# Patient Record
Sex: Male | Born: 1960
Health system: Southern US, Community
[De-identification: ages and names within clinical notes are randomized; demographics above are authoritative.]

## PROBLEM LIST (undated history)

## (undated) DIAGNOSIS — M25562 Pain in left knee: Secondary | ICD-10-CM

## (undated) DIAGNOSIS — M5136 Other intervertebral disc degeneration, lumbar region: Secondary | ICD-10-CM

## (undated) DIAGNOSIS — G4730 Sleep apnea, unspecified: Secondary | ICD-10-CM

## (undated) DIAGNOSIS — M25559 Pain in unspecified hip: Secondary | ICD-10-CM

## (undated) DIAGNOSIS — M51369 Other intervertebral disc degeneration, lumbar region without mention of lumbar back pain or lower extremity pain: Secondary | ICD-10-CM

## (undated) DIAGNOSIS — M5431 Sciatica, right side: Secondary | ICD-10-CM

## (undated) DIAGNOSIS — Z9884 Bariatric surgery status: Secondary | ICD-10-CM

## (undated) DIAGNOSIS — G8929 Other chronic pain: Secondary | ICD-10-CM

## (undated) DIAGNOSIS — G4700 Insomnia, unspecified: Secondary | ICD-10-CM

## (undated) DIAGNOSIS — E785 Hyperlipidemia, unspecified: Secondary | ICD-10-CM

## (undated) DIAGNOSIS — I10 Essential (primary) hypertension: Secondary | ICD-10-CM

## (undated) DIAGNOSIS — E119 Type 2 diabetes mellitus without complications: Secondary | ICD-10-CM

## (undated) DIAGNOSIS — M549 Dorsalgia, unspecified: Secondary | ICD-10-CM

## (undated) DIAGNOSIS — E559 Vitamin D deficiency, unspecified: Secondary | ICD-10-CM

## (undated) HISTORY — DX: Type 2 diabetes mellitus without complications: E11.9

## (undated) HISTORY — DX: Dorsalgia, unspecified: M54.9

## (undated) HISTORY — DX: Hyperlipidemia, unspecified: E78.5

## (undated) HISTORY — DX: Vitamin D deficiency, unspecified: E55.9

## (undated) HISTORY — DX: Insomnia, unspecified: G47.00

## (undated) HISTORY — DX: Other intervertebral disc degeneration, lumbar region: M51.36

## (undated) HISTORY — DX: Essential (primary) hypertension: I10

## (undated) HISTORY — PX: OTHER SURGICAL HISTORY: SHX169

## (undated) HISTORY — DX: Other intervertebral disc degeneration, lumbar region without mention of lumbar back pain or lower extremity pain: M51.369

## (undated) HISTORY — DX: Pain in left knee: M25.562

## (undated) HISTORY — DX: Other chronic pain: G89.29

## (undated) HISTORY — DX: Pain in unspecified hip: M25.559

---

## 2012-02-05 DIAGNOSIS — K861 Other chronic pancreatitis: Secondary | ICD-10-CM | POA: Insufficient documentation

## 2012-02-05 DIAGNOSIS — E119 Type 2 diabetes mellitus without complications: Secondary | ICD-10-CM | POA: Insufficient documentation

## 2012-02-12 DIAGNOSIS — I509 Heart failure, unspecified: Secondary | ICD-10-CM | POA: Insufficient documentation

## 2012-06-04 DIAGNOSIS — M48061 Spinal stenosis, lumbar region without neurogenic claudication: Secondary | ICD-10-CM | POA: Insufficient documentation

## 2013-10-06 DIAGNOSIS — Z9889 Other specified postprocedural states: Secondary | ICD-10-CM | POA: Insufficient documentation

## 2013-12-16 DIAGNOSIS — N529 Male erectile dysfunction, unspecified: Secondary | ICD-10-CM | POA: Insufficient documentation

## 2014-02-05 DIAGNOSIS — G4733 Obstructive sleep apnea (adult) (pediatric): Secondary | ICD-10-CM | POA: Insufficient documentation

## 2014-05-08 DIAGNOSIS — M5416 Radiculopathy, lumbar region: Secondary | ICD-10-CM | POA: Insufficient documentation

## 2014-05-25 DIAGNOSIS — I6381 Other cerebral infarction due to occlusion or stenosis of small artery: Secondary | ICD-10-CM | POA: Insufficient documentation

## 2014-06-16 DIAGNOSIS — M2010 Hallux valgus (acquired), unspecified foot: Secondary | ICD-10-CM | POA: Insufficient documentation

## 2014-06-16 DIAGNOSIS — B351 Tinea unguium: Secondary | ICD-10-CM | POA: Insufficient documentation

## 2014-06-16 DIAGNOSIS — L602 Onychogryphosis: Secondary | ICD-10-CM | POA: Insufficient documentation

## 2014-06-16 DIAGNOSIS — M21619 Bunion of unspecified foot: Secondary | ICD-10-CM

## 2014-06-16 DIAGNOSIS — M79673 Pain in unspecified foot: Secondary | ICD-10-CM | POA: Insufficient documentation

## 2014-06-19 HISTORY — PX: JOINT REPLACEMENT: SHX530

## 2015-02-01 DIAGNOSIS — M169 Osteoarthritis of hip, unspecified: Secondary | ICD-10-CM | POA: Insufficient documentation

## 2015-02-26 DIAGNOSIS — M2340 Loose body in knee, unspecified knee: Secondary | ICD-10-CM | POA: Insufficient documentation

## 2015-04-27 ENCOUNTER — Ambulatory Visit (INDEPENDENT_AMBULATORY_CARE_PROVIDER_SITE_OTHER): Payer: Medicare Other | Admitting: Family Medicine

## 2015-04-27 ENCOUNTER — Encounter: Payer: Self-pay | Admitting: Family Medicine

## 2015-04-27 VITALS — BP 116/74 | HR 65 | Temp 97.9°F | Ht 72.3 in | Wt 279.0 lb

## 2015-04-27 DIAGNOSIS — M5136 Other intervertebral disc degeneration, lumbar region: Secondary | ICD-10-CM

## 2015-04-27 DIAGNOSIS — E1165 Type 2 diabetes mellitus with hyperglycemia: Secondary | ICD-10-CM | POA: Diagnosis not present

## 2015-04-27 DIAGNOSIS — R202 Paresthesia of skin: Secondary | ICD-10-CM | POA: Diagnosis not present

## 2015-04-27 DIAGNOSIS — E669 Obesity, unspecified: Secondary | ICD-10-CM | POA: Insufficient documentation

## 2015-04-27 DIAGNOSIS — M25561 Pain in right knee: Secondary | ICD-10-CM | POA: Insufficient documentation

## 2015-04-27 DIAGNOSIS — E785 Hyperlipidemia, unspecified: Secondary | ICD-10-CM | POA: Insufficient documentation

## 2015-04-27 DIAGNOSIS — M25562 Pain in left knee: Secondary | ICD-10-CM

## 2015-04-27 DIAGNOSIS — D72829 Elevated white blood cell count, unspecified: Secondary | ICD-10-CM

## 2015-04-27 DIAGNOSIS — I10 Essential (primary) hypertension: Secondary | ICD-10-CM | POA: Diagnosis not present

## 2015-04-27 LAB — BAYER DCA HB A1C WAIVED: HB A1C: 7 % — AB (ref <7.0)

## 2015-04-27 LAB — MICROALBUMIN, URINE WAIVED
Creatinine, Urine Waived: 200 mg/dL (ref 10–300)
Microalb, Ur Waived: 10 mg/L (ref 0–19)
Microalb/Creat Ratio: <30 mg/g (ref <30)

## 2015-04-27 NOTE — Assessment & Plan Note (Signed)
Checking levels. Await results. Continue current regimen.

## 2015-04-27 NOTE — Assessment & Plan Note (Signed)
Continue diet and exercise. Continue to monitor.  

## 2015-04-27 NOTE — Assessment & Plan Note (Signed)
A1c came back today at 7.0! Continue current regimen. Keep up the good work. Recheck in 3 months.

## 2015-04-27 NOTE — Progress Notes (Signed)
BP 116/74 mmHg  Pulse 65  Temp(Src) 97.9 F (36.6 C)  Ht 6' 0.3" (1.836 m)  Wt 279 lb (126.554 kg)  BMI 37.54 kg/m2  SpO2 98%   Subjective:    Patient ID: Rickey Glover, male    DOB: 1960/10/21, 54 y.o.   MRN: [id number]  HPI: Rickey Glover is a 54 y.o. male  Chief Complaint  Patient presents with  . Back Pain    Patient would like to be sent to a specialist, he had all of the surgeries scheduled but since he moved he has to start over  . Diabetes   HYPERTENSION / HYPERLIPIDEMIA- has had both for a long time Satisfied with current treatment? 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 Had been seeing spine specialist, orthopedist- had been getting set up with them, but hadn't seen them yet Duration: chronic, fell down some steps many years ago Mechanism of injury: Fall many years ago Location: R>L, bilateral and low back Onset: gradual Severity: severe Quality: dull and aching Frequency: constant Radiation: R leg below the knee and L leg below the knee Aggravating factors: lifting, movement, walking and prolonged  sitting Alleviating factors: rest, ice, heat and narcotics Status: stable Treatments attempted: rest, ice, heat, APAP, ibuprofen, aleve and physical therapy  Relief with NSAIDs?: no Nighttime pain:  yes Paresthesias / decreased sensation:  yes Bowel / bladder incontinence:  no Fevers:  no Dysuria / urinary frequency:  no  Relevant past medical, surgical, family and social history reviewed and updated as indicated. Interim medical history since our last visit reviewed. Allergies and medications reviewed and updated.  Review of Systems  Respiratory: Negative.   Cardiovascular: Negative.   Gastrointestinal: Negative.   Musculoskeletal: Positive for myalgias, back pain and gait problem. Negative for joint swelling, arthralgias, neck pain and neck stiffness.  Neurological: Positive for weakness and numbness. Negative for dizziness, tremors, seizures, syncope, facial asymmetry, speech difficulty, light-headedness and headaches.  Psychiatric/Behavioral: Negative.     Per HPI unless specifically indicated above     Objective:    BP 116/74 mmHg  Pulse 65  Temp(Src) 97.9 F (36.6 C)  Ht 6' 0.3" (1.836 m)  Wt 279 lb (126.554 kg)  BMI 37.54 kg/m2  SpO2 98%  Wt Readings from Last 3 Encounters:  04/27/15 279 lb (126.554 kg)    Physical Exam  Constitutional: He is oriented to person, place, and time. He appears well-developed and well-nourished. No distress.  HENT:  Head: Normocephalic and atraumatic.  Right Ear: Hearing normal.  Left Ear: Hearing normal.  Nose: Nose normal.  Eyes: Conjunctivae, EOM and lids are normal. Pupils are equal, round, and reactive to light. Right eye exhibits no discharge. Left eye exhibits no discharge. No scleral icterus.  Cardiovascular: Normal rate, regular rhythm, normal heart sounds  and intact distal pulses.  Exam reveals no gallop and no friction rub.   No murmur heard. Pulmonary/Chest: Effort normal and breath sounds normal. No respiratory  distress. He has no wheezes. He has no rales. He exhibits no tenderness.  Musculoskeletal: Normal range of motion.  Neurological: He is alert and oriented to person, place, and time. He has normal reflexes. He displays normal reflexes. No cranial nerve deficit. He exhibits normal muscle tone. Coordination normal.  Skin: Skin is warm, dry and intact. No rash noted. No erythema. No pallor.  Psychiatric: He has a normal mood and affect. His speech is normal and behavior is normal. Judgment and thought content normal. Cognition and memory are normal.  Nursing note and vitals reviewed. Back Exam:    Inspection:  Normal spinal curvature.  No deformity, ecchymosis, erythema, or lesions     Palpation:     Midline spinal tenderness: no      Paralumbar tenderness: yes R>L     Parathoracic tenderness: no      Buttocks tenderness: yesR>L     Range of Motion:      Flexion: Fingers to Knees     Extension:Decreased     Lateral bending:Decreased    Rotation:Decreased    Neuro Exam:Lower extremity DTRs normal & symmetric.  Strength and sensation intact.    Special Tests:      Straight leg raise:positive on the R   No results found for this or any previous visit.    Assessment & Plan:   Problem List Items Addressed This Visit      Cardiovascular and Mediastinum   Essential hypertension    Well controlled today. Continue current regimen. Continue to monitor.       Relevant Medications   spironolactone (ALDACTONE) 50 MG tablet   quinapril (ACCUPRIL) 40 MG tablet   furosemide (LASIX) 40 MG tablet   carvedilol (COREG) 12.5 MG tablet   amLODipine (NORVASC) 10 MG tablet   atorvastatin (LIPITOR) 80 MG tablet   Other Relevant Orders   Comprehensive metabolic panel   Microalbumin, Urine Waived     Endocrine   Type 2 diabetes mellitus with hyperglycemia, without long-term current use of insulin (HCC) - Primary    A1c came back today at 7.0! Continue current regimen. Keep up the good work. Recheck  in 3 months.       Relevant Medications   quinapril (ACCUPRIL) 40 MG tablet   metFORMIN (GLUCOPHAGE-XR) 500 MG 24 hr tablet   atorvastatin (LIPITOR) 80 MG tablet   Other Relevant Orders   Ambulatory referral to Ophthalmology   Ambulatory referral to Podiatry   CBC with Differential/Platelet   Comprehensive metabolic panel   Bayer DCA Hb A1c Waived     Musculoskeletal and Integument   DDD (degenerative disc disease), lumbar    Referral to spine doctor made today. Will obtain records from his previous doctor. Discussed that I cannot prescribe controlled substances on the first visit.       Relevant Medications   HYDROcodone-acetaminophen (NORCO) 10-325 MG tablet   naproxen (NAPROSYN) 500 MG tablet   Other Relevant Orders   Ambulatory referral to Orthopedic Surgery   Ambulatory referral to Spine Surgery     Other   Left knee pain    Has arthritis. Was to have knee replacement before he moved. Referral to orthopedics made. Obtain records from previous provider      Relevant Orders   Ambulatory referral to Orthopedic Surgery   HLD (hyperlipidemia)  Checking levels. Await results. Continue current regimen.       Relevant Medications   spironolactone (ALDACTONE) 50 MG tablet   quinapril (ACCUPRIL) 40 MG tablet   furosemide (LASIX) 40 MG tablet   carvedilol (COREG) 12.5 MG tablet   amLODipine (NORVASC) 10 MG tablet   atorvastatin (LIPITOR) 80 MG tablet   Other Relevant Orders   Comprehensive metabolic panel   Lipid Panel w/o Chol/HDL Ratio   Obesity    Continue diet and exercise. Continue to monitor.       Relevant Medications   metFORMIN (GLUCOPHAGE-XR) 500 MG 24 hr tablet   Other Relevant Orders   TSH    Other Visit Diagnoses    Paresthesias        Will check lab work and see what previous records show.     Relevant Orders    Ambulatory referral to Spine Surgery    TSH        Follow up plan: Return for Records Release, 1 month follow up.

## 2015-04-27 NOTE — Assessment & Plan Note (Signed)
Referral to spine doctor made today. Will obtain records from his previous doctor. Discussed that I cannot prescribe controlled substances on the first visit.

## 2015-04-27 NOTE — Assessment & Plan Note (Signed)
Has arthritis. Was to have knee replacement before he moved. Referral to orthopedics made. Obtain records from previous provider

## 2015-04-27 NOTE — Assessment & Plan Note (Signed)
Well controlled today. Continue current regimen. Continue to monitor.

## 2015-04-28 LAB — CBC WITH DIFFERENTIAL/PLATELET
BASOS ABS: 0 10*3/uL (ref 0.0–0.2)
Basos: 0 %
EOS (ABSOLUTE): 0.3 10*3/uL (ref 0.0–0.4)
Eos: 2 %
HEMATOCRIT: 40.9 % (ref 37.5–51.0)
Hemoglobin: 14.1 g/dL (ref 12.6–17.7)
Immature Grans (Abs): 0 10*3/uL (ref 0.0–0.1)
Immature Granulocytes: 0 %
LYMPHS: 30 %
Lymphocytes Absolute: 3.6 10*3/uL — ABNORMAL HIGH (ref 0.7–3.1)
MCH: 29.9 pg (ref 26.6–33.0)
MCHC: 34.5 g/dL (ref 31.5–35.7)
MCV: 87 fL (ref 79–97)
MONOS ABS: 0.6 10*3/uL (ref 0.1–0.9)
Monocytes: 5 %
NEUTROS ABS: 7.4 10*3/uL — AB (ref 1.4–7.0)
NEUTROS PCT: 63 %
Platelets: 280 10*3/uL (ref 150–379)
RBC: 4.72 x10E6/uL (ref 4.14–5.80)
RDW: 13.9 % (ref 12.3–15.4)
WBC: 12 10*3/uL — ABNORMAL HIGH (ref 3.4–10.8)

## 2015-04-28 LAB — COMPREHENSIVE METABOLIC PANEL
A/G RATIO: 2.1 (ref 1.1–2.5)
ALK PHOS: 64 IU/L (ref 39–117)
ALT: 10 IU/L (ref 0–44)
AST: 11 IU/L (ref 0–40)
Albumin: 4.5 g/dL (ref 3.5–5.5)
BILIRUBIN TOTAL: 0.5 mg/dL (ref 0.0–1.2)
BUN/Creatinine Ratio: 15 (ref 9–20)
BUN: 15 mg/dL (ref 6–24)
CALCIUM: 9.5 mg/dL (ref 8.7–10.2)
CHLORIDE: 103 mmol/L (ref 97–106)
CO2: 23 mmol/L (ref 18–29)
Creatinine, Ser: 1.03 mg/dL (ref 0.76–1.27)
GFR calc Af Amer: 96 mL/min/{1.73_m2} (ref >59)
GFR, EST NON AFRICAN AMERICAN: 83 mL/min/{1.73_m2} (ref >59)
GLOBULIN, TOTAL: 2.1 g/dL (ref 1.5–4.5)
Glucose: 160 mg/dL — ABNORMAL HIGH (ref 65–99)
POTASSIUM: 4.4 mmol/L (ref 3.5–5.2)
SODIUM: 143 mmol/L (ref 136–144)
Total Protein: 6.6 g/dL (ref 6.0–8.5)

## 2015-04-28 LAB — LIPID PANEL W/O CHOL/HDL RATIO
Cholesterol, Total: 172 mg/dL (ref 100–199)
HDL: 43 mg/dL (ref >39)
LDL Calculated: 98 mg/dL (ref 0–99)
TRIGLYCERIDES: 154 mg/dL — AB (ref 0–149)
VLDL Cholesterol Cal: 31 mg/dL (ref 5–40)

## 2015-04-28 LAB — TSH: TSH: 0.932 u[IU]/mL (ref 0.450–4.500)

## 2015-04-29 ENCOUNTER — Encounter: Payer: Self-pay | Admitting: Family Medicine

## 2015-04-29 DIAGNOSIS — D72829 Elevated white blood cell count, unspecified: Secondary | ICD-10-CM | POA: Insufficient documentation

## 2015-04-29 NOTE — Addendum Note (Signed)
Addended by: Valerie Roys on: 04/29/2015 12:10 PM   Modules accepted: Orders

## 2015-04-30 ENCOUNTER — Encounter: Payer: Self-pay | Admitting: Family Medicine

## 2015-04-30 ENCOUNTER — Ambulatory Visit (INDEPENDENT_AMBULATORY_CARE_PROVIDER_SITE_OTHER): Payer: Medicare Other | Admitting: Family Medicine

## 2015-04-30 ENCOUNTER — Encounter (INDEPENDENT_AMBULATORY_CARE_PROVIDER_SITE_OTHER): Payer: Self-pay

## 2015-04-30 VITALS — BP 115/72 | HR 73 | Temp 97.6°F | Ht 73.0 in | Wt 282.4 lb

## 2015-04-30 DIAGNOSIS — M5136 Other intervertebral disc degeneration, lumbar region: Secondary | ICD-10-CM

## 2015-04-30 DIAGNOSIS — G4700 Insomnia, unspecified: Secondary | ICD-10-CM

## 2015-04-30 MED ORDER — HYDROCODONE-ACETAMINOPHEN 10-325 MG PO TABS: 1.0000 | ORAL_TABLET | Freq: Four times a day (QID) | ORAL | Status: DC | PRN

## 2015-04-30 MED ORDER — CLONAZEPAM 0.5 MG PO TABS: 0.5000 mg | ORAL_TABLET | Freq: Every day | ORAL | Status: DC

## 2015-04-30 NOTE — Assessment & Plan Note (Signed)
Records from [place] reviewed in care everywhere. Records in PMP reviewed and appropriate. Chronic lumbar radiculopathy. Has been referred to spine surgery already. We will take over his norco for now. Will not be increased. Controlled substance agreement signed today. Plan on continuing medication until plan with orthopedics established. He is aware. Rx written today.

## 2015-04-30 NOTE — Progress Notes (Signed)
BP 115/72 mmHg  Pulse 73  Temp(Src) 97.6 F (36.4 C)  Ht 6\' 1"  (1.854 m)  Wt 282 lb 6.4 oz (128.096 kg)  BMI 37.27 kg/m2  SpO2 97%   Subjective:    Patient ID: Rickey Glover, male    DOB: March 18, 1961, 54 y.o.   MRN: [id number]  HPI: Eriberto Glover is a 54 y.o. male  Chief Complaint  Patient presents with  . Follow-up    Medication   INSOMNIA- didn't like how Lorrin Mais made him feel. Started on xanax, but warned that it is very addictive. Not doing good sleep hygiene. Taking the xanax, but needed to start taking 2 of them a night Duration: chronic- about 10 years or so Satisfied with sleep quality: no Difficulty falling asleep: no Difficulty staying asleep: yes Waking a few hours after sleep onset: yes Early morning awakenings: yes Daytime hypersomnolence: yes Wakes feeling refreshed: no Good sleep hygiene: yes Apnea: yes Snoring: yes Depressed/anxious mood: no Recent stress: yes Restless legs/nocturnal leg cramps: no Chronic pain/arthritis: yes History of sleep study: no Treatments attempted: melatonin, uinsom, benadryl and ambien, trazodone   BACK PAIN- has known DDD in his back and into his hip and needs surgery on the R knee. Had been seeing spine specialist, orthopedist- had been getting set up with them, but hadn't seen them yet Duration: chronic, fell down some steps many years ago Mechanism of injury: Fall many years ago Location: R>L, bilateral and low back Onset: gradual Severity: severe Quality: dull and aching Frequency: constant Radiation: R leg below the knee and L leg below the knee Aggravating factors: lifting, movement, walking and prolonged sitting Alleviating factors: rest, ice, heat and narcotics Status: stable Treatments attempted: rest, ice, heat, APAP, ibuprofen, aleve and physical therapy  Relief with NSAIDs?: no Nighttime pain: yes Paresthesias / decreased sensation: yes Bowel / bladder incontinence: no Fevers: no Dysuria /  urinary frequency: no  Relevant past medical, surgical, family and social history reviewed and updated as indicated. Interim medical history since our last visit reviewed. Allergies and medications reviewed and updated.  Review of Systems  Constitutional: Negative.   Respiratory: Negative.   Cardiovascular: Negative.   Gastrointestinal: Negative.   Musculoskeletal: Positive for myalgias, back pain, joint swelling, arthralgias, gait problem, neck pain and neck stiffness.  Psychiatric/Behavioral: Positive for sleep disturbance and decreased concentration. Negative for suicidal ideas, hallucinations, behavioral problems, confusion, self-injury, dysphoric mood and agitation. The patient is not nervous/anxious and is not hyperactive.     Per HPI unless specifically indicated above     Objective:    BP 115/72 mmHg  Pulse 73  Temp(Src) 97.6 F (36.4 C)  Ht 6\' 1"  (1.854 m)  Wt 282 lb 6.4 oz (128.096 kg)  BMI 37.27 kg/m2  SpO2 97%  Wt Readings from Last 3 Encounters:  04/30/15 282 lb 6.4 oz (128.096 kg)  04/27/15 279 lb (126.554 kg)    Physical Exam  Constitutional: He is oriented to person, place, and time. He appears well-developed and well-nourished. No distress.  HENT:  Head: Normocephalic and atraumatic.  Right Ear: Hearing normal.  Left Ear: Hearing normal.  Nose: Nose normal.  Eyes: Conjunctivae and lids are normal. Right eye exhibits no discharge. Left eye exhibits no discharge. No scleral icterus.  Cardiovascular: Normal rate, regular rhythm, normal heart sounds and intact distal pulses.  Exam reveals no gallop and no friction rub.   No murmur heard. Pulmonary/Chest: Effort normal and breath sounds normal. No respiratory distress. He has no wheezes. He  has no rales. He exhibits no tenderness.  Musculoskeletal: Normal range of motion.  Neurological: He is alert and oriented to person, place, and time. He displays normal reflexes. No cranial nerve deficit. He exhibits normal  muscle tone. Coordination normal.  Skin: Skin is warm, dry and intact. No rash noted. No erythema. No pallor.  Psychiatric: He has a normal mood and affect. His speech is normal and behavior is normal. Judgment and thought content normal. Cognition and memory are normal.  Nursing note and vitals reviewed.   Results for orders placed or performed in visit on 04/27/15  CBC with Differential/Platelet  Result Value Ref Range   WBC 12.0 (H) 3.4 - 10.8 x10E3/uL   RBC 4.72 4.14 - 5.80 x10E6/uL   Hemoglobin 14.1 12.6 - 17.7 g/dL   Hematocrit 40.9 37.5 - 51.0 %   MCV 87 79 - 97 fL   MCH 29.9 26.6 - 33.0 pg   MCHC 34.5 31.5 - 35.7 g/dL   RDW 13.9 12.3 - 15.4 %   Platelets 280 150 - 379 x10E3/uL   Neutrophils 63 %   Lymphs 30 %   Monocytes 5 %   Eos 2 %   Basos 0 %   Neutrophils Absolute 7.4 (H) 1.4 - 7.0 x10E3/uL   Lymphocytes Absolute 3.6 (H) 0.7 - 3.1 x10E3/uL   Monocytes Absolute 0.6 0.1 - 0.9 x10E3/uL   EOS (ABSOLUTE) 0.3 0.0 - 0.4 x10E3/uL   Basophils Absolute 0.0 0.0 - 0.2 x10E3/uL   Immature Granulocytes 0 %   Immature Grans (Abs) 0.0 0.0 - 0.1 x10E3/uL  Comprehensive metabolic panel  Result Value Ref Range   Glucose 160 (H) 65 - 99 mg/dL   BUN 15 6 - 24 mg/dL   Creatinine, Ser 1.03 0.76 - 1.27 mg/dL   GFR calc non Af Amer 83 >59 mL/min/1.73   GFR calc Af Amer 96 >59 mL/min/1.73   BUN/Creatinine Ratio 15 9 - 20   Sodium 143 136 - 144 mmol/L   Potassium 4.4 3.5 - 5.2 mmol/L   Chloride 103 97 - 106 mmol/L   CO2 23 18 - 29 mmol/L   Calcium 9.5 8.7 - 10.2 mg/dL   Total Protein 6.6 6.0 - 8.5 g/dL   Albumin 4.5 3.5 - 5.5 g/dL   Globulin, Total 2.1 1.5 - 4.5 g/dL   Albumin/Globulin Ratio 2.1 1.1 - 2.5   Bilirubin Total 0.5 0.0 - 1.2 mg/dL   Alkaline Phosphatase 64 39 - 117 IU/L   AST 11 0 - 40 IU/L   ALT 10 0 - 44 IU/L  Bayer DCA Hb A1c Waived  Result Value Ref Range   Bayer DCA Hb A1c Waived 7.0 (H) <7.0 %  Lipid Panel w/o Chol/HDL Ratio  Result Value Ref Range    Cholesterol, Total 172 100 - 199 mg/dL   Triglycerides 154 (H) 0 - 149 mg/dL   HDL 43 >39 mg/dL   VLDL Cholesterol Cal 31 5 - 40 mg/dL   LDL Calculated 98 0 - 99 mg/dL  Microalbumin, Urine Waived  Result Value Ref Range   Microalb, Ur Waived 10 0 - 19 mg/L   Creatinine, Urine Waived 200 10 - 300 mg/dL   Microalb/Creat Ratio <30 <30 mg/g  TSH  Result Value Ref Range   TSH 0.932 0.450 - 4.500 uIU/mL      Prescription monitoring program reviewed and appropriate. Reference   Assessment & Plan:   Problem List Items Addressed This Visit      Musculoskeletal  and Integument   DDD (degenerative disc disease), lumbar    Records from [place] reviewed in care everywhere. Records in PMP reviewed and appropriate. Chronic lumbar radiculopathy. Has been referred to spine surgery already. We will take over his norco for now. Will not be increased. Controlled substance agreement signed today. Plan on continuing medication until plan with orthopedics established. He is aware. Rx written today.        Other   Insomnia - Primary    Has tried just about everything per records. Records at [facility] reviewed through care everywhere. Has been increasing his xanax at night to help sleep. Concern about tolerance. Will change to klonapin for slower release and hopefully less concern about addiction. Discussed that he should try to take it only when he really needs it as it can still be very addictive. Work on Publishing rights manager. Controlled substance agreement signed today. Rx given. Follow up in 1 month.           Follow up plan: Return in about 4 weeks (around 05/28/2015) for follow up insomnia and back/knee pain.

## 2015-04-30 NOTE — Assessment & Plan Note (Signed)
Has tried just about everything per records. Records at [facility] reviewed through care everywhere. Has been increasing his xanax at night to help sleep. Concern about tolerance. Will change to klonapin for slower release and hopefully less concern about addiction. Discussed that he should try to take it only when he really needs it as it can still be very addictive. Work on Publishing rights manager. Controlled substance agreement signed today. Rx given. Follow up in 1 month.

## 2015-05-04 DIAGNOSIS — M1712 Unilateral primary osteoarthritis, left knee: Secondary | ICD-10-CM | POA: Diagnosis not present

## 2015-05-04 DIAGNOSIS — M1611 Unilateral primary osteoarthritis, right hip: Secondary | ICD-10-CM | POA: Diagnosis not present

## 2015-05-04 DIAGNOSIS — M1711 Unilateral primary osteoarthritis, right knee: Secondary | ICD-10-CM | POA: Diagnosis not present

## 2015-05-25 ENCOUNTER — Ambulatory Visit: Payer: Self-pay | Admitting: Sports Medicine

## 2015-05-26 ENCOUNTER — Ambulatory Visit: Payer: Medicare Other | Admitting: Family Medicine

## 2015-05-27 ENCOUNTER — Encounter: Payer: Self-pay | Admitting: Family Medicine

## 2015-05-27 ENCOUNTER — Ambulatory Visit (INDEPENDENT_AMBULATORY_CARE_PROVIDER_SITE_OTHER): Payer: Medicare Other | Admitting: Family Medicine

## 2015-05-27 VITALS — BP 116/78 | HR 69 | Temp 98.6°F | Wt 284.0 lb

## 2015-05-27 DIAGNOSIS — G4700 Insomnia, unspecified: Secondary | ICD-10-CM | POA: Diagnosis not present

## 2015-05-27 DIAGNOSIS — M1711 Unilateral primary osteoarthritis, right knee: Secondary | ICD-10-CM | POA: Diagnosis not present

## 2015-05-27 DIAGNOSIS — M5136 Other intervertebral disc degeneration, lumbar region: Secondary | ICD-10-CM

## 2015-05-27 DIAGNOSIS — M1611 Unilateral primary osteoarthritis, right hip: Secondary | ICD-10-CM | POA: Diagnosis not present

## 2015-05-27 DIAGNOSIS — G453 Amaurosis fugax: Secondary | ICD-10-CM | POA: Insufficient documentation

## 2015-05-27 DIAGNOSIS — M1712 Unilateral primary osteoarthritis, left knee: Secondary | ICD-10-CM | POA: Diagnosis not present

## 2015-05-27 MED ORDER — ASPIRIN-DIPYRIDAMOLE ER 25-200 MG PO CP12: ORAL_CAPSULE | ORAL | Status: DC

## 2015-05-27 MED ORDER — HYDROCODONE-ACETAMINOPHEN 10-325 MG PO TABS: 1.0000 | ORAL_TABLET | Freq: Four times a day (QID) | ORAL | Status: DC | PRN

## 2015-05-27 NOTE — Progress Notes (Signed)
BP 116/78 mmHg  Pulse 69  Temp(Src) 98.6 F (37 C)  Wt 284 lb (128.822 kg)  SpO2 98%   Subjective:    Patient ID: Rickey Glover, male    DOB: April 26, 1961, 54 y.o.   MRN: [id number]  HPI: Rickey Glover is a 54 y.o. male  Chief Complaint  Patient presents with  . Insomnia    Patient needs a refill on his klonopin  . Back Pain    Patient states that he is having to take 2 or 3 pain pills a day to be able to function  . Medication Refill    Aggrenox   Having problems with his feet and his hands going numb for se Hasn't seen the spine specialist yet- thinks that he is seeing him today  Saw the orthopedist and got a cortisone shot in his knee and that is helping a lot. Knee is not hurting any more  CHRONIC PAIN- ran out of his pain medicine yesterday. It helps, but doesn't make it go away 100%  Present dose: 40 Morphine equivalents Pain control status: stable Duration: chronic Location: low back Quality: sharp, dull, aching and burning Current Pain Level: moderate Previous Pain Level: severe Breakthrough pain: yes Benefit from narcotic medications: yes What Activities task can be accomplished with current medication?: can take care of his grand daughter Interested in weaning off narcotics:no   Stool softners/OTC fiber: no  Previous pain specialty evaluation: no Non-narcotic analgesic meds: yes Narcotic contract: yes  INSOMNIA- has been taking the sleep medicine with benadryl and that is working, 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 Interim medical history since our last visit reviewed. Allergies and medications reviewed and updated.  Review of Systems  Constitutional: Negative.   Respiratory: Negative.   Cardiovascular: Negative.   Musculoskeletal: Positive for myalgias, back pain and arthralgias. Negative for joint swelling, gait problem, neck pain and neck stiffness.  Neurological: Negative.   Psychiatric/Behavioral: Positive for sleep disturbance. Negative for suicidal ideas, hallucinations, behavioral problems, confusion, self-injury, dysphoric mood, decreased concentration and agitation. The patient is not nervous/anxious and is not hyperactive.     Per HPI unless specifically indicated above     Objective:    BP 116/78 mmHg  Pulse 69  Temp(Src) 98.6 F (37 C)  Wt 284 lb (128.822 kg)  SpO2 98%  Wt Readings from Last 3 Encounters:  05/27/15 284 lb (128.822 kg)  04/30/15 282 lb 6.4 oz (128.096 kg)  04/27/15 279 lb (126.554 kg)    Physical Exam  Constitutional: He is oriented to person, place, and time. He appears well-developed and well-nourished. No distress.  HENT:  Head: Normocephalic and atraumatic.  Right Ear: Hearing normal.  Left Ear: Hearing normal.  Nose: Nose normal.  Eyes: Conjunctivae and lids are normal. Right eye exhibits no discharge. Left eye exhibits no discharge. No scleral icterus.  Cardiovascular: Normal rate, regular rhythm, normal heart sounds and intact distal pulses.  Exam reveals no gallop and no friction rub.   No murmur heard. Pulmonary/Chest: Effort normal and  breath sounds normal. No respiratory distress. He has no wheezes. He has no rales. He exhibits no tenderness.  Musculoskeletal: Normal range of motion.  Neurological: He is alert and oriented to person, place, and time.  Skin: Skin is warm, dry and intact. No rash  noted. No erythema. No pallor.  Psychiatric: He has a normal mood and affect. His speech is normal and behavior is normal. Judgment and thought content normal. Cognition and memory are normal.  Nursing note and vitals reviewed.   Results for orders placed or performed in visit on 04/27/15  CBC with Differential/Platelet  Result Value Ref Range   WBC 12.0 (H) 3.4 - 10.8 x10E3/uL   RBC 4.72 4.14 - 5.80 x10E6/uL   Hemoglobin 14.1 12.6 - 17.7 g/dL   Hematocrit 40.9 37.5 - 51.0 %   MCV 87 79 - 97 fL   MCH 29.9 26.6 - 33.0 pg   MCHC 34.5 31.5 - 35.7 g/dL   RDW 13.9 12.3 - 15.4 %   Platelets 280 150 - 379 x10E3/uL   Neutrophils 63 %   Lymphs 30 %   Monocytes 5 %   Eos 2 %   Basos 0 %   Neutrophils Absolute 7.4 (H) 1.4 - 7.0 x10E3/uL   Lymphocytes Absolute 3.6 (H) 0.7 - 3.1 x10E3/uL   Monocytes Absolute 0.6 0.1 - 0.9 x10E3/uL   EOS (ABSOLUTE) 0.3 0.0 - 0.4 x10E3/uL   Basophils Absolute 0.0 0.0 - 0.2 x10E3/uL   Immature Granulocytes 0 %   Immature Grans (Abs) 0.0 0.0 - 0.1 x10E3/uL  Comprehensive metabolic panel  Result Value Ref Range   Glucose 160 (H) 65 - 99 mg/dL   BUN 15 6 - 24 mg/dL   Creatinine, Ser 1.03 0.76 - 1.27 mg/dL   GFR calc non Af Amer 83 >59 mL/min/1.73   GFR calc Af Amer 96 >59 mL/min/1.73   BUN/Creatinine Ratio 15 9 - 20   Sodium 143 136 - 144 mmol/L   Potassium 4.4 3.5 - 5.2 mmol/L   Chloride 103 97 - 106 mmol/L   CO2 23 18 - 29 mmol/L   Calcium 9.5 8.7 - 10.2 mg/dL   Total Protein 6.6 6.0 - 8.5 g/dL   Albumin 4.5 3.5 - 5.5 g/dL   Globulin, Total 2.1 1.5 - 4.5 g/dL   Albumin/Globulin Ratio 2.1 1.1 - 2.5   Bilirubin Total 0.5 0.0 - 1.2 mg/dL   Alkaline Phosphatase 64 39 - 117 IU/L   AST 11 0 - 40 IU/L   ALT 10 0 - 44 IU/L  Bayer DCA Hb A1c Waived  Result Value Ref Range   Bayer DCA Hb A1c Waived 7.0 (H) <7.0 %  Lipid Panel w/o Chol/HDL Ratio  Result Value Ref Range   Cholesterol, Total 172 100 - 199 mg/dL   Triglycerides 154 (H) 0 - 149 mg/dL   HDL  43 >39 mg/dL   VLDL Cholesterol Cal 31 5 - 40 mg/dL   LDL Calculated 98 0 - 99 mg/dL  Microalbumin, Urine Waived  Result Value Ref Range   Microalb, Ur Waived 10 0 - 19 mg/L   Creatinine, Urine Waived 200 10 - 300 mg/dL   Microalb/Creat Ratio <30 <30 mg/g  TSH  Result Value Ref Range   TSH 0.932 0.450 - 4.500 uIU/mL      Assessment & Plan:   Problem List Items Addressed This Visit      Cardiovascular and Mediastinum   Amaurosis fugax    To see neurology. Continue  on aggrenox. Still need records from previous providers. Continue to monitor.       Relevant Medications   aspirin 81 MG tablet   Other Relevant Orders   Ambulatory referral to Neurology     Musculoskeletal and Integument   DDD (degenerative disc disease), lumbar - Primary    Doing fair on his current regimen. Continue current regimen. Continue to monitor. Seeing spine surgeon today. To discuss further options. Continue to monitor.       Relevant Medications   aspirin 81 MG tablet   meloxicam (MOBIC) 15 MG tablet   HYDROcodone-acetaminophen (NORCO) 10-325 MG tablet     Other   Insomnia    Advised patient that he cannot take benadryl with klonapin. Will try just the benadryl. Will call if not working. Continue to monitor.           Follow up plan: Return in about 4 weeks (around 06/24/2015).

## 2015-05-28 NOTE — Assessment & Plan Note (Signed)
Doing fair on his current regimen. Continue current regimen. Continue to monitor. Seeing spine surgeon today. To discuss further options. Continue to monitor.

## 2015-05-28 NOTE — Assessment & Plan Note (Signed)
Advised patient that he cannot take benadryl with klonapin. Will try just the benadryl. Will call if not working. Continue to monitor.

## 2015-05-28 NOTE — Assessment & Plan Note (Signed)
To see neurology. Continue on aggrenox. Still need records from previous providers. Continue to monitor.

## 2015-06-02 ENCOUNTER — Telehealth: Payer: Self-pay | Admitting: Family Medicine

## 2015-06-02 MED ORDER — CLONAZEPAM 0.5 MG PO TABS: 0.5000 mg | ORAL_TABLET | Freq: Every day | ORAL | Status: DC

## 2015-06-02 NOTE — Telephone Encounter (Signed)
Pt needs a refill on sleeping meds(he didn't know the name of it) he would like it to go to [facility]. He also needs a letter to take to social security by Friday if possible to take to his appt with them on Monday stating that he does in fact need a hip and knee replacement.

## 2015-06-02 NOTE — Telephone Encounter (Signed)
Forward to provider

## 2015-06-02 NOTE — Telephone Encounter (Addendum)
Rx printed. He will need to come in to pick it up. Please let him know it's up front.

## 2015-06-02 NOTE — Telephone Encounter (Signed)
Patient states that the Benadryl is not working any longer, and that you told him to call if it stops working.

## 2015-06-02 NOTE — Addendum Note (Signed)
Addended by: Valerie Roys on: 06/02/2015 12:16 PM   Modules accepted: Orders

## 2015-06-02 NOTE — Telephone Encounter (Signed)
He told me at his appointment that he was going to take the benadryl because it worked better, which is why he didn't get a refill on his sleeping pill.   He should get that note from his orthopedist, as they are the ones who are going to be doing the surgery.

## 2015-06-06 ENCOUNTER — Ambulatory Visit: Payer: Medicare Other

## 2015-06-06 ENCOUNTER — Ambulatory Visit
Admission: EM | Admit: 2015-06-06 | Discharge: 2015-06-06 | Disposition: A | Discharge status: Home / Self Care | Payer: Medicare Other | Attending: Family Medicine | Admitting: Family Medicine

## 2015-06-06 ENCOUNTER — Encounter: Payer: Self-pay | Admitting: Emergency Medicine

## 2015-06-06 DIAGNOSIS — M129 Arthropathy, unspecified: Secondary | ICD-10-CM

## 2015-06-06 DIAGNOSIS — M25562 Pain in left knee: Secondary | ICD-10-CM | POA: Diagnosis not present

## 2015-06-06 DIAGNOSIS — M1712 Unilateral primary osteoarthritis, left knee: Secondary | ICD-10-CM

## 2015-06-06 HISTORY — DX: Bariatric surgery status: Z98.84

## 2015-06-06 MED ORDER — KETOROLAC TROMETHAMINE 60 MG/2ML IM SOLN
60.0000 mg | Freq: Once | INTRAMUSCULAR | Status: AC
  Administered 2015-06-06: 60 mg via INTRAMUSCULAR

## 2015-06-06 MED ORDER — NAPROXEN SODIUM 220 MG PO TABS: 220.0000 mg | ORAL_TABLET | Freq: Two times a day (BID) | ORAL | Status: DC

## 2015-06-06 NOTE — Discharge Instructions (Signed)
Cryotherapy  Cryotherapy is when you put ice on your injury. Ice helps lessen pain and puffiness (swelling) after an injury. Ice works the best when you start using it in the first 24 to 48 hours after an injury.  HOME CARE  · Put a dry or damp towel between the ice pack and your skin.  · You may press gently on the ice pack.  · Leave the ice on for no more than 10 to 20 minutes at a time.  · Check your skin after 5 minutes to make sure your skin is okay.  · Rest at least 20 minutes between ice pack uses.  · Stop using ice when your skin loses feeling (numbness).  · Do not use ice on someone who cannot tell you when it hurts. This includes small children and people with memory problems (dementia).  GET HELP RIGHT AWAY IF:  · You have white spots on your skin.  · Your skin turns blue or pale.  · Your skin feels waxy or hard.  · Your puffiness gets worse.  MAKE SURE YOU:   · Understand these instructions.  · Will watch your condition.  · Will get help right away if you are not doing well or get worse.     This information is not intended to replace advice given to you by your health care provider. Make sure you discuss any questions you have with your health care provider.     Document Released: 11/22/2007 Document Revised: 08/28/2011 Document Reviewed: 01/26/2011  Elsevier Interactive Patient Education ©2016 Elsevier Inc.

## 2015-06-06 NOTE — ED Notes (Signed)
Left knee pain started today. Knee has given out. Swollen and red.

## 2015-06-06 NOTE — ED Provider Notes (Signed)
CSN: [id number]     Arrival date & time 06/06/15  1127 History   First MD Initiated Contact with Patient 06/06/15 1256     Chief Complaint  Patient presents with  . Knee Pain   HPI  Rickey Glover is a pleasant 54 y.o. male with acute on chronic left knee pain.  He tells me he has chronic left knee pain and swelling. He went to [facility] [place] a month ago and had fluid drained off of his knee as well as a cortisone injection. He is planning on having upcoming right hip surgery. He gives history of arthritis. He states he was eating breakfast at [employer] this morning and he felt like his left knee gave out. He has had recent x-rays and MRIs done but I do not have access to them today. He is not taking any NSAIDs. He does take hydrocodone as needed for pain in his back and right hip. Pain is 9/10 on pain scale. Weightbearing makes pain worse. His range of motion is decreased as he does not have complete extension or flexion.     Past Medical History  Diagnosis Date  . Hypertension   . Diabetes mellitus without complication ([place])   . Hyperlipidemia   . LAP-BAND surgery status   . Knee pain, left    Past Surgical History  Procedure Laterality Date  . Lap band surgery     History reviewed. No pertinent family history. Social History  Substance Use Topics  . Smoking status: Current Every Day Smoker -- 1.00 packs/day  . Smokeless tobacco: Never Used  . Alcohol Use: No    Review of Systems  Constitutional: Positive for activity change.  HENT: Negative.   Eyes: Negative.   Respiratory: Negative.   Cardiovascular: Negative.   Gastrointestinal: Negative.   Musculoskeletal: Positive for back pain, joint swelling and arthralgias. Negative for myalgias.  Neurological: Negative.     Allergies  Percocet  Home Medications   Prior to Admission medications   Medication Sig Start Date End Date Taking? Authorizing Provider  amLODipine (NORVASC) 10 MG tablet Take 10 mg by  mouth daily.   Yes Historical Provider, MD  aspirin EC 81 MG tablet Take 81 mg by mouth daily.   Yes Historical Provider, MD  carvedilol (COREG) 12.5 MG tablet Take 12.5 mg by mouth 2 (two) times daily with a meal.   Yes Historical Provider, MD  clonazePAM (KLONOPIN) 0.5 MG tablet Take 0.5 mg by mouth 2 (two) times daily as needed for anxiety.   Yes Historical Provider, MD  dipyridamole-aspirin (AGGRENOX) 200-25 MG 12hr capsule Take 1 capsule by mouth 2 (two) times daily.   Yes Historical Provider, MD  furosemide (LASIX) 40 MG tablet Take 40 mg by mouth daily.   Yes Historical Provider, MD  gabapentin (NEURONTIN) 300 MG capsule Take 300 mg by mouth 3 (three) times daily.   Yes Historical Provider, MD  glimepiride (AMARYL) 4 MG tablet Take 4 mg by mouth 2 (two) times daily.   Yes Historical Provider, MD  HYDROcodone-acetaminophen (NORCO) 10-325 MG tablet Take 1 tablet by mouth every 6 (six) hours as needed.   Yes Historical Provider, MD  metFORMIN (GLUCOPHAGE) 500 MG tablet Take 500 mg by mouth.   Yes Historical Provider, MD  quinapril (ACCUPRIL) 40 MG tablet Take 40 mg by mouth at bedtime.   Yes Historical Provider, MD  spironolactone (ALDACTONE) 50 MG tablet Take 50 mg by mouth daily.   Yes Historical Provider, MD  naproxen sodium (  ALEVE) 220 MG tablet Take 1 tablet (220 mg total) by mouth 2 (two) times daily with a meal. 06/06/15   Andria Meuse, NP   Meds Ordered and Administered this Visit   Medications  ketorolac (TORADOL) injection 60 mg (60 mg Intramuscular Given 06/06/15 1313)    BP 98/66 mmHg  Pulse 69  Temp(Src) 97.2 F (36.2 C) (Tympanic)  Resp 18  Ht 6\' 1"  (1.854 m)  Wt 280 lb (127.007 kg)  BMI 36.95 kg/m2  SpO2 100% No data found.   Physical Exam  Constitutional: He is oriented to person, place, and time. He appears well-developed and well-nourished. No distress.  HENT:  Head: Normocephalic and atraumatic.  Eyes: Conjunctivae are normal. No scleral icterus.  Neck:  Normal range of motion.  Cardiovascular: Normal rate and regular rhythm.   Pulmonary/Chest: Effort normal and breath sounds normal. No respiratory distress.  Abdominal: Soft. Bowel sounds are normal. He exhibits no distension.  Musculoskeletal: He exhibits no edema or tenderness.       Left knee: He exhibits decreased range of motion and swelling. He exhibits no ecchymosis, no deformity, no erythema and no bony tenderness. No tenderness found. No medial joint line, no lateral joint line, no MCL, no LCL and no patellar tendon tenderness noted.  Unable to fully extend left knee.  Good equal strength & sensation.  Neurological: He is alert and oriented to person, place, and time. He has normal strength. No cranial nerve deficit or sensory deficit.  Skin: Skin is warm and dry. No rash noted. No erythema.  Psychiatric: His behavior is normal. Judgment normal.  Nursing note and vitals reviewed.   ED Course  Procedures na  MDM   1. Left knee pain   2. Arthritis of left knee    Patient has acute on chronic left knee pain and swelling. Recent cortisone injection at [facility].  He was given Toradol 60 mg IM injection which did seem to relieve his pain today. He is asked to follow-up with his orthopedist this week for further management. He has had both recent x-rays as well as MRI and I did not feel any further imaging would add to any management today. He is to remain nonweightbearing on that knee with crutches he has already at home.  Plan: Diagnosis reviewed with patient/parent/guardian/caregiver  Rx as per orders;  benefits, risks, potential side effects reviewed  Recommend supportive treatment with rest, ice, elevation, crutches Appt  Ortho early this week for re-evaluation   Andria Meuse, NP 06/06/15 1348

## 2015-06-07 ENCOUNTER — Encounter: Payer: Self-pay | Admitting: Diagnostic Neuroimaging

## 2015-06-07 ENCOUNTER — Ambulatory Visit (INDEPENDENT_AMBULATORY_CARE_PROVIDER_SITE_OTHER): Payer: Medicare Other | Admitting: Diagnostic Neuroimaging

## 2015-06-07 ENCOUNTER — Telehealth: Payer: Self-pay | Admitting: Diagnostic Neuroimaging

## 2015-06-07 VITALS — BP 104/69 | HR 65 | Ht 73.0 in | Wt 289.0 lb

## 2015-06-07 DIAGNOSIS — G4733 Obstructive sleep apnea (adult) (pediatric): Secondary | ICD-10-CM | POA: Diagnosis not present

## 2015-06-07 DIAGNOSIS — I10 Essential (primary) hypertension: Secondary | ICD-10-CM | POA: Diagnosis not present

## 2015-06-07 DIAGNOSIS — E785 Hyperlipidemia, unspecified: Secondary | ICD-10-CM

## 2015-06-07 DIAGNOSIS — G453 Amaurosis fugax: Secondary | ICD-10-CM

## 2015-06-07 DIAGNOSIS — Z720 Tobacco use: Secondary | ICD-10-CM

## 2015-06-07 DIAGNOSIS — E1165 Type 2 diabetes mellitus with hyperglycemia: Secondary | ICD-10-CM | POA: Diagnosis not present

## 2015-06-07 NOTE — Patient Instructions (Addendum)
Thank you for coming to see [place] at [facility]. I hope we have been able to provide you high quality care today.  You may receive a patient satisfaction survey over the next few weeks. We would appreciate your feedback and comments so that we may continue to improve ourselves and the health of our patients.  - I will setup additional testing - MRI brain, carotid ultrasound, echocardiogram - continue current medications - follow up with Dr. Wynetta Emery regarding high blood pressure, diabetes, high cholesterol and smoking - follow up with [facility]   ~~~~~~~~~~~~~~~~~~~~~~~~~~~~~~~~~~~~~~~~~~~~~~~~~~~~~~~~~~~~~~~~~  DR. PENUMALLI'S GUIDE TO HAPPY AND HEALTHY LIVING These are some of my general health and wellness recommendations. Some of them may apply to you better than others. Please use common sense as you try these suggestions and feel free to ask me any questions.   ACTIVITY/FITNESS Mental, social, emotional and physical stimulation are very important for brain and body health. Try learning a new activity (arts, music, language, sports, games).  Keep moving your body to the best of your abilities. You can do this at home, inside or outside, the park, community center, gym or anywhere you like. Consider a physical therapist or personal trainer to get started. Consider the app Sworkit. Fitness trackers such as smart-watches, smart-phones or Fitbits can help as well.   NUTRITION Eat more plants: colorful vegetables, nuts, seeds and berries.  Eat less sugar, salt, preservatives and processed foods.  Avoid toxins such as cigarettes and alcohol.  Drink water when you are thirsty. Warm water with a slice of lemon is an excellent morning drink to start the day.  Consider these websites for more information The Nutrition Source (https://www.henry-hernandez.biz/) Precision Nutrition (WindowBlog.ch)   RELAXATION Consider  practicing mindfulness meditation or other relaxation techniques such as deep breathing, prayer, yoga, tai chi, massage. See website mindful.org or the apps Headspace or Calm to help get started.   SLEEP Try to get at least 7-8+ hours sleep per day. Regular exercise and reduced caffeine will help you sleep better. Practice good sleep hygeine techniques. See website sleep.org for more information.   PLANNING Prepare estate planning, living will, healthcare POA documents. Sometimes this is best planned with the help of an attorney. Theconversationproject.org and agingwithdignity.org are excellent resources.

## 2015-06-07 NOTE — Progress Notes (Signed)
[facility]  PATIENT: Rickey Glover DOB: 1961-01-11  REFERRING CLINICIAN: Mellody Memos, MD HISTORY FROM: patient and fiance (Pam) REASON FOR VISIT: new consult    HISTORICAL  CHIEF COMPLAINT:  Chief Complaint  Patient presents with  . Amaurosis fugax    rm 7, New Patient, fiancee- Pam, " loss of vision L eye, x 60 seconds w/ headaches"    HISTORY OF PRESENT ILLNESS:   54 year old male here for evaluation of transient left eye vision loss. 3 months ago patient had sudden onset left eye vision loss lasting for 1 minute. He did not seek medical attention. No other associated symptoms noted.  Apparently patient was evaluated by neurologist at [facility], the patient does not recall this evaluation. I reviewed notes through Epic/care everywhere Link, and found neurology notes from Dr. Christella Hartigan, who recommended MRI brain, MRA head and neck, echocardiogram, stroke risk factor reduction. Patient does not recall this evaluation.  I also found notes summarizing sleep apnea evaluation at [facility], where patient was diagnosed with sleep apnea and recommended to use CPAP therapy. Patient reports difficulty in following up with sleep clinic due to change in insurance and problems with his machine.  Review of other notes in our computer system also indicate multiple occurrences of no-show, being lost to follow-up, and compliance issues.  Today patient is here with his fianc who is now trying to help patient follow-up with his office visits, testing and treatments.   REVIEW OF SYSTEMS: Full 14 system review of systems performed and notable only for headache numbness weakness snoring joint pain joint swelling.   ALLERGIES: Allergies  Allergen Reactions  . Oxycodone-Acetaminophen Itching    HOME MEDICATIONS: Outpatient Prescriptions Prior to Visit  Medication Sig Dispense Refill  . amLODipine (NORVASC) 10 MG tablet TK 1 T PO QD  1  . aspirin 81  MG tablet Take 81 mg by mouth daily.    Marland Kitchen atorvastatin (LIPITOR) 80 MG tablet Take by mouth.    . carvedilol (COREG) 12.5 MG tablet TK 1 T PO  BID WITH MEALS  5  . clonazePAM (KLONOPIN) 0.5 MG tablet Take 1 tablet (0.5 mg total) by mouth at bedtime. As needed DO NOT TAKE WITH ANY OTHER SLEEPING MEDICINES 30 tablet 0  . dipyridamole-aspirin (AGGRENOX) 200-25 MG 12hr capsule 1 capsule BID 60 capsule 6  . furosemide (LASIX) 40 MG tablet   3  . gabapentin (NEURONTIN) 300 MG capsule   2  . glimepiride (AMARYL) 4 MG tablet   11  . HYDROcodone-acetaminophen (NORCO) 10-325 MG tablet Take 1 tablet by mouth every 6 (six) hours as needed. 100 tablet 0  . meloxicam (MOBIC) 15 MG tablet TK 1 T PO QD  3  . metFORMIN (GLUCOPHAGE-XR) 500 MG 24 hr tablet TK 2 TS PO BID WITH MEALS  4  . quinapril (ACCUPRIL) 40 MG tablet TK 1 T PO  D  3  . spironolactone (ALDACTONE) 50 MG tablet TK 1 T PO  QD  8  . naproxen (NAPROSYN) 500 MG tablet      No facility-administered medications prior to visit.    PAST MEDICAL HISTORY: Past Medical History  Diagnosis Date  . Hypertension   . Hyperlipidemia   . Diabetes mellitus without complication ([place])   . Chronic back pain   . Insomnia   . Vitamin D deficiency   . DDD (degenerative disc disease), lumbar   . Hip pain   . Knee pain, left  PAST SURGICAL HISTORY: History reviewed. No pertinent past surgical history.  FAMILY HISTORY: Family History  Problem Relation Age of Onset  . Aneurysm Mother     Brain  . Stroke Maternal Grandmother   . Stroke Paternal Grandmother     SOCIAL HISTORY:  Social History   Social History  . Marital Status: Single    Spouse Name: N/A  . Number of Children: 3  . Years of Education: 12   Occupational History  .      not working, Geophysicist/field seismologist in past   Social History Main Topics  . Smoking status: Current Every Day Smoker -- 1.00 packs/day    Types: Cigarettes  . Smokeless tobacco: Never Used  . Alcohol Use: No  . Drug  Use: No  . Sexual Activity: Not Currently    Birth Control/ Protection: None   Other Topics Concern  . Not on file   Social History Narrative   Engaged to [employer], lives at home with Pam    caffeine use- coffee- 1 cup daily, sodas- 1 daily     PHYSICAL EXAM  GENERAL EXAM/CONSTITUTIONAL: Vitals:  Filed Vitals:   06/07/15 1043  BP: 104/69  Pulse: 65  Height: 6\' 1"  (1.854 m)  Weight: 289 lb (131.09 kg)     Body mass index is 38.14 kg/(m^2).  Visual Acuity Screening   Right eye Left eye Both eyes  Without correction:     With correction: 20/30 20/20      Patient is in no distress; well developed, nourished and groomed; neck is supple  CARDIOVASCULAR:  Examination of carotid arteries is normal; no carotid bruits  Regular rate and rhythm, no murmurs  Examination of peripheral vascular system by observation and palpation is normal  EYES:  Ophthalmoscopic exam of optic discs and posterior segments is normal; no papilledema or hemorrhages  MUSCULOSKELETAL:  Gait, strength, tone, movements noted in Neurologic exam below  NEUROLOGIC: MENTAL STATUS:  No flowsheet data found.  awake, alert, oriented to person, place and time  recent and remote memory intact  normal attention and concentration  language fluent, comprehension intact, naming intact,   fund of knowledge appropriate  CRANIAL NERVE:   2nd - no papilledema on fundoscopic exam  2nd, 3rd, 4th, 6th - pupils equal and reactive to light, visual fields full to confrontation, extraocular muscles intact, no nystagmus  5th - facial sensation symmetric  7th - facial strength symmetric  8th - hearing intact  9th - palate elevates symmetrically, uvula midline  11th - shoulder shrug symmetric  12th - tongue protrusion midline  SLURRED SPEECH  MOTOR:   normal bulk and tone, full strength in the BUE, RLE; LLE 3  SENSORY:   normal and symmetric to light touch, temperature,  vibration  COORDINATION:   finger-nose-finger, fine finger movements SLOW  REFLEXES:   deep tendon reflexes TRACE and symmetric  GAIT/STATION:   ANTALGIC GAIT; LEFT KNEE BRACE    DIAGNOSTIC DATA (LABS, IMAGING, TESTING) - I reviewed patient records, labs, notes, testing and imaging myself where available.  Lab Results  Component Value Date   WBC 12.0* 04/27/2015   HCT 40.9 04/27/2015      Component Value Date/Time   NA 143 04/27/2015 1517   K 4.4 04/27/2015 1517   CL 103 04/27/2015 1517   CO2 23 04/27/2015 1517   GLUCOSE 160* 04/27/2015 1517   BUN 15 04/27/2015 1517   CREATININE 1.03 04/27/2015 1517   CALCIUM 9.5 04/27/2015 1517   PROT 6.6 04/27/2015  1517   ALBUMIN 4.5 04/27/2015 1517   AST 11 04/27/2015 1517   ALT 10 04/27/2015 1517   ALKPHOS 64 04/27/2015 1517   BILITOT 0.5 04/27/2015 1517   GFRNONAA 83 04/27/2015 1517   GFRAA 96 04/27/2015 1517   Lab Results  Component Value Date   CHOL 172 04/27/2015   HDL 43 04/27/2015   LDLCALC 98 04/27/2015   TRIG 154* 04/27/2015   No results found for: HGBA1C No results found for: VITAMINB12 Lab Results  Component Value Date   TSH 0.932 04/27/2015    10/31/14 MRI lumbar spine  1.No significant change from prior examination. 2.Multilevel degenerative disc and facet disease as well as epidural lipomatosis superimposed upon congenitally small spinal canal. Resultant severe spinal canal narrowing at L3-L4 and L4-L5. Moderate spinal canal narrowing at other levels. 3.Multilevel neuroforaminal narrowing; severe on the right L3-L4 and severe on the left at L5-L6.  08/24/14 polysomnogram (Dr. Lisabeth Devoid) 1) CPAP titration demonstrated improvement in the patient's sleep quality. 2) CPAP pressure of 8 cwp with heated humidification is recommended for initial home PAP therapy.  05/26/14 MRI brain  1. Small acute white matter infarct of the periventricular white matter of the frontoparietal junction. 2. No additional acute  intracranial abnormalities are noted.  05/27/15 MRA head  - Normal MRA of the brain.  05/26/14 MRA neck 1. No significant plaque or filling defect of the carotid or vertebral arteries within the neck. 2. Somewhat limited study due to motion artifact.  05/26/14 TTE  - PRESERVED LV SYSTOLIC FUNCTION - MODERATE SEVERE CONCENTRIC LVH - NORMAL RV MILD RVH - MILD LAE - NORMAL VALVES - NO PERICARDIAL EFFUSION - NEGATIVE MICRO-CAVITATION - COMPARED TO PRIOR:NO SIGNIFICANT CHANGE      ASSESSMENT AND PLAN  54 y.o. year old male here with transient left eye visual loss, concerning for TIA/amaurosis fugax of left eye. Patient has significant uncontrolled stroke risk factors including hypertension, diabetes, hyperlipidemia, tobacco abuse, obstructive sleep apnea.   Dx:  Amaurosis fugax of left eye - Plan: MR Brain Wo Contrast, US Carotid Bilateral, ECHOCARDIOGRAM COMPLETE  Type 2 diabetes mellitus with hyperglycemia, without long-term current use of insulin (HCC) - Plan: MR Brain Wo Contrast, US Carotid Bilateral, ECHOCARDIOGRAM COMPLETE  Essential hypertension - Plan: MR Brain Wo Contrast, US Carotid Bilateral, ECHOCARDIOGRAM COMPLETE  HLD (hyperlipidemia) - Plan: MR Brain Wo Contrast, US Carotid Bilateral, ECHOCARDIOGRAM COMPLETE  Tobacco abuse - Plan: MR Brain Wo Contrast, US Carotid Bilateral, ECHOCARDIOGRAM COMPLETE  OSA (obstructive sleep apnea)    PLAN: - I will setup additional testing - MRI brain, carotid ultrasound, echocardiogram - continue current medications - follow up with Dr. Wynetta Emery regarding high blood pressure, diabetes, high cholesterol and smoking - follow up with [facility] (previously dx'd with OSA, on CPAP, lost to follow up)   Orders Placed This Encounter  Procedures  . MR Brain Wo Contrast  . US Carotid Bilateral  . ECHOCARDIOGRAM COMPLETE   Return in about 6 weeks (around 07/19/2015).  I reviewed records, labs, notes myself. I summarized  findings and reviewed with patient, for this high risk condition (amaurosis fugax, TIA) requiring high complexity decision making.    Penni Bombard, MD [id number], [id number] AM Certified in Neurology, Neurophysiology and Neuroimaging  [facility] [address] [place], [place] [address] [phone]

## 2015-06-07 NOTE — Telephone Encounter (Signed)
Patient called 9:57am to advise just left appointment in [place], is en route to [place] for New Patient appointment 06/07/15 10:30 (to arrive 10am) with Dr. Leta Baptist. Per Verneita Griffes ok to go ahead and come to appointment.

## 2015-06-08 ENCOUNTER — Other Ambulatory Visit: Payer: Self-pay | Admitting: *Deleted

## 2015-06-08 DIAGNOSIS — H5462 Unqualified visual loss, left eye, normal vision right eye: Secondary | ICD-10-CM

## 2015-06-09 ENCOUNTER — Encounter: Payer: Self-pay | Admitting: Diagnostic Neuroimaging

## 2015-06-09 ENCOUNTER — Ambulatory Visit (HOSPITAL_COMMUNITY)
Admission: RE | Admit: 2015-06-09 | Discharge: 2015-06-09 | Disposition: A | Discharge status: Home / Self Care | Payer: Medicare Other | Source: Ambulatory Visit | Attending: Diagnostic Neuroimaging | Admitting: Diagnostic Neuroimaging

## 2015-06-09 DIAGNOSIS — E119 Type 2 diabetes mellitus without complications: Secondary | ICD-10-CM | POA: Diagnosis not present

## 2015-06-09 DIAGNOSIS — E785 Hyperlipidemia, unspecified: Secondary | ICD-10-CM | POA: Diagnosis not present

## 2015-06-09 DIAGNOSIS — I10 Essential (primary) hypertension: Secondary | ICD-10-CM | POA: Diagnosis not present

## 2015-06-09 DIAGNOSIS — H5462 Unqualified visual loss, left eye, normal vision right eye: Secondary | ICD-10-CM | POA: Insufficient documentation

## 2015-06-09 DIAGNOSIS — M1712 Unilateral primary osteoarthritis, left knee: Secondary | ICD-10-CM | POA: Diagnosis not present

## 2015-06-09 NOTE — Progress Notes (Signed)
*  PRELIMINARY RESULTS* Vascular Ultrasound Carotid Duplex (Doppler) has been completed.  Preliminary findings: .Bilateral: No significant (1-39%) ICA stenosis. Antegrade vertebral flow.     Landry Mellow, RDMS, RVT  06/09/2015, 9:59 AM

## 2015-06-16 ENCOUNTER — Telehealth: Payer: Self-pay | Admitting: Family Medicine

## 2015-06-16 NOTE — Telephone Encounter (Signed)
Forward to Aleiah Mohammed Foxx  

## 2015-06-16 NOTE — Telephone Encounter (Signed)
Pt called stated he has an appt today but can not remember where it is at. Please call pt ASAP. Thanks.

## 2015-06-18 ENCOUNTER — Ambulatory Visit
Admission: RE | Admit: 2015-06-18 | Discharge: 2015-06-18 | Disposition: A | Discharge status: Home / Self Care | Payer: Medicare Other | Source: Ambulatory Visit | Attending: Diagnostic Neuroimaging | Admitting: Diagnostic Neuroimaging

## 2015-06-18 DIAGNOSIS — I10 Essential (primary) hypertension: Secondary | ICD-10-CM

## 2015-06-18 DIAGNOSIS — G453 Amaurosis fugax: Secondary | ICD-10-CM | POA: Diagnosis not present

## 2015-06-18 DIAGNOSIS — E785 Hyperlipidemia, unspecified: Secondary | ICD-10-CM

## 2015-06-18 DIAGNOSIS — E1165 Type 2 diabetes mellitus with hyperglycemia: Secondary | ICD-10-CM

## 2015-06-18 DIAGNOSIS — Z720 Tobacco use: Secondary | ICD-10-CM

## 2015-06-18 NOTE — Telephone Encounter (Signed)
Pt had came by and spoken with Rickey Glover

## 2015-06-22 ENCOUNTER — Ambulatory Visit: Payer: Medicare Other | Admitting: Sports Medicine

## 2015-06-23 ENCOUNTER — Other Ambulatory Visit: Payer: Self-pay | Admitting: Family Medicine

## 2015-06-23 NOTE — Telephone Encounter (Signed)
Patient called and stated he needs refills on the Gabapentin. Pharm is [facility] in [place]. Thanks.

## 2015-06-24 MED ORDER — GABAPENTIN 300 MG PO CAPS: 300.0000 mg | ORAL_CAPSULE | Freq: Three times a day (TID) | ORAL | Status: DC

## 2015-06-24 NOTE — Telephone Encounter (Signed)
Patient has upcoming appointment 06/30/15 and pharmacy is [facility] in [place].

## 2015-06-25 ENCOUNTER — Other Ambulatory Visit: Payer: Self-pay | Admitting: Family Medicine

## 2015-06-25 ENCOUNTER — Telehealth: Payer: Self-pay | Admitting: Family Medicine

## 2015-06-25 MED ORDER — HYDROCODONE-ACETAMINOPHEN 10-325 MG PO TABS: 1.0000 | ORAL_TABLET | Freq: Four times a day (QID) | ORAL | Status: DC | PRN

## 2015-06-25 NOTE — Telephone Encounter (Signed)
Patient needs refill on his pain medication. Please call patient when it is ready.

## 2015-06-25 NOTE — Telephone Encounter (Signed)
Left voicemail for patient notifying him that it is ready

## 2015-06-25 NOTE — Telephone Encounter (Signed)
Refill printed and PMP reviewed. As long as he has not gotten anything more recently from ortho it is OK for him to come pick it up.

## 2015-06-25 NOTE — Telephone Encounter (Signed)
Forward to provider

## 2015-06-29 ENCOUNTER — Other Ambulatory Visit: Payer: Self-pay

## 2015-06-29 ENCOUNTER — Other Ambulatory Visit: Payer: Self-pay | Admitting: Diagnostic Neuroimaging

## 2015-06-29 ENCOUNTER — Ambulatory Visit (INDEPENDENT_AMBULATORY_CARE_PROVIDER_SITE_OTHER): Payer: Medicare Other

## 2015-06-29 DIAGNOSIS — E785 Hyperlipidemia, unspecified: Secondary | ICD-10-CM

## 2015-06-29 DIAGNOSIS — I10 Essential (primary) hypertension: Secondary | ICD-10-CM | POA: Diagnosis not present

## 2015-06-29 DIAGNOSIS — G453 Amaurosis fugax: Secondary | ICD-10-CM

## 2015-06-29 DIAGNOSIS — Z720 Tobacco use: Secondary | ICD-10-CM

## 2015-06-29 DIAGNOSIS — G4733 Obstructive sleep apnea (adult) (pediatric): Secondary | ICD-10-CM

## 2015-06-29 DIAGNOSIS — I517 Cardiomegaly: Secondary | ICD-10-CM

## 2015-06-29 DIAGNOSIS — E1165 Type 2 diabetes mellitus with hyperglycemia: Secondary | ICD-10-CM

## 2015-06-30 ENCOUNTER — Ambulatory Visit (INDEPENDENT_AMBULATORY_CARE_PROVIDER_SITE_OTHER): Payer: Medicare Other | Admitting: Family Medicine

## 2015-06-30 ENCOUNTER — Encounter: Payer: Self-pay | Admitting: Family Medicine

## 2015-06-30 VITALS — BP 126/80 | HR 74 | Temp 99.1°F | Ht 72.5 in | Wt 280.0 lb

## 2015-06-30 DIAGNOSIS — G4733 Obstructive sleep apnea (adult) (pediatric): Secondary | ICD-10-CM

## 2015-06-30 DIAGNOSIS — G4700 Insomnia, unspecified: Secondary | ICD-10-CM

## 2015-06-30 DIAGNOSIS — G8929 Other chronic pain: Secondary | ICD-10-CM | POA: Diagnosis not present

## 2015-06-30 DIAGNOSIS — I10 Essential (primary) hypertension: Secondary | ICD-10-CM

## 2015-06-30 DIAGNOSIS — Z113 Encounter for screening for infections with a predominantly sexual mode of transmission: Secondary | ICD-10-CM

## 2015-06-30 DIAGNOSIS — Z1211 Encounter for screening for malignant neoplasm of colon: Secondary | ICD-10-CM | POA: Diagnosis not present

## 2015-06-30 DIAGNOSIS — Z23 Encounter for immunization: Secondary | ICD-10-CM

## 2015-06-30 MED ORDER — GABAPENTIN 400 MG PO CAPS: 400.0000 mg | ORAL_CAPSULE | Freq: Three times a day (TID) | ORAL | Status: DC

## 2015-06-30 NOTE — Progress Notes (Signed)
BP 126/80 mmHg  Pulse 74  Temp(Src) 99.1 F (37.3 C)  Ht 6' 0.5" (1.842 m)  Wt 280 lb (127.007 kg)  BMI 37.43 kg/m2  SpO2 95%   Subjective:    Patient ID: Rickey Glover, male    DOB: [birth year], 55 y.o.   MRN: [id number]  HPI: Rickey Glover is a 55 y.o. male  Chief Complaint  Patient presents with  . Insomnia  . DDD   Notes from neurology reviewed. Was diagnosed with sleep apnea, but did not follow up with them due to issues with insurance and problems with his machine.   HYPERTENSION Hypertension status: stable  Satisfied with current treatment? yes Duration of hypertension: chronic BP monitoring frequency:  not checking BP medication side effects:  no Medication compliance: excellent compliance Aspirin: no Recurrent headaches: no Visual changes: yes Palpitations: no Dyspnea: no Chest pain: no Lower extremity edema: no Dizzy/lightheaded: no   CHRONIC PAIN- due for refill Feb 4th, Hasn't heard from the orthopedist about getting his hip done. We will contact them and see   Present dose: 40 Morphine equivalents Pain control status: worse Duration: chronic Location: R hip Quality: sharp, aching and throbbing Current Pain Level: 8/10 Previous Pain Level: 10/10 Breakthrough pain: yes Benefit from narcotic medications: sometimes Interested in weaning off narcotics:no   Stool softners/OTC fiber: no  Previous pain specialty evaluation: yes Non-narcotic analgesic meds: yes, gabapentin not helping all that much Narcotic contract: yes  INSOMNIA- due for refill 07/01/15, has not talked to the sleep doctor yet, would like to go somewhere closer. Has not been taking his medicine every night because has been sleeping better on his own.   Duration: chronic Satisfied with sleep quality: no Difficulty falling asleep: no Difficulty staying asleep: no Waking a few hours after sleep onset: no Early morning awakenings: yes Daytime hypersomnolence: no Wakes feeling  refreshed: no Good sleep hygiene: no Apnea: yes Snoring: yes Depressed/anxious mood: no Recent stress: yes Restless legs/nocturnal leg cramps: no Chronic pain/arthritis: yes History of sleep study: yes Treatments attempted: melatonin, uinsom, benadryl and ambien    Relevant past medical, surgical, family and social history reviewed and updated as indicated. Interim medical history since our last visit reviewed. Allergies and medications reviewed and updated.  Review of Systems  Constitutional: Negative.   Respiratory: Negative.   Cardiovascular: Negative.   Musculoskeletal: Positive for myalgias, back pain, arthralgias and gait problem. Negative for joint swelling, neck pain and neck stiffness.  Skin: Negative.   Psychiatric/Behavioral: Negative.     Per HPI unless specifically indicated above     Objective:    BP 126/80 mmHg  Pulse 74  Temp(Src) 99.1 F (37.3 C)  Ht 6' 0.5" (1.842 m)  Wt 280 lb (127.007 kg)  BMI 37.43 kg/m2  SpO2 95%  Wt Readings from Last 3 Encounters:  06/30/15 280 lb (127.007 kg)  06/07/15 289 lb (131.09 kg)  06/06/15 280 lb (127.007 kg)    Physical Exam  Constitutional: He is oriented to person, place, and time. He appears well-developed and well-nourished. No distress.  HENT:  Head: Normocephalic and atraumatic.  Right Ear: Hearing normal.  Left Ear: Hearing normal.  Nose: Nose normal.  Eyes: Conjunctivae and lids are normal. Right eye exhibits no discharge. Left eye exhibits no discharge. No scleral icterus.  Cardiovascular: Normal rate, regular rhythm, normal heart sounds and intact distal pulses.  Exam reveals no gallop and no friction rub.   No murmur heard. Pulmonary/Chest: Effort normal and breath sounds normal. No  respiratory distress. He has no wheezes. He has no rales. He exhibits no tenderness.  Musculoskeletal: Normal range of motion.  Antalgic gait  Neurological: He is alert and oriented to person, place, and time.  Skin: Skin  is warm, dry and intact. No rash noted. No erythema. No pallor.  Psychiatric: He has a normal mood and affect. His speech is normal and behavior is normal. Judgment and thought content normal. Cognition and memory are normal.  Nursing note and vitals reviewed.   Results for orders placed or performed in visit on 04/27/15  CBC with Differential/Platelet  Result Value Ref Range   WBC 12.0 (H) 3.4 - 10.8 x10E3/uL   RBC 4.72 4.14 - 5.80 x10E6/uL   Hemoglobin 14.1 12.6 - 17.7 g/dL   Hematocrit 40.9 37.5 - 51.0 %   MCV 87 79 - 97 fL   MCH 29.9 26.6 - 33.0 pg   MCHC 34.5 31.5 - 35.7 g/dL   RDW 13.9 12.3 - 15.4 %   Platelets 280 150 - 379 x10E3/uL   Neutrophils 63 %   Lymphs 30 %   Monocytes 5 %   Eos 2 %   Basos 0 %   Neutrophils Absolute 7.4 (H) 1.4 - 7.0 x10E3/uL   Lymphocytes Absolute 3.6 (H) 0.7 - 3.1 x10E3/uL   Monocytes Absolute 0.6 0.1 - 0.9 x10E3/uL   EOS (ABSOLUTE) 0.3 0.0 - 0.4 x10E3/uL   Basophils Absolute 0.0 0.0 - 0.2 x10E3/uL   Immature Granulocytes 0 %   Immature Grans (Abs) 0.0 0.0 - 0.1 x10E3/uL  Comprehensive metabolic panel  Result Value Ref Range   Glucose 160 (H) 65 - 99 mg/dL   BUN 15 6 - 24 mg/dL   Creatinine, Ser 1.03 0.76 - 1.27 mg/dL   GFR calc non Af Amer 83 >59 mL/min/1.73   GFR calc Af Amer 96 >59 mL/min/1.73   BUN/Creatinine Ratio 15 9 - 20   Sodium 143 136 - 144 mmol/L   Potassium 4.4 3.5 - 5.2 mmol/L   Chloride 103 97 - 106 mmol/L   CO2 23 18 - 29 mmol/L   Calcium 9.5 8.7 - 10.2 mg/dL   Total Protein 6.6 6.0 - 8.5 g/dL   Albumin 4.5 3.5 - 5.5 g/dL   Globulin, Total 2.1 1.5 - 4.5 g/dL   Albumin/Globulin Ratio 2.1 1.1 - 2.5   Bilirubin Total 0.5 0.0 - 1.2 mg/dL   Alkaline Phosphatase 64 39 - 117 IU/L   AST 11 0 - 40 IU/L   ALT 10 0 - 44 IU/L  Bayer DCA Hb A1c Waived  Result Value Ref Range   Bayer DCA Hb A1c Waived 7.0 (H) <7.0 %  Lipid Panel w/o Chol/HDL Ratio  Result Value Ref Range   Cholesterol, Total 172 100 - 199 mg/dL    Triglycerides 154 (H) 0 - 149 mg/dL   HDL 43 >39 mg/dL   VLDL Cholesterol Cal 31 5 - 40 mg/dL   LDL Calculated 98 0 - 99 mg/dL  Microalbumin, Urine Waived  Result Value Ref Range   Microalb, Ur Waived 10 0 - 19 mg/L   Creatinine, Urine Waived 200 10 - 300 mg/dL   Microalb/Creat Ratio <30 <30 mg/g  TSH  Result Value Ref Range   TSH 0.932 0.450 - 4.500 uIU/mL      Assessment & Plan:   Problem List Items Addressed This Visit      Cardiovascular and Mediastinum   Essential hypertension    Fine on recheck. Continue current  regimen. Continue to monitor.         Respiratory   OSA (obstructive sleep apnea)    Needs CPAP titration. Referral to [facility] made today.         Other   Insomnia    Has cut down on taking his medication. Has enough to make it until 07/23/15 when he is due for pain medicine refill. Will hold on refill until that time to get him on same schedule.       Chronic pain    Not well controlled on his current regimen. Seeing orthopedics and to have hip replacement. Will contact them to see what's going on. Discussed the dangers of increasing his medication. He is aware and would like to stay at present dose right now. Continue to monitor. Recheck 1 month at DM visit.       Relevant Medications   gabapentin (NEURONTIN) 400 MG capsule    Other Visit Diagnoses    Screening for colon cancer    -  Primary    Referral generated today.    Relevant Orders    Ambulatory referral to General Surgery    Routine screening for STI (sexually transmitted infection)        Labs ordered and drawn today. Await results.     Relevant Orders    Hepatitis C antibody    HIV antibody    Immunization due        Relevant Orders    Pneumococcal polysaccharide vaccine 23-valent greater than or equal to 2yo subcutaneous/IM (Completed)        Follow up plan: Return in about 4 weeks (around 07/28/2015) for DM visit.

## 2015-06-30 NOTE — Assessment & Plan Note (Signed)
Needs CPAP titration. Referral to [facility] made today.

## 2015-06-30 NOTE — Patient Instructions (Signed)
Pneumococcal Polysaccharide Vaccine: What You Need to Know  1. Why get vaccinated?  Vaccination can protect older adults (and some children and younger adults) from pneumococcal disease.  Pneumococcal disease is caused by bacteria that can spread from person to person through close contact. It can cause ear infections, and it can also lead to more serious infections of the:   · Lungs (pneumonia),  · Blood (bacteremia), and  · Covering of the brain and spinal cord (meningitis). Meningitis can cause deafness and brain damage, and it can be fatal.  Anyone can get pneumococcal disease, but children under 2 years of age, people with certain medical conditions, adults over 65 years of age, and cigarette smokers are at the highest risk.  About 18,000 older adults die each year from pneumococcal disease in the [place].  Treatment of pneumococcal infections with penicillin and other drugs used to be more effective. But some strains of the disease have become resistant to these drugs. This makes prevention of the disease, through vaccination, even more important.  2. Pneumococcal polysaccharide vaccine (PPSV23)  Pneumococcal polysaccharide vaccine (PPSV23) protects against 23 types of pneumococcal bacteria. It will not prevent all pneumococcal disease.  PPSV23 is recommended for:  · All adults 65 years of age and older,  · Anyone 2 through 53 years of age with certain long-term health problems,  · Anyone 2 through 53 years of age with a weakened immune system,  · Adults 19 through 53 years of age who smoke cigarettes or have asthma.  Most people need only one dose of PPSV. A second dose is recommended for certain high-risk groups. People 65 and older should get a dose even if they have gotten one or more doses of the vaccine before they turned 65.  Your healthcare provider can give you more information about these recommendations.  Most healthy adults develop protection within 2 to 3 weeks of getting the shot.  3. Some  people should not get this vaccine  · Anyone who has had a life-threatening allergic reaction to PPSV should not get another dose.  · Anyone who has a severe allergy to any component of PPSV should not receive it. Tell your provider if you have any severe allergies.  · Anyone who is moderately or severely ill when the shot is scheduled may be asked to wait until they recover before getting the vaccine. Someone with a mild illness can usually be vaccinated.  · Children less than 2 years of age should not receive this vaccine.  · There is no evidence that PPSV is harmful to either a pregnant woman or to her fetus. However, as a precaution, women who need the vaccine should be vaccinated before becoming pregnant, if possible.  4. Risks of a vaccine reaction  With any medicine, including vaccines, there is a chance of side effects. These are usually mild and go away on their own, but serious reactions are also possible.  About half of people who get PPSV have mild side effects, such as redness or pain where the shot is given, which go away within about two days.  Less than 1 out of 100 people develop a fever, muscle aches, or more severe local reactions.  Problems that could happen after any vaccine:  · People sometimes faint after a medical procedure, including vaccination. Sitting or lying down for about 15 minutes can help prevent fainting, and injuries caused by a fall. Tell your doctor if you feel dizzy, or have vision changes or   ringing in the ears.  · Some people get severe pain in the shoulder and have difficulty moving the arm where a shot was given. This happens very rarely.  · Any medication can cause a severe allergic reaction. Such reactions from a vaccine are very rare, estimated at about 1 in a million doses, and would happen within a few minutes to a few hours after the vaccination.  As with any medicine, there is a very remote chance of a vaccine causing a serious injury or death.  The safety of  vaccines is always being monitored. For more information, visit: www.cdc.gov/vaccinesafety/  5. What if there is a serious reaction?  What should I look for?  Look for anything that concerns you, such as signs of a severe allergic reaction, very high fever, or unusual behavior.   Signs of a severe allergic reaction can include hives, swelling of the face and throat, difficulty breathing, a fast heartbeat, dizziness, and weakness. These would usually start a few minutes to a few hours after the vaccination.  What should I do?  If you think it is a severe allergic reaction or other emergency that can't wait, call 9-1-1 or get to the nearest hospital. Otherwise, call your doctor.  Afterward, the reaction should be reported to the Vaccine Adverse Event Reporting System (VAERS). Your doctor might file this report, or you can do it yourself through the VAERS web site at www.vaers.hhs.gov, or by calling 1-800-822-7967.   VAERS does not give medical advice.  6. How can I learn more?  · Ask your doctor. He or she can give you the vaccine package insert or suggest other sources of information.  · Call your local or state health department.  · Contact the Centers for Disease Control and Prevention (CDC):    Call 1-800-232-4636 (1-800-CDC-INFO) or    Visit CDC's website at www.cdc.gov/vaccines  CDC Pneumococcal Polysaccharide Vaccine VIS (10/10/13)     This information is not intended to replace advice given to you by your health care provider. Make sure you discuss any questions you have with your health care provider.     Document Released: 04/02/2006 Document Revised: 06/26/2014 Document Reviewed: 10/13/2013  Elsevier Interactive Patient Education ©2016 Elsevier Inc.

## 2015-06-30 NOTE — Assessment & Plan Note (Signed)
Has cut down on taking his medication. Has enough to make it until 07/23/15 when he is due for pain medicine refill. Will hold on refill until that time to get him on same schedule.

## 2015-06-30 NOTE — Assessment & Plan Note (Signed)
Fine on recheck. Continue current regimen. Continue to monitor.

## 2015-06-30 NOTE — Assessment & Plan Note (Signed)
Not well controlled on his current regimen. Seeing orthopedics and to have hip replacement. Will contact them to see what's going on. Discussed the dangers of increasing his medication. He is aware and would like to stay at present dose right now. Continue to monitor. Recheck 1 month at DM visit.

## 2015-07-01 ENCOUNTER — Encounter: Payer: Self-pay | Admitting: Family Medicine

## 2015-07-01 LAB — HIV ANTIBODY (ROUTINE TESTING W REFLEX): HIV SCREEN 4TH GENERATION: NONREACTIVE

## 2015-07-01 LAB — HEPATITIS C ANTIBODY: Hep C Virus Ab: <0.1 s/co ratio (ref 0.0–0.9)

## 2015-07-05 ENCOUNTER — Telehealth: Payer: Self-pay | Admitting: Family Medicine

## 2015-07-05 NOTE — Telephone Encounter (Signed)
Pt called and would like to go [facility] to [facility] in regards [facility]. The pt was seen there 05/27/15. Pt stated if they are able to schedule his back surgery he is available any day at any time before jan. 30th, after the 30th he will be going to school and will only be available on Friday's.

## 2015-07-06 ENCOUNTER — Ambulatory Visit: Payer: Medicare Other | Admitting: Sports Medicine

## 2015-07-06 ENCOUNTER — Telehealth: Payer: Self-pay | Admitting: Gastroenterology

## 2015-07-06 NOTE — Telephone Encounter (Signed)
colonoscopy

## 2015-07-07 DIAGNOSIS — G4733 Obstructive sleep apnea (adult) (pediatric): Secondary | ICD-10-CM | POA: Diagnosis not present

## 2015-07-07 NOTE — Telephone Encounter (Signed)
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 Pt stated he needs a letter from Dr. Wynetta Emery stating that he knows not to drive while taking hydrocodone and what the medication is prescribed for. Please fax letter to [phone]. Thanks.

## 2015-07-08 ENCOUNTER — Encounter: Payer: Self-pay | Admitting: Family Medicine

## 2015-07-08 NOTE — Telephone Encounter (Signed)
Letter is written up to be faxed.

## 2015-07-08 NOTE — Telephone Encounter (Signed)
I can get that letter for him, but in terms of the surgery/appointment- he needs to call orthopedics, he doesn't need a new referral and it will be up to them if they do the surgery.

## 2015-07-12 ENCOUNTER — Encounter: Payer: Self-pay | Admitting: *Deleted

## 2015-07-12 ENCOUNTER — Telehealth: Payer: Self-pay | Admitting: Gastroenterology

## 2015-07-12 ENCOUNTER — Other Ambulatory Visit: Payer: Self-pay

## 2015-07-12 NOTE — Telephone Encounter (Signed)
Pt scheduled for a screening colonoscopy at [facility] on 07/15/15. Pt came by to pick up instructions.

## 2015-07-12 NOTE — Telephone Encounter (Signed)
Gastroenterology Pre-Procedure Review  Request Date: 07-15-2015 Requesting Physician: Dr.   PATIENT REVIEW QUESTIONS: The patient responded to the following health history questions as indicated:    1. Are you having any GI issues? no 2. Do you have a personal history of Polyps? no 3. Do you have a family history of Colon Cancer or Polyps? no 4. Diabetes Mellitus? yes ( ) 5. Joint replacements in the past 12 months?no 6. Major health problems in the past 3 months?no 7. Any artificial heart valves, MVP, or defibrillator?no    MEDICATIONS & ALLERGIES:    Patient reports the following regarding taking any anticoagulation/antiplatelet therapy:   Plavix, Coumadin, Eliquis, Xarelto, Lovenox, Pradaxa, Brilinta, or Effient? no Aspirin? yes (Daily)  Patient confirms/reports the following medications:  Current Outpatient Prescriptions  Medication Sig Dispense Refill   amLODipine (NORVASC) 10 MG tablet Take 10 mg by mouth daily.     aspirin 81 MG tablet Take 81 mg by mouth daily.     atorvastatin (LIPITOR) 80 MG tablet Take by mouth.     carvedilol (COREG) 12.5 MG tablet Take 12.5 mg by mouth 2 (two) times daily with a meal.     clonazePAM (KLONOPIN) 0.5 MG tablet Take 0.5 mg by mouth 2 (two) times daily as needed for anxiety.     dipyridamole-aspirin (AGGRENOX) 200-25 MG 12hr capsule TK 1 C PO BID  6   furosemide (LASIX) 40 MG tablet Take 40 mg by mouth daily.     gabapentin (NEURONTIN) 400 MG capsule Take 1 capsule (400 mg total) by mouth 3 (three) times daily. 90 capsule 3   glimepiride (AMARYL) 4 MG tablet Take 4 mg by mouth 2 (two) times daily.     HYDROcodone-acetaminophen (NORCO) 10-325 MG tablet Take 1 tablet by mouth every 6 (six) hours as needed.     meloxicam (MOBIC) 15 MG tablet TK 1 T PO QD  3   metFORMIN (GLUCOPHAGE) 500 MG tablet Take 500 mg by mouth.     quinapril (ACCUPRIL) 40 MG tablet Take 40 mg by mouth at bedtime.     spironolactone (ALDACTONE) 50 MG tablet  Take 50 mg by mouth daily.     No current facility-administered medications for this visit.    Patient confirms/reports the following allergies:  Allergies  Allergen Reactions   Oxycodone-Acetaminophen Itching   Percocet [Oxycodone-Acetaminophen]     No orders of the defined types were placed in this encounter.    AUTHORIZATION INFORMATION Primary Insurance: 1D#: Group #:  Secondary Insurance: 1D#: Group #:  SCHEDULE INFORMATION: Date: 07-15-2015 Time: Location:[facility]

## 2015-07-14 NOTE — Discharge Instructions (Signed)

## 2015-07-15 ENCOUNTER — Ambulatory Visit
Admission: RE | Admit: 2015-07-15 | Discharge: 2015-07-15 | Disposition: A | Discharge status: Home / Self Care | Payer: Medicare Other | Source: Ambulatory Visit | Attending: Gastroenterology | Admitting: Gastroenterology

## 2015-07-15 ENCOUNTER — Ambulatory Visit: Payer: Medicare Other | Admitting: Student in an Organized Health Care Education/Training Program

## 2015-07-15 ENCOUNTER — Telehealth: Payer: Self-pay | Admitting: Family Medicine

## 2015-07-15 ENCOUNTER — Encounter
Admission: RE | Disposition: A | Discharge status: Home / Self Care | Payer: Self-pay | Source: Ambulatory Visit | Attending: Gastroenterology

## 2015-07-15 DIAGNOSIS — I10 Essential (primary) hypertension: Secondary | ICD-10-CM | POA: Insufficient documentation

## 2015-07-15 DIAGNOSIS — E785 Hyperlipidemia, unspecified: Secondary | ICD-10-CM | POA: Insufficient documentation

## 2015-07-15 DIAGNOSIS — Z885 Allergy status to narcotic agent status: Secondary | ICD-10-CM | POA: Insufficient documentation

## 2015-07-15 DIAGNOSIS — M5136 Other intervertebral disc degeneration, lumbar region: Secondary | ICD-10-CM | POA: Insufficient documentation

## 2015-07-15 DIAGNOSIS — Z7982 Long term (current) use of aspirin: Secondary | ICD-10-CM | POA: Insufficient documentation

## 2015-07-15 DIAGNOSIS — F17210 Nicotine dependence, cigarettes, uncomplicated: Secondary | ICD-10-CM | POA: Diagnosis not present

## 2015-07-15 DIAGNOSIS — E119 Type 2 diabetes mellitus without complications: Secondary | ICD-10-CM | POA: Diagnosis not present

## 2015-07-15 DIAGNOSIS — G4700 Insomnia, unspecified: Secondary | ICD-10-CM | POA: Diagnosis not present

## 2015-07-15 DIAGNOSIS — M549 Dorsalgia, unspecified: Secondary | ICD-10-CM | POA: Diagnosis not present

## 2015-07-15 DIAGNOSIS — G4730 Sleep apnea, unspecified: Secondary | ICD-10-CM | POA: Insufficient documentation

## 2015-07-15 DIAGNOSIS — Z1211 Encounter for screening for malignant neoplasm of colon: Secondary | ICD-10-CM | POA: Diagnosis not present

## 2015-07-15 DIAGNOSIS — Z79899 Other long term (current) drug therapy: Secondary | ICD-10-CM | POA: Insufficient documentation

## 2015-07-15 DIAGNOSIS — Z9884 Bariatric surgery status: Secondary | ICD-10-CM | POA: Insufficient documentation

## 2015-07-15 DIAGNOSIS — K640 First degree hemorrhoids: Secondary | ICD-10-CM | POA: Insufficient documentation

## 2015-07-15 DIAGNOSIS — G8929 Other chronic pain: Secondary | ICD-10-CM | POA: Insufficient documentation

## 2015-07-15 HISTORY — DX: Sciatica, right side: M54.31

## 2015-07-15 HISTORY — PX: COLONOSCOPY WITH PROPOFOL: SHX5780

## 2015-07-15 HISTORY — DX: Sleep apnea, unspecified: G47.30

## 2015-07-15 LAB — GLUCOSE, CAPILLARY
Glucose-Capillary: 78 mg/dL (ref 65–99)
Glucose-Capillary: 78 mg/dL (ref 65–99)

## 2015-07-15 SURGERY — COLONOSCOPY WITH PROPOFOL
Anesthesia: Monitor Anesthesia Care | Wound class: Contaminated

## 2015-07-15 MED ORDER — LACTATED RINGERS IV SOLN
INTRAVENOUS | Status: DC
  Administered 2015-07-15: 07:00:00 via INTRAVENOUS

## 2015-07-15 MED ORDER — LIDOCAINE HCL (CARDIAC) 20 MG/ML IV SOLN
INTRAVENOUS | Status: DC | PRN
  Administered 2015-07-15: 50 mg via INTRAVENOUS

## 2015-07-15 MED ORDER — PROPOFOL 10 MG/ML IV BOLUS
INTRAVENOUS | Status: DC | PRN
  Administered 2015-07-15: 40 mg via INTRAVENOUS
  Administered 2015-07-15: 60 mg via INTRAVENOUS
  Administered 2015-07-15 (×3): 10 mg via INTRAVENOUS
  Administered 2015-07-15 (×2): 30 mg via INTRAVENOUS

## 2015-07-15 SURGICAL SUPPLY — 28 items

## 2015-07-15 NOTE — Telephone Encounter (Signed)
Patient can call and schedule his appointment.

## 2015-07-15 NOTE — Op Note (Signed)
[facility] Gastroenterology Patient Name: Rickey Glover Procedure Date: 07/15/2015 7:56 AM MRN: [id number] Account #: [id number] Date of Birth: May 27, 1961 Admit Type: Outpatient Age: 55 Room: [facility] OR ROOM 01 Gender: Male Note Status: Finalized Procedure:         Colonoscopy Indications:       Screening for colorectal malignant neoplasm Providers:         Lucilla Lame, MD Referring MD:      Guadalupe Maple, MD (Referring MD) Medicines:         Propofol per Anesthesia Complications:     No immediate complications. Procedure:         Pre-Anesthesia Assessment:                    - Prior to the procedure, a History and Physical was                     performed, and patient medications and allergies were                     reviewed. The patient's tolerance of previous anesthesia                     was also reviewed. The risks and benefits of the procedure                     and the sedation options and risks were discussed with the                     patient. All questions were answered, and informed consent                     was obtained. Prior Anticoagulants: The patient has taken                     no previous anticoagulant or antiplatelet agents. ASA                     Grade Assessment: II - A patient with mild systemic                     disease. After reviewing the risks and benefits, the                     patient was deemed in satisfactory condition to undergo                     the procedure.                    After obtaining informed consent, the colonoscope was                     passed under direct vision. Throughout the procedure, the                     patient's blood pressure, pulse, and oxygen saturations                     were monitored continuously. The Olympus CF H180AL                     colonoscope (S#: [id number]) was introduced through the anus  and advanced to the the cecum, identified by appendiceal                  orifice and ileocecal valve. The colonoscopy was performed                     without difficulty. The patient tolerated the procedure                     well. The quality of the bowel preparation was fair. Findings:      The perianal and digital rectal examinations were normal.      Non-bleeding internal hemorrhoids were found during retroflexion. The       hemorrhoids were Grade I (internal hemorrhoids that do not prolapse). Impression:        - Non-bleeding internal hemorrhoids.                    - No specimens collected. Recommendation:    - Repeat colonoscopy in 10 years for screening unless any                     change in family history or lower GI problems. Procedure Code(s): --- Professional ---                    [phone], Colonoscopy, flexible; diagnostic, including                     collection of specimen(s) by brushing or washing, when                     performed (separate procedure) Diagnosis Code(s): --- Professional ---                    Z12.11, Encounter for screening for malignant neoplasm of                     colon CPT copyright 2014 American Medical Association. All rights reserved. The codes documented in this report are preliminary and upon coder review may  be revised to meet current compliance requirements. Lucilla Lame, MD 07/15/2015 8:16:22 AM This report has been signed electronically. Number of Addenda: 0 Note Initiated On: 07/15/2015 7:56 AM Scope Withdrawal Time: 0 hours 7 minutes 40 seconds  Total Procedure Duration: 0 hours 13 minutes 8 seconds       [facility]

## 2015-07-15 NOTE — Telephone Encounter (Signed)
Pt would like to get a referral to go to [facility]

## 2015-07-15 NOTE — Anesthesia Postprocedure Evaluation (Signed)
Anesthesia Post Note  Patient: Rickey Glover  Procedure(s) Performed: Procedure(s) (LRB): COLONOSCOPY WITH PROPOFOL (N/A)  Patient location during evaluation: PACU Anesthesia Type: MAC Level of consciousness: awake and alert and oriented Pain management: satisfactory to patient Vital Signs Assessment: post-procedure vital signs reviewed and stable Respiratory status: spontaneous breathing, nonlabored ventilation and respiratory function stable Cardiovascular status: blood pressure returned to baseline and stable Postop Assessment: Adequate PO intake and No signs of nausea or vomiting Anesthetic complications: no    Raliegh Ip

## 2015-07-15 NOTE — Telephone Encounter (Signed)
Patient notified

## 2015-07-15 NOTE — Anesthesia Procedure Notes (Signed)
Procedure Name: MAC Performed by: Deauna Yaw Pre-anesthesia Checklist: Patient identified, Emergency Drugs available, Suction available, Timeout performed and Patient being monitored Patient Re-evaluated:Patient Re-evaluated prior to inductionOxygen Delivery Method: Nasal cannula Placement Confirmation: positive ETCO2       

## 2015-07-15 NOTE — H&P (Signed)
[facility]  [address], [name], [place] [address] Phone: [phone] Fax : [phone]  Primary Care Physician:  Golden Pop, MD Primary Gastroenterologist:  Dr. Allen Norris  Pre-Procedure History & Physical: HPI:  Rickey Glover is a 55 y.o. male is here for a screening colonoscopy.   Past Medical History  Diagnosis Date  . LAP-BAND surgery status   . Hypertension   . Hyperlipidemia   . Diabetes mellitus without complication ([place])   . Chronic back pain   . Insomnia   . Vitamin D deficiency   . Hip pain   . Knee pain, left   . Sleep apnea     new DX - has not been for CPAP titration yet  . Sciatica of right side   . DDD (degenerative disc disease), lumbar     Past Surgical History  Procedure Laterality Date  . Lap band surgery      Prior to Admission medications   Medication Sig Start Date End Date Taking? Authorizing Provider  amLODipine (NORVASC) 10 MG tablet Take 10 mg by mouth daily.   Yes Historical Provider, MD  aspirin 81 MG tablet Take 81 mg by mouth daily.   Yes Historical Provider, MD  carvedilol (COREG) 12.5 MG tablet Take 12.5 mg by mouth 2 (two) times daily with a meal.   Yes Historical Provider, MD  clonazePAM (KLONOPIN) 0.5 MG tablet Take 0.5 mg by mouth 2 (two) times daily as needed for anxiety.   Yes Historical Provider, MD  dipyridamole-aspirin (AGGRENOX) 200-25 MG 12hr capsule TK 1 C PO BID 05/27/15  Yes Historical Provider, MD  furosemide (LASIX) 40 MG tablet Take 40 mg by mouth daily.   Yes Historical Provider, MD  gabapentin (NEURONTIN) 400 MG capsule Take 1 capsule (400 mg total) by mouth 3 (three) times daily. 06/30/15  Yes Megan P Johnson, DO  glimepiride (AMARYL) 4 MG tablet Take 4 mg by mouth 2 (two) times daily.   Yes Historical Provider, MD  HYDROcodone-acetaminophen (NORCO) 10-325 MG tablet Take 1 tablet by mouth every 6 (six) hours as needed.   Yes Historical Provider, MD  meloxicam (MOBIC) 15 MG tablet TK 1 T PO QD  05/04/15  Yes Historical Provider, MD  metFORMIN (GLUCOPHAGE) 500 MG tablet Take 500 mg by mouth.   Yes Historical Provider, MD  quinapril (ACCUPRIL) 40 MG tablet Take 40 mg by mouth at bedtime.   Yes Historical Provider, MD  spironolactone (ALDACTONE) 50 MG tablet Take 50 mg by mouth daily.   Yes Historical Provider, MD  atorvastatin (LIPITOR) 80 MG tablet Take by mouth. 11/05/14   Historical Provider, MD    Allergies as of 07/12/2015 - Review Complete 07/12/2015  Allergen Reaction Noted  . Oxycodone-acetaminophen Itching 04/27/2015  . Percocet [oxycodone-acetaminophen]  06/06/2015    Family History  Problem Relation Age of Onset  . Aneurysm Mother     Brain  . Stroke Maternal Grandmother   . Stroke Paternal Grandmother     Social History   Social History  . Marital Status: Single    Spouse Name: N/A  . Number of Children: 3  . Years of Education: 12   Occupational History  .      not working, Geophysicist/field seismologist in past   Social History Main Topics  . Smoking status: Current Every Day Smoker -- 1.00 packs/day for 20 years    Types: Cigarettes  . Smokeless tobacco: Never Used  . Alcohol Use: No  . Drug Use: No  .  Sexual Activity: Not Currently    Birth Control/ Protection: None   Other Topics Concern  . Not on file   Social History Narrative   ** Merged History Encounter **       Engaged to [employer], lives at home with Pam  caffeine use- coffee- 1 cup daily, sodas- 1 daily    Review of Systems: See HPI, otherwise negative ROS  Physical Exam: BP 112/85 mmHg  Pulse 78  Temp(Src) 97 F (36.1 C) (Temporal)  Resp 17  Ht 6\' 1"  (1.854 m)  Wt 284 lb (128.822 kg)  BMI 37.48 kg/m2  SpO2 99% General:   Alert,  pleasant and cooperative in NAD Head:  Normocephalic and atraumatic. Neck:  Supple; no masses or thyromegaly. Lungs:  Clear throughout to auscultation.    Heart:  Regular rate and rhythm. Abdomen:  Soft, nontender and nondistended. Normal bowel sounds, without guarding,  and without rebound.   Neurologic:  Alert and  oriented x4;  grossly normal neurologically.  Impression/Plan: [name] is now here to undergo a screening colonoscopy.  Risks, benefits, and alternatives regarding colonoscopy have been reviewed with the patient.  Questions have been answered.  All parties agreeable.

## 2015-07-15 NOTE — Anesthesia Preprocedure Evaluation (Signed)
Anesthesia Evaluation  Patient identified by MRN, date of birth, ID band  Reviewed: Allergy & Precautions, H&P , NPO status , Patient's Chart, lab work & pertinent test results  Airway Mallampati: II  TM Distance: >3 FB Neck ROM: full    Dental  (+) Dental Advidsory Given, Poor Dentition   Pulmonary sleep apnea , Current Smoker,    Pulmonary exam normal        Cardiovascular hypertension,  Rhythm:regular Rate:Normal     Neuro/Psych    GI/Hepatic   Endo/Other  diabetes, Type 2, Oral Hypoglycemic Agents  Renal/GU      Musculoskeletal   Abdominal   Peds  Hematology   Anesthesia Other Findings   Reproductive/Obstetrics                             Anesthesia Physical Anesthesia Plan  ASA: II  Anesthesia Plan: MAC   Post-op Pain Management:    Induction:   Airway Management Planned:   Additional Equipment:   Intra-op Plan:   Post-operative Plan:   Informed Consent: I have reviewed the patients History and Physical, chart, labs and discussed the procedure including the risks, benefits and alternatives for the proposed anesthesia with the patient or authorized representative who has indicated his/her understanding and acceptance.     Plan Discussed with: CRNA  Anesthesia Plan Comments:         Anesthesia Quick Evaluation

## 2015-07-15 NOTE — Telephone Encounter (Signed)
Pt called and stated that he was told that the initial phone call would have to be made from the office.

## 2015-07-15 NOTE — Telephone Encounter (Signed)
He has a referral. He just needs to call

## 2015-07-15 NOTE — Telephone Encounter (Signed)
Forward to provider

## 2015-07-15 NOTE — Transfer of Care (Signed)
Immediate Anesthesia Transfer of Care Note  Patient: Rickey Glover  Procedure(s) Performed: Procedure(s) with comments: COLONOSCOPY WITH PROPOFOL (N/A) - Diabetic - oral meds Sleep apnea  Patient Location: PACU  Anesthesia Type: MAC  Level of Consciousness: awake, alert  and patient cooperative  Airway and Oxygen Therapy: Patient Spontanous Breathing and Patient connected to supplemental oxygen  Post-op Assessment: Post-op Vital signs reviewed, Patient's Cardiovascular Status Stable, Respiratory Function Stable, Patent Airway and No signs of Nausea or vomiting  Post-op Vital Signs: Reviewed and stable  Complications: No apparent anesthesia complications

## 2015-07-16 ENCOUNTER — Encounter: Payer: Self-pay | Admitting: Gastroenterology

## 2015-07-21 ENCOUNTER — Ambulatory Visit: Payer: Medicare Other | Admitting: Diagnostic Neuroimaging

## 2015-07-22 ENCOUNTER — Other Ambulatory Visit: Payer: Self-pay | Admitting: Family Medicine

## 2015-07-22 ENCOUNTER — Telehealth: Payer: Self-pay | Admitting: Family Medicine

## 2015-07-22 ENCOUNTER — Ambulatory Visit: Payer: Medicare Other | Admitting: Diagnostic Neuroimaging

## 2015-07-22 MED ORDER — HYDROCODONE-ACETAMINOPHEN 10-325 MG PO TABS: 1.0000 | ORAL_TABLET | Freq: Four times a day (QID) | ORAL | Status: DC | PRN

## 2015-07-22 MED ORDER — CLONAZEPAM 0.5 MG PO TABS: 0.5000 mg | ORAL_TABLET | Freq: Every day | ORAL | Status: DC

## 2015-07-22 NOTE — Progress Notes (Signed)
To come in tomorrow to pick up Rxs. To be put on 28 day program and discuss with Barnett Applebaum tomorrow.

## 2015-07-22 NOTE — Telephone Encounter (Signed)
Pt would like to get a refill on hydrocodone.

## 2015-07-22 NOTE — Telephone Encounter (Signed)
Forward to provider

## 2015-07-22 NOTE — Telephone Encounter (Signed)
He is due tomorrow and will be able to pick it up tomorrow.

## 2015-07-26 ENCOUNTER — Encounter: Payer: Self-pay | Admitting: Diagnostic Neuroimaging

## 2015-07-29 ENCOUNTER — Ambulatory Visit: Payer: Medicare Other | Admitting: Family Medicine

## 2015-07-30 ENCOUNTER — Ambulatory Visit (INDEPENDENT_AMBULATORY_CARE_PROVIDER_SITE_OTHER): Payer: Commercial Managed Care - HMO | Admitting: Family Medicine

## 2015-07-30 ENCOUNTER — Encounter: Payer: Self-pay | Admitting: Family Medicine

## 2015-07-30 VITALS — BP 107/67 | HR 64 | Temp 98.1°F | Ht 72.7 in | Wt 291.0 lb

## 2015-07-30 DIAGNOSIS — M79675 Pain in left toe(s): Secondary | ICD-10-CM

## 2015-07-30 DIAGNOSIS — I10 Essential (primary) hypertension: Secondary | ICD-10-CM | POA: Diagnosis not present

## 2015-07-30 DIAGNOSIS — G4733 Obstructive sleep apnea (adult) (pediatric): Secondary | ICD-10-CM

## 2015-07-30 DIAGNOSIS — E1165 Type 2 diabetes mellitus with hyperglycemia: Secondary | ICD-10-CM | POA: Diagnosis not present

## 2015-07-30 DIAGNOSIS — G8929 Other chronic pain: Secondary | ICD-10-CM | POA: Diagnosis not present

## 2015-07-30 LAB — BAYER DCA HB A1C WAIVED: HB A1C (BAYER DCA - WAIVED): 7.3 % — ABNORMAL HIGH (ref <7.0)

## 2015-07-30 NOTE — Progress Notes (Signed)
BP 107/67 mmHg  Pulse 64  Temp(Src) 98.1 F (36.7 C)  Ht 6' 0.7" (1.847 m)  Wt 291 lb (131.997 kg)  BMI 38.69 kg/m2  SpO2 97%   Subjective:    Patient ID: Rickey Glover, male    DOB: 10-27-1960, 55 y.o.   MRN: [id number]  HPI: Rickey Glover is a 55 y.o. male  Chief Complaint  Patient presents with  . Diabetes  . pain in left big toe   SLEEP APNEA- got titrated on on his CPAP recently- hasn't got the CPAP because [facility] doesn't take his insurance after he switched.  TOE PAIN Duration: couple of weeks Involved toe: leftbig toe  Mechanism of injury: unknown Onset: sudden Severity: mild  Quality: sharp and throbbing Frequency: intermittent Radiation: no Aggravating factors:nothing   Alleviating factors: nothing   Status: better Treatments attempted: nothing  Relief with NSAIDs?: No NSAIDs Taken Morning stiffness: no Redness: no  Bruising: no Swelling: no Paresthesias / decreased sensation: no Fevers: no  CHRONIC PAIN- Went to see the hip doctor yesterday, but couldn't see him. Going to see him on 2/22  Present dose:  40 Morphine equivalents Pain control status: stable Duration: chronic Location: R hip Quality: sharp, aching and throbbing Current Pain Level: 8/10 Previous Pain Level: 10/10 Breakthrough pain: yes Benefit from narcotic medications: sometimes Interested in weaning off narcotics:no  Stool softners/OTC fiber: no  Previous pain specialty evaluation: yes Non-narcotic analgesic meds: yes, gabapentin not helping all that much Narcotic contract: yes  DIABETES Hypoglycemic episodes:no Polydipsia/polyuria: no Visual disturbance: no Chest pain: no Paresthesias: no Glucose Monitoring: no Taking Insulin?: no Blood Pressure Monitoring: not checking Retinal Examination: Not up to date Foot Exam: Up to Date Diabetic Education: Not Completed Pneumovax: Up to Date Influenza: Up to Date Aspirin: no  Relevant past medical, surgical,  family and social history reviewed and updated as indicated. Interim medical history since our last visit reviewed. Allergies and medications reviewed and updated.  Review of Systems  Constitutional: Negative.   Respiratory: Negative.   Cardiovascular: Negative.   Gastrointestinal: Negative.   Musculoskeletal: Positive for myalgias, back pain, arthralgias and gait problem. Negative for joint swelling, neck pain and neck stiffness.  Psychiatric/Behavioral: Negative.    Per HPI unless specifically indicated above     Objective:    BP 107/67 mmHg  Pulse 64  Temp(Src) 98.1 F (36.7 C)  Ht 6' 0.7" (1.847 m)  Wt 291 lb (131.997 kg)  BMI 38.69 kg/m2  SpO2 97%  Wt Readings from Last 3 Encounters:  07/30/15 291 lb (131.997 kg)  07/15/15 284 lb (128.822 kg)  06/30/15 280 lb (127.007 kg)    Physical Exam  Constitutional: He is oriented to person, place, and time. He appears well-developed and well-nourished. No distress.  HENT:  Head: Normocephalic and atraumatic.  Right Ear: Hearing normal.  Left Ear: Hearing normal.  Nose: Nose normal.  Eyes: Conjunctivae and lids are normal. Right eye exhibits no discharge. Left eye exhibits no discharge. No scleral icterus.  Cardiovascular: Normal rate, regular rhythm, normal heart sounds and intact distal pulses.  Exam reveals no gallop and no friction rub.   No murmur heard. Pulmonary/Chest: Effort normal and breath sounds normal. No respiratory distress. He has no wheezes. He has no rales. He exhibits no tenderness.  Musculoskeletal: He exhibits tenderness.  Antalgic gait. No redness or swelling of L great toe  Neurological: He is alert and oriented to person, place, and time.  Skin: Skin is warm, dry and intact.  No rash noted. No erythema. No pallor.  Psychiatric: He has a normal mood and affect. His speech is normal and behavior is normal. Judgment and thought content normal. Cognition and memory are normal.  Nursing note and vitals  reviewed.   Results for orders placed or performed in visit on 07/30/15  Bayer DCA Hb A1c Waived  Result Value Ref Range   Bayer DCA Hb A1c Waived 7.3 (H) <7.0 %      Assessment & Plan:   Problem List Items Addressed This Visit      Cardiovascular and Mediastinum   Essential hypertension    Under good control. Continue current regimen. Continue to monitor.         Respiratory   OSA (obstructive sleep apnea)    Will call his insurance to see where he can get a CPAP machine. Has already been titrated so only needs machine for now. Keep in contact with [place].        Endocrine   Type 2 diabetes mellitus with hyperglycemia, without long-term current use of insulin (H[facility]) - Primary    A1c going wrong direction up to 7.3- work on diet and exercise. Recheck 6 months. Continue to monitor.       Relevant Orders   Bayer DCA Hb A1c Waived (Completed)     Other   Chronic pain    Stable on his current regimen. Seeing orthopedics and to have hip replacement. He hasn't seen them yet to discuss this- going next week. Discussed the dangers of increasing his medication. He is aware and would like to stay at present dose right now. Continue to monitor. Recheck 3 months at DM visit.        Other Visit Diagnoses    Pain of toe of left foot        No sign of gout. Will check uric acid. Likely arthritis. Continue to monitor. Call with any concerns.     Relevant Orders    Uric acid        Follow up plan: Return in about 3 months (around 10/27/2015) for DM and chronic pain.

## 2015-07-30 NOTE — Assessment & Plan Note (Signed)
Will call his insurance to see where he can get a CPAP machine. Has already been titrated so only needs machine for now. Keep in contact with [place].

## 2015-07-30 NOTE — Assessment & Plan Note (Signed)
Under good control. Continue current regimen. Continue to monitor.  

## 2015-07-30 NOTE — Assessment & Plan Note (Signed)
A1c going wrong direction up to 7.3- work on diet and exercise. Recheck 6 months. Continue to monitor.

## 2015-07-30 NOTE — Assessment & Plan Note (Signed)
Stable on his current regimen. Seeing orthopedics and to have hip replacement. He hasn't seen them yet to discuss this- going next week. Discussed the dangers of increasing his medication. He is aware and would like to stay at present dose right now. Continue to monitor. Recheck 3 months at DM visit.

## 2015-07-31 LAB — URIC ACID: Uric Acid: 7.4 mg/dL (ref 3.7–8.6)

## 2015-08-06 DIAGNOSIS — M1712 Unilateral primary osteoarthritis, left knee: Secondary | ICD-10-CM | POA: Diagnosis not present

## 2015-08-09 ENCOUNTER — Other Ambulatory Visit: Payer: Self-pay | Admitting: Family Medicine

## 2015-08-09 MED ORDER — CLONAZEPAM 0.5 MG PO TABS: 0.5000 mg | ORAL_TABLET | Freq: Every day | ORAL | Status: DC

## 2015-08-09 MED ORDER — HYDROCODONE-ACETAMINOPHEN 10-325 MG PO TABS: 1.0000 | ORAL_TABLET | Freq: Four times a day (QID) | ORAL | Status: DC | PRN

## 2015-08-15 ENCOUNTER — Encounter: Payer: Self-pay | Admitting: Emergency Medicine

## 2015-08-15 ENCOUNTER — Emergency Department
Admission: EM | Admit: 2015-08-15 | Discharge: 2015-08-15 | Disposition: A | Discharge status: Home / Self Care | Payer: Medicare Other | Attending: Emergency Medicine | Admitting: Emergency Medicine

## 2015-08-15 ENCOUNTER — Emergency Department: Payer: Medicare Other

## 2015-08-15 DIAGNOSIS — I10 Essential (primary) hypertension: Secondary | ICD-10-CM | POA: Insufficient documentation

## 2015-08-15 DIAGNOSIS — Z7984 Long term (current) use of oral hypoglycemic drugs: Secondary | ICD-10-CM | POA: Insufficient documentation

## 2015-08-15 DIAGNOSIS — Y92410 Unspecified street and highway as the place of occurrence of the external cause: Secondary | ICD-10-CM | POA: Diagnosis not present

## 2015-08-15 DIAGNOSIS — Z7982 Long term (current) use of aspirin: Secondary | ICD-10-CM | POA: Diagnosis not present

## 2015-08-15 DIAGNOSIS — S161XXA Strain of muscle, fascia and tendon at neck level, initial encounter: Secondary | ICD-10-CM | POA: Diagnosis not present

## 2015-08-15 DIAGNOSIS — S199XXA Unspecified injury of neck, initial encounter: Secondary | ICD-10-CM | POA: Diagnosis present

## 2015-08-15 DIAGNOSIS — Y9389 Activity, other specified: Secondary | ICD-10-CM | POA: Insufficient documentation

## 2015-08-15 DIAGNOSIS — Y998 Other external cause status: Secondary | ICD-10-CM | POA: Diagnosis not present

## 2015-08-15 DIAGNOSIS — E785 Hyperlipidemia, unspecified: Secondary | ICD-10-CM | POA: Insufficient documentation

## 2015-08-15 DIAGNOSIS — M542 Cervicalgia: Secondary | ICD-10-CM | POA: Diagnosis not present

## 2015-08-15 DIAGNOSIS — F17210 Nicotine dependence, cigarettes, uncomplicated: Secondary | ICD-10-CM | POA: Insufficient documentation

## 2015-08-15 DIAGNOSIS — E1165 Type 2 diabetes mellitus with hyperglycemia: Secondary | ICD-10-CM | POA: Diagnosis not present

## 2015-08-15 DIAGNOSIS — S60812A Abrasion of left wrist, initial encounter: Secondary | ICD-10-CM | POA: Diagnosis not present

## 2015-08-15 DIAGNOSIS — S39012A Strain of muscle, fascia and tendon of lower back, initial encounter: Secondary | ICD-10-CM

## 2015-08-15 DIAGNOSIS — Z791 Long term (current) use of non-steroidal anti-inflammatories (NSAID): Secondary | ICD-10-CM | POA: Insufficient documentation

## 2015-08-15 DIAGNOSIS — Z79899 Other long term (current) drug therapy: Secondary | ICD-10-CM | POA: Diagnosis not present

## 2015-08-15 DIAGNOSIS — M545 Low back pain: Secondary | ICD-10-CM | POA: Diagnosis not present

## 2015-08-15 MED ORDER — KETOROLAC TROMETHAMINE 10 MG PO TABS: 10.0000 mg | ORAL_TABLET | Freq: Four times a day (QID) | ORAL | Status: DC | PRN

## 2015-08-15 MED ORDER — METHOCARBAMOL 750 MG PO TABS: 750.0000 mg | ORAL_TABLET | Freq: Four times a day (QID) | ORAL | Status: DC

## 2015-08-15 MED ORDER — KETOROLAC TROMETHAMINE 60 MG/2ML IM SOLN
60.0000 mg | Freq: Once | INTRAMUSCULAR | Status: AC
  Administered 2015-08-15: 60 mg via INTRAMUSCULAR
  Filled 2015-08-15: qty 2

## 2015-08-15 MED ORDER — METHOCARBAMOL 500 MG PO TABS
1000.0000 mg | ORAL_TABLET | Freq: Once | ORAL | Status: AC
  Administered 2015-08-15: 1000 mg via ORAL
  Filled 2015-08-15: qty 2

## 2015-08-15 MED ORDER — TRAMADOL HCL 50 MG PO TABS: 50.0000 mg | ORAL_TABLET | Freq: Four times a day (QID) | ORAL | Status: DC | PRN

## 2015-08-15 MED ORDER — TRAMADOL HCL 50 MG PO TABS
50.0000 mg | ORAL_TABLET | Freq: Once | ORAL | Status: AC
  Administered 2015-08-15: 50 mg via ORAL
  Filled 2015-08-15: qty 1
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
## 2015-08-15 NOTE — ED Notes (Signed)
AAOx3.  Skin warm and dry. NAD.  Ambulates with easy and steady gait.  MAE equally and strong. 
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
## 2015-08-15 NOTE — Discharge Instructions (Signed)

## 2015-08-17 ENCOUNTER — Ambulatory Visit (INDEPENDENT_AMBULATORY_CARE_PROVIDER_SITE_OTHER): Payer: Commercial Managed Care - HMO | Admitting: Family Medicine

## 2015-08-17 ENCOUNTER — Encounter: Payer: Self-pay | Admitting: Family Medicine

## 2015-08-17 ENCOUNTER — Telehealth: Payer: Self-pay | Admitting: Family Medicine

## 2015-08-17 VITALS — BP 124/79 | HR 66 | Temp 97.4°F | Ht 72.7 in | Wt 288.0 lb

## 2015-08-17 DIAGNOSIS — M5136 Other intervertebral disc degeneration, lumbar region: Secondary | ICD-10-CM

## 2015-08-17 DIAGNOSIS — S134XXA Sprain of ligaments of cervical spine, initial encounter: Secondary | ICD-10-CM | POA: Diagnosis not present

## 2015-08-17 DIAGNOSIS — G8929 Other chronic pain: Secondary | ICD-10-CM
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
## 2015-08-17 NOTE — Assessment & Plan Note (Signed)
To get disc with images for orthopedist. Continue to follow with them. Muscle relaxer and ketorlac as prescribed. Monitor closely call if not getting better. Follow up with ortho as recommended.

## 2015-08-17 NOTE — Telephone Encounter (Signed)
Patient needs a authorization through [facility]

## 2015-08-17 NOTE — Telephone Encounter (Signed)
Patient called and stated that he went to [facility] and told him he needed a referral from his PCP before he can be seen.

## 2015-08-17 NOTE — Telephone Encounter (Signed)
Authorization obtained and faxed to [facility].  Approval # [id number]

## 2015-08-17 NOTE — Assessment & Plan Note (Signed)
Advised him that we will not increase his pain medicine at this time. Take non-narcotic pain medicine and do PT. Continue to monitor.

## 2015-08-20 DIAGNOSIS — H2513 Age-related nuclear cataract, bilateral: Secondary | ICD-10-CM | POA: Diagnosis not present

## 2015-08-20 LAB — HM DIABETES EYE EXAM

## 2015-08-24 DIAGNOSIS — M542 Cervicalgia: Secondary | ICD-10-CM | POA: Diagnosis not present

## 2015-08-24 DIAGNOSIS — M545 Low back pain: Secondary | ICD-10-CM | POA: Diagnosis not present

## 2015-08-31 ENCOUNTER — Telehealth: Payer: Self-pay | Admitting: Family Medicine

## 2015-08-31 MED ORDER — SPIRONOLACTONE 50 MG PO TABS: 50.0000 mg | ORAL_TABLET | Freq: Every day | ORAL | Status: DC

## 2015-08-31 NOTE — Telephone Encounter (Signed)
Forward to provider

## 2015-08-31 NOTE — Telephone Encounter (Signed)
Pt would like refill on spironolactone (ALDACTONE) 50 MG tablet sent to [facility] [place]

## 2015-08-31 NOTE — Telephone Encounter (Signed)
Rx sent to his pharmacy

## 2015-09-03 DIAGNOSIS — M5136 Other intervertebral disc degeneration, lumbar region: Secondary | ICD-10-CM | POA: Diagnosis not present

## 2015-09-03 DIAGNOSIS — M4806 Spinal stenosis, lumbar region: Secondary | ICD-10-CM | POA: Diagnosis not present

## 2015-09-03 DIAGNOSIS — M1611 Unilateral primary osteoarthritis, right hip: Secondary | ICD-10-CM | POA: Diagnosis not present

## 2015-09-07 ENCOUNTER — Other Ambulatory Visit: Payer: Self-pay | Admitting: Family Medicine

## 2015-09-07 DIAGNOSIS — M542 Cervicalgia: Secondary | ICD-10-CM | POA: Diagnosis not present

## 2015-09-07 DIAGNOSIS — M545 Low back pain: Secondary | ICD-10-CM | POA: Diagnosis not present

## 2015-09-07 MED ORDER — CLONAZEPAM 0.5 MG PO TABS: 0.5000 mg | ORAL_TABLET | Freq: Every day | ORAL | Status: DC

## 2015-09-07 MED ORDER — HYDROCODONE-ACETAMINOPHEN 10-325 MG PO TABS: 1.0000 | ORAL_TABLET | Freq: Four times a day (QID) | ORAL | Status: DC | PRN

## 2015-09-09 DIAGNOSIS — M542 Cervicalgia: Secondary | ICD-10-CM | POA: Diagnosis not present

## 2015-09-09 DIAGNOSIS — M545 Low back pain: Secondary | ICD-10-CM | POA: Diagnosis not present

## 2015-09-10 DIAGNOSIS — G4730 Sleep apnea, unspecified: Secondary | ICD-10-CM | POA: Diagnosis not present

## 2015-09-10 DIAGNOSIS — I10 Essential (primary) hypertension: Secondary | ICD-10-CM | POA: Diagnosis not present

## 2015-09-10 DIAGNOSIS — F17200 Nicotine dependence, unspecified, uncomplicated: Secondary | ICD-10-CM | POA: Diagnosis not present

## 2015-09-13 DIAGNOSIS — M545 Low back pain: Secondary | ICD-10-CM | POA: Diagnosis not present

## 2015-09-13 DIAGNOSIS — M542 Cervicalgia: Secondary | ICD-10-CM | POA: Diagnosis not present

## 2015-09-14 ENCOUNTER — Encounter: Payer: Self-pay | Admitting: Family Medicine

## 2015-09-14 ENCOUNTER — Ambulatory Visit (INDEPENDENT_AMBULATORY_CARE_PROVIDER_SITE_OTHER): Payer: Commercial Managed Care - HMO | Admitting: Family Medicine

## 2015-09-14 VITALS — BP 110/77 | HR 81 | Temp 97.8°F | Ht 72.1 in | Wt 291.0 lb

## 2015-09-14 DIAGNOSIS — Z01818 Encounter for other preprocedural examination: Secondary | ICD-10-CM

## 2015-09-14 DIAGNOSIS — I10 Essential (primary) hypertension: Secondary | ICD-10-CM | POA: Diagnosis not present

## 2015-09-14 NOTE — Progress Notes (Signed)
BP 110/77 mmHg  Pulse 81  Temp(Src) 97.8 F (36.6 C)  Ht 6' 0.1" (1.831 m)  Wt 291 lb (131.997 kg)  BMI 39.37 kg/m2  SpO2 97%   Subjective:    Patient ID: Rickey Glover, male    DOB: 1960-08-19, 55 y.o.   MRN: [id number]  HPI: Rickey Glover is a 55 y.o. male  Chief Complaint  Patient presents with  . surgical clearance   Rickey Glover presents today for evaluation prior to surgery. He is due to have surgery on Thursday and needs clearance. He went to the orthopedist's office yesterday and had an EKG which we do not have available to [place]. He is also going to need to have a total hip replacement, but he is not scheduled for this now. He notes that he is feeling well except his hip is hurting. He had problems with waking up during an ERCP. Has had no problems with anesthesia. Going for surgery on Thursday. Feeling well. Has had several surgeries before with no issues. He is feeling OK. No other concerns or complaints today.  Relevant past medical, surgical, family and social history reviewed and updated as indicated. Interim medical history since our last visit reviewed. Allergies and medications reviewed and updated.  Review of Systems  Constitutional: Negative.   Respiratory: Negative.   Cardiovascular: Negative.   Genitourinary: Negative.   Musculoskeletal: Positive for myalgias, back pain and arthralgias. Negative for joint swelling, gait problem, neck pain and neck stiffness.  Psychiatric/Behavioral: Negative.     Per HPI unless specifically indicated above     Objective:    BP 110/77 mmHg  Pulse 81  Temp(Src) 97.8 F (36.6 C)  Ht 6' 0.1" (1.831 m)  Wt 291 lb (131.997 kg)  BMI 39.37 kg/m2  SpO2 97%  Wt Readings from Last 3 Encounters:  09/14/15 291 lb (131.997 kg)  08/17/15 288 lb (130.636 kg)  08/15/15 280 lb (127.007 kg)    Physical Exam  Constitutional: He is oriented to person, place, and time. He appears well-developed and well-nourished. No distress.   HENT:  Head: Normocephalic and atraumatic.  Right Ear: Hearing normal.  Left Ear: Hearing normal.  Nose: Nose normal.  Eyes: Conjunctivae and lids are normal. Right eye exhibits no discharge. Left eye exhibits no discharge. No scleral icterus.  Cardiovascular: Normal rate, regular rhythm, normal heart sounds and intact distal pulses.  Exam reveals no gallop and no friction rub.   No murmur heard. Pulmonary/Chest: Effort normal and breath sounds normal. No respiratory distress. He has no wheezes. He has no rales. He exhibits no tenderness.  Musculoskeletal:  Antalgic gait  Neurological: He is alert and oriented to person, place, and time.  Skin: Skin is warm, dry and intact. No rash noted. No erythema. No pallor.  Psychiatric: He has a normal mood and affect. His speech is normal and behavior is normal. Judgment and thought content normal. Cognition and memory are normal.  Nursing note and vitals reviewed.   Results for orders placed or performed in visit on 08/23/15  HM DIABETES EYE EXAM  Result Value Ref Range   HM Diabetic Eye Exam No Retinopathy No Retinopathy  HM DIABETES EYE EXAM  Result Value Ref Range   HM Diabetic Eye Exam No Retinopathy No Retinopathy      Assessment & Plan:   Problem List Items Addressed This Visit      Cardiovascular and Mediastinum   Essential hypertension    Under good control. Continue current regimen. Continue to monitor.  Relevant Orders   EKG 12-Lead (Completed)    Other Visit Diagnoses    Preop examination    -  Primary    Cleared for surgery. Call with any problems.         Follow up plan: Return in about 2 months (around 11/14/2015) for DM visit.

## 2015-09-14 NOTE — Assessment & Plan Note (Signed)
Under good control. Continue current regimen. Continue to monitor.  

## 2015-09-16 ENCOUNTER — Encounter: Payer: Commercial Managed Care - HMO | Admitting: Family Medicine

## 2015-09-16 DIAGNOSIS — M4806 Spinal stenosis, lumbar region: Secondary | ICD-10-CM | POA: Diagnosis not present

## 2015-09-16 DIAGNOSIS — M7989 Other specified soft tissue disorders: Secondary | ICD-10-CM | POA: Diagnosis not present

## 2015-09-16 DIAGNOSIS — G4733 Obstructive sleep apnea (adult) (pediatric): Secondary | ICD-10-CM | POA: Diagnosis not present

## 2015-09-16 DIAGNOSIS — M5186 Other intervertebral disc disorders, lumbar region: Secondary | ICD-10-CM | POA: Diagnosis not present

## 2015-09-16 DIAGNOSIS — F17210 Nicotine dependence, cigarettes, uncomplicated: Secondary | ICD-10-CM | POA: Diagnosis not present

## 2015-09-16 DIAGNOSIS — E119 Type 2 diabetes mellitus without complications: Secondary | ICD-10-CM | POA: Diagnosis not present

## 2015-09-16 DIAGNOSIS — Z7984 Long term (current) use of oral hypoglycemic drugs: Secondary | ICD-10-CM | POA: Diagnosis not present

## 2015-09-16 DIAGNOSIS — E7800 Pure hypercholesterolemia, unspecified: Secondary | ICD-10-CM | POA: Diagnosis not present

## 2015-09-16 DIAGNOSIS — Z9884 Bariatric surgery status: Secondary | ICD-10-CM | POA: Diagnosis not present

## 2015-09-16 DIAGNOSIS — M5136 Other intervertebral disc degeneration, lumbar region: Secondary | ICD-10-CM | POA: Diagnosis not present

## 2015-09-17 DIAGNOSIS — M7989 Other specified soft tissue disorders: Secondary | ICD-10-CM | POA: Diagnosis not present

## 2015-09-17 DIAGNOSIS — Z7984 Long term (current) use of oral hypoglycemic drugs: Secondary | ICD-10-CM | POA: Diagnosis not present

## 2015-09-17 DIAGNOSIS — M4806 Spinal stenosis, lumbar region: Secondary | ICD-10-CM | POA: Diagnosis not present

## 2015-09-17 DIAGNOSIS — G4733 Obstructive sleep apnea (adult) (pediatric): Secondary | ICD-10-CM | POA: Diagnosis not present

## 2015-09-17 DIAGNOSIS — F17210 Nicotine dependence, cigarettes, uncomplicated: Secondary | ICD-10-CM | POA: Diagnosis not present

## 2015-09-17 DIAGNOSIS — Z9884 Bariatric surgery status: Secondary | ICD-10-CM | POA: Diagnosis not present

## 2015-09-17 DIAGNOSIS — E7800 Pure hypercholesterolemia, unspecified: Secondary | ICD-10-CM | POA: Diagnosis not present

## 2015-09-17 DIAGNOSIS — E119 Type 2 diabetes mellitus without complications: Secondary | ICD-10-CM | POA: Diagnosis not present

## 2015-09-18 HISTORY — PX: LUMBAR SPINE SURGERY: SHX701

## 2015-09-19 DIAGNOSIS — Z5181 Encounter for therapeutic drug level monitoring: Secondary | ICD-10-CM | POA: Diagnosis not present

## 2015-09-19 DIAGNOSIS — F17210 Nicotine dependence, cigarettes, uncomplicated: Secondary | ICD-10-CM | POA: Diagnosis not present

## 2015-09-19 DIAGNOSIS — M96830 Postprocedural hemorrhage and hematoma of a musculoskeletal structure following a musculoskeletal system procedure: Secondary | ICD-10-CM | POA: Diagnosis not present

## 2015-09-19 DIAGNOSIS — Z4889 Encounter for other specified surgical aftercare: Secondary | ICD-10-CM | POA: Diagnosis not present

## 2015-09-19 DIAGNOSIS — Z79899 Other long term (current) drug therapy: Secondary | ICD-10-CM | POA: Diagnosis not present

## 2015-09-19 DIAGNOSIS — I10 Essential (primary) hypertension: Secondary | ICD-10-CM | POA: Diagnosis not present

## 2015-09-19 DIAGNOSIS — M96831 Postprocedural hemorrhage and hematoma of a musculoskeletal structure following other procedure: Secondary | ICD-10-CM | POA: Diagnosis not present

## 2015-09-19 DIAGNOSIS — Z7982 Long term (current) use of aspirin: Secondary | ICD-10-CM | POA: Diagnosis not present

## 2015-09-20 ENCOUNTER — Other Ambulatory Visit: Payer: Self-pay | Admitting: Family Medicine

## 2015-09-20 ENCOUNTER — Telehealth: Payer: Self-pay | Admitting: Family Medicine

## 2015-09-20 MED ORDER — CLONAZEPAM 0.5 MG PO TABS: 0.5000 mg | ORAL_TABLET | Freq: Every day | ORAL | Status: DC

## 2015-09-20 NOTE — Telephone Encounter (Signed)
Forward to provider

## 2015-09-20 NOTE — Telephone Encounter (Signed)
[facility] called [facility]) to inform that they have written pt an RX for Oxycodone. They stated our office can take over the RX in a couple of weeks for pain management. Thanks.

## 2015-09-20 NOTE — Telephone Encounter (Signed)
Noted  

## 2015-09-24 ENCOUNTER — Telehealth: Payer: Self-pay | Admitting: Family Medicine

## 2015-09-24 DIAGNOSIS — E1165 Type 2 diabetes mellitus with hyperglycemia: Secondary | ICD-10-CM

## 2015-09-24 NOTE — Telephone Encounter (Signed)
The podiatry referral was to get his toenails trimmed. He stated that he had been trying to keep them done but there are 2 nails that he is afraid to cut. He scheduled an appt with [facility] for May 8th but he would like to go somewhere else sooner if at all possible. Pt already see's Dr Joselyn Arrow for ortho needs but i wasn't sure if the clinic had multiple offices within the same building. I can find out if you'd like.

## 2015-09-24 NOTE — Telephone Encounter (Signed)
Pt previously went to podiatry in [place] and he would like to have referral for [facility] in [place] instead of [place].

## 2015-09-24 NOTE — Telephone Encounter (Signed)
If there is someone in there for podiatry, give me the name and I'll get the referral for him. Thanks.

## 2015-09-24 NOTE — Telephone Encounter (Signed)
Ortho is different that podiatry, what does he need to be seen by podiatry for?

## 2015-09-27 NOTE — Telephone Encounter (Signed)
Referral generated today. 

## 2015-09-27 NOTE — Telephone Encounter (Signed)
There is a podiatrist office in the same complex. The podiatrist is Sharmon Revere, phone [phone].

## 2015-10-04 ENCOUNTER — Other Ambulatory Visit: Payer: Self-pay | Admitting: Family Medicine

## 2015-10-04 ENCOUNTER — Ambulatory Visit (INDEPENDENT_AMBULATORY_CARE_PROVIDER_SITE_OTHER): Payer: Commercial Managed Care - HMO | Admitting: Family Medicine

## 2015-10-04 ENCOUNTER — Encounter: Payer: Self-pay | Admitting: Family Medicine

## 2015-10-04 VITALS — BP 108/71 | HR 75 | Temp 97.9°F | Ht 73.0 in | Wt 283.0 lb

## 2015-10-04 DIAGNOSIS — Z125 Encounter for screening for malignant neoplasm of prostate: Secondary | ICD-10-CM | POA: Diagnosis not present

## 2015-10-04 DIAGNOSIS — G8929 Other chronic pain: Secondary | ICD-10-CM | POA: Diagnosis not present

## 2015-10-04 DIAGNOSIS — Z0000 Encounter for general adult medical examination without abnormal findings: Secondary | ICD-10-CM

## 2015-10-04 DIAGNOSIS — G4700 Insomnia, unspecified: Secondary | ICD-10-CM | POA: Diagnosis not present

## 2015-10-04 DIAGNOSIS — E1165 Type 2 diabetes mellitus with hyperglycemia: Secondary | ICD-10-CM

## 2015-10-04 DIAGNOSIS — E785 Hyperlipidemia, unspecified: Secondary | ICD-10-CM | POA: Diagnosis not present

## 2015-10-04 DIAGNOSIS — I10 Essential (primary) hypertension: Secondary | ICD-10-CM | POA: Diagnosis not present

## 2015-10-04 LAB — UA/M W/RFLX CULTURE, ROUTINE
Bilirubin, UA: NEGATIVE
Glucose, UA: NEGATIVE
KETONES UA: NEGATIVE
Leukocytes, UA: NEGATIVE
Nitrite, UA: NEGATIVE
Protein, UA: NEGATIVE
RBC UA: NEGATIVE
SPEC GRAV UA: 1.02 (ref 1.005–1.030)
UUROB: 0.2 mg/dL (ref 0.2–1.0)
pH, UA: 5 (ref 5.0–7.5)

## 2015-10-04 LAB — MICROALBUMIN, URINE WAIVED
CREATININE, URINE WAIVED: 200 mg/dL (ref 10–300)
Microalb, Ur Waived: 10 mg/L (ref 0–19)

## 2015-10-04 MED ORDER — CLONAZEPAM 0.5 MG PO TABS: 0.5000 mg | ORAL_TABLET | Freq: Every day | ORAL | Status: DC

## 2015-10-04 NOTE — Assessment & Plan Note (Signed)
Under good control on current regimen. Continue to monitor. Recheck  15months.

## 2015-10-04 NOTE — Progress Notes (Signed)
BP 108/71 mmHg  Pulse 75  Temp(Src) 97.9 F (36.6 C)  Ht 6\' 1"  (1.854 m)  Wt 283 lb (128.368 kg)  BMI 37.35 kg/m2  SpO2 98%   Subjective:    Patient ID: Rickey Glover, male    DOB: [birth year], 55 y.o.   MRN: [id number]  HPI: Rickey Glover is a 55 y.o. male presenting on 10/04/2015 for comprehensive medical examination. Current medical complaints include:none  He currently lives with: Interim Problems from his last visit: had your back surgery which went well.  Depression Screen done today and results listed below:  Depression screen [facility] 2/9 10/04/2015 04/27/2015 04/27/2015  Decreased Interest 0 0 0  Down, Depressed, Hopeless 0 0 0  PHQ - 2 Score 0 0 0  Altered sleeping - 3 -  Tired, decreased energy - 3 -  Change in appetite - 0 -  Feeling bad or failure about yourself  - 0 -  Trouble concentrating - 0 -  Moving slowly or fidgety/restless - 0 -  Suicidal thoughts - 0 -  PHQ-9 Score - 6 -  Difficult doing work/chores - Somewhat difficult -    The patient does not have a history of falls. I did not complete a risk assessment for falls. A plan of care for falls was not documented.   Past Medical History:  Past Medical History  Diagnosis Date  . LAP-BAND surgery status   . Hypertension   . Hyperlipidemia   . Diabetes mellitus without complication ([place])   . Chronic back pain   . Insomnia   . Vitamin D deficiency   . Hip pain   . Knee pain, left   . Sleep apnea     new DX - has not been for CPAP titration yet  . Sciatica of right side   . DDD (degenerative disc disease), lumbar     Surgical History:  Past Surgical History  Procedure Laterality Date  . Lap band surgery    . Colonoscopy with propofol N/A 07/15/2015    Procedure: COLONOSCOPY WITH PROPOFOL;  Surgeon: Lucilla Lame, MD;  Location: [facility];  Service: Endoscopy;  Laterality: N/A;  Diabetic - oral meds Sleep apnea  . Lumbar spine surgery  April 2017    Medications:  Current Outpatient  Prescriptions on File Prior to Visit  Medication Sig  . amLODipine (NORVASC) 10 MG tablet Take 10 mg by mouth daily.  Marland Kitchen aspirin 81 MG tablet Take 81 mg by mouth daily.  Marland Kitchen atorvastatin (LIPITOR) 80 MG tablet Take by mouth.  . carvedilol (COREG) 12.5 MG tablet Take 12.5 mg by mouth 2 (two) times daily with a meal.  . dipyridamole-aspirin (AGGRENOX) 200-25 MG 12hr capsule Reported on 08/17/2015  . furosemide (LASIX) 40 MG tablet Take 40 mg by mouth daily.  Marland Kitchen gabapentin (NEURONTIN) 400 MG capsule Take 1 capsule (400 mg total) by mouth 3 (three) times daily.  Marland Kitchen glimepiride (AMARYL) 4 MG tablet Take 4 mg by mouth 2 (two) times daily.  Marland Kitchen HYDROcodone-acetaminophen (NORCO) 10-325 MG tablet Take 1 tablet by mouth every 6 (six) hours as needed.  . meloxicam (MOBIC) 15 MG tablet TK 1 T PO QD  . metFORMIN (GLUCOPHAGE-XR) 500 MG 24 hr tablet TAKE 2 TABLETS BY MOUTH TWICE DAILY WITH MEALS  . quinapril (ACCUPRIL) 40 MG tablet Take 40 mg by mouth at bedtime.  Marland Kitchen spironolactone (ALDACTONE) 50 MG tablet Take 1 tablet (50 mg total) by mouth daily.   No current facility-administered medications on file prior  to visit.    Allergies:  Allergies  Allergen Reactions  . Percocet [Oxycodone-Acetaminophen]     Social History:  Social History   Social History  . Marital Status: Single    Spouse Name: N/A  . Number of Children: 3  . Years of Education: 12   Occupational History  .      not working, Geophysicist/field seismologist in past   Social History Main Topics  . Smoking status: Current Every Day Smoker -- 1.00 packs/day for 20 years    Types: Cigarettes  . Smokeless tobacco: Never Used  . Alcohol Use: No  . Drug Use: No  . Sexual Activity: Not Currently    Birth Control/ Protection: None   Other Topics Concern  . Not on file   Social History Narrative   ** Merged History Encounter **       Engaged to [employer], lives at home with Pam  caffeine use- coffee- 1 cup daily, sodas- 1 daily   History  Smoking status  .  Current Every Day Smoker -- 1.00 packs/day for 20 years  . Types: Cigarettes  Smokeless tobacco  . Never Used   History  Alcohol Use No    Family History:  Family History  Problem Relation Age of Onset  . Aneurysm Mother     Brain  . Stroke Maternal Grandmother   . Stroke Paternal Grandmother     Past medical history, surgical history, medications, allergies, family history and social history reviewed with patient today and changes made to appropriate areas of the chart.   Review of Systems  Constitutional: Negative.   HENT: Negative.   Eyes: Negative.   Respiratory: Negative.   Cardiovascular: Negative.   Gastrointestinal: Negative.   Genitourinary: Negative.   Musculoskeletal: Positive for myalgias, back pain and joint pain. Negative for falls and neck pain.  Skin: Negative.   Neurological: Negative.   Endo/Heme/Allergies: Negative.   Psychiatric/Behavioral: Negative.    All other ROS negative except what is listed above and in the HPI.      Objective:    BP 108/71 mmHg  Pulse 75  Temp(Src) 97.9 F (36.6 C)  Ht 6\' 1"  (1.854 m)  Wt 283 lb (128.368 kg)  BMI 37.35 kg/m2  SpO2 98%  Wt Readings from Last 3 Encounters:  10/04/15 283 lb (128.368 kg)  09/14/15 291 lb (131.997 kg)  08/17/15 288 lb (130.636 kg)    Physical Exam  Constitutional: He is oriented to person, place, and time. He appears well-developed and well-nourished. No distress.  HENT:  Head: Normocephalic and atraumatic.  Right Ear: Hearing and external ear normal.  Left Ear: Hearing and external ear normal.  Nose: Nose normal.  Mouth/Throat: Oropharynx is clear and moist. No oropharyngeal exudate.  Eyes: Conjunctivae, EOM and lids are normal. Pupils are equal, round, and reactive to light. Right eye exhibits no discharge. Left eye exhibits no discharge. No scleral icterus.  Neck: Normal range of motion. Neck supple. No JVD present. No tracheal deviation present. No thyromegaly present.   Cardiovascular: Normal rate, regular rhythm, normal heart sounds and intact distal pulses.  Exam reveals no gallop and no friction rub.   No murmur heard. Pulmonary/Chest: Effort normal and breath sounds normal. No stridor. No respiratory distress. He has no wheezes. He has no rales. He exhibits no tenderness.  Abdominal: Soft. Bowel sounds are normal. He exhibits no distension and no mass. There is no tenderness. There is no rebound and no guarding.  Genitourinary:  Deferred with  shared decision making  Musculoskeletal: He exhibits tenderness. He exhibits no edema.  Antalgic gait, surgical wound on low back healing well  Lymphadenopathy:    He has no cervical adenopathy.  Neurological: He is alert and oriented to person, place, and time. He has normal reflexes. He displays normal reflexes. No cranial nerve deficit. He exhibits normal muscle tone. Coordination normal.  Skin: Skin is warm, dry and intact. No rash noted. He is not diaphoretic. No erythema. No pallor.  Psychiatric: He has a normal mood and affect. His speech is normal and behavior is normal. Judgment and thought content normal. Cognition and memory are normal.  Nursing note and vitals reviewed.   Results for orders placed or performed in visit on 08/23/15  HM DIABETES EYE EXAM  Result Value Ref Range   HM Diabetic Eye Exam No Retinopathy No Retinopathy  HM DIABETES EYE EXAM  Result Value Ref Range   HM Diabetic Eye Exam No Retinopathy No Retinopathy      Assessment & Plan:   Problem List Items Addressed This Visit      Cardiovascular and Mediastinum   Essential hypertension    Under good control. Continue current regimen. Continue to monitor. Call with any concerns.       Relevant Orders   CBC with Differential/Platelet   Comprehensive metabolic panel   Microalbumin, Urine Waived   UA/M w/rflx Culture, Routine     Endocrine   Type 2 diabetes mellitus with hyperglycemia, without long-term current use of insulin  ([place])    Due for recheck on labs in August. Continue to monitor.       Relevant Orders   CBC with Differential/Platelet   Comprehensive metabolic panel   Microalbumin, Urine Waived   UA/M w/rflx Culture, Routine     Other   HLD (hyperlipidemia)    Under good control. Continue current regimen. Continue to monitor. Call with any concerns.       Relevant Orders   Comprehensive metabolic panel   Lipid Panel w/o Chol/HDL Ratio   Insomnia    Under good control on current regimen. Continue to monitor. Recheck  62months.       Relevant Orders   CBC with Differential/Platelet   Comprehensive metabolic panel   Chronic pain    Getting medicine from orthopedics following surgery. Unclear if they are continuing this or continuing to see him. Will check with them. May need to see pain center.        Other Visit Diagnoses    Routine general medical examination at a health care facility    -  Primary    Up to date on vaccines, will check on zoster. Screening labs checked today. Work on diet and exercise. Call with any concerns.     Screening for prostate cancer        Labs checked today. Await results.     Relevant Orders    PSA        Discussed aspirin prophylaxis for myocardial infarction prevention and decision was made to continue ASA  LABORATORY TESTING:  Health maintenance labs ordered today as discussed above.   The natural history of prostate cancer and ongoing controversy regarding screening and potential treatment outcomes of prostate cancer has been discussed with the patient. The meaning of a false positive PSA and a false negative PSA has been discussed. He indicates understanding of the limitations of this screening test and wishes to proceed with screening PSA testing.   IMMUNIZATIONS:   - Tdap:  Tetanus vaccination status reviewed: last tetanus booster within 10 years. - Influenza: Up to date - Pneumovax: Up to date - Prevnar: Not applicable - Zostavax vaccine: will  check with his insurance  SCREENING: - Colonoscopy: Up to date  Discussed with patient purpose of the colonoscopy is to detect colon cancer at curable precancerous or early stages   PATIENT COUNSELING:    Sexuality: Discussed sexually transmitted diseases, partner selection, use of condoms, avoidance of unintended pregnancy  and contraceptive alternatives.   Advised to avoid cigarette smoking.  I discussed with the patient that most people either abstain from alcohol or drink within safe limits (<=14/week and <=4 drinks/occasion for males, <=7/weeks and <= 3 drinks/occasion for females) and that the risk for alcohol disorders and other health effects rises proportionally with the number of drinks per week and how often a drinker exceeds daily limits.  Discussed cessation/primary prevention of drug use and availability of treatment for abuse.   Diet: Encouraged to adjust caloric intake to maintain  or achieve ideal body weight, to reduce intake of dietary saturated fat and total fat, to limit sodium intake by avoiding high sodium foods and not adding table salt, and to maintain adequate dietary potassium and calcium preferably from fresh fruits, vegetables, and low-fat dairy products.    stressed the importance of regular exercise  Injury prevention: Discussed safety belts, safety helmets, smoke detector, smoking near bedding or upholstery.   Dental health: Discussed importance of regular tooth brushing, flossing, and dental visits.   Follow up plan: NEXT PREVENTATIVE PHYSICAL DUE IN 1 YEAR. Return August, for Follow up.

## 2015-10-04 NOTE — Patient Instructions (Addendum)
Health Maintenance, Male A healthy lifestyle and preventative care can promote health and wellness.  Maintain regular health, dental, and eye exams.  Eat a healthy diet. Foods like vegetables, fruits, whole grains, low-fat dairy products, and lean protein foods contain the nutrients you need and are low in calories. Decrease your intake of foods high in solid fats, added sugars, and salt. Get information about a proper diet from your health care provider, if necessary.  Regular physical exercise is one of the most important things you can do for your health. Most adults should get at least 150 minutes of moderate-intensity exercise (any activity that increases your heart rate and causes you to sweat) each week. In addition, most adults need muscle-strengthening exercises on 2 or more days a week.   Maintain a healthy weight. The body mass index (BMI) is a screening tool to identify possible weight problems. It provides an estimate of body fat based on height and weight. Your health care provider can find your BMI and can help you achieve or maintain a healthy weight. For males 20 years and older:  A BMI below 18.5 is considered underweight.  A BMI of 18.5 to 24.9 is normal.  A BMI of 25 to 29.9 is considered overweight.  A BMI of 30 and above is considered obese.  Maintain normal blood lipids and cholesterol by exercising and minimizing your intake of saturated fat. Eat a balanced diet with plenty of fruits and vegetables. Blood tests for lipids and cholesterol should begin at age 20 and be repeated every 5 years. If your lipid or cholesterol levels are high, you are over age 50, or you are at high risk for heart disease, you may need your cholesterol levels checked more frequently.Ongoing high lipid and cholesterol levels should be treated with medicines if diet and exercise are not working.  If you smoke, find out from your health care provider how to quit. If you do not use tobacco, do not  start.  Lung cancer screening is recommended for adults aged 55-80 years who are at high risk for developing lung cancer because of a history of smoking. A yearly low-dose CT scan of the lungs is recommended for people who have at least a 30-pack-year history of smoking and are current smokers or have quit within the past 15 years. A pack year of smoking is smoking an average of 1 pack of cigarettes a day for 1 year (for example, a 30-pack-year history of smoking could mean smoking 1 pack a day for 30 years or 2 packs a day for 15 years). Yearly screening should continue until the smoker has stopped smoking for at least 15 years. Yearly screening should be stopped for people who develop a health problem that would prevent them from having lung cancer treatment.  If you choose to drink alcohol, do not have more than 2 drinks per day. One drink is considered to be 12 oz (360 mL) of beer, 5 oz (150 mL) of wine, or 1.5 oz (45 mL) of liquor.  Avoid the use of street drugs. Do not share needles with anyone. Ask for help if you need support or instructions about stopping the use of drugs.  High blood pressure causes heart disease and increases the risk of stroke. High blood pressure is more likely to develop in:  People who have blood pressure in the end of the normal range (100-139/85-89 mm Hg).  People who are overweight or obese.  People who are African American.    If you are 18-39 years of age, have your blood pressure checked every 3-5 years. If you are 40 years of age or older, have your blood pressure checked every year. You should have your blood pressure measured twice--once when you are at a hospital or clinic, and once when you are not at a hospital or clinic. Record the average of the two measurements. To check your blood pressure when you are not at a hospital or clinic, you can use:  An automated blood pressure machine at a pharmacy.  A home blood pressure monitor.  If you are 45-79 years  old, ask your health care provider if you should take aspirin to prevent heart disease.  Diabetes screening involves taking a blood sample to check your fasting blood sugar level. This should be done once every 3 years after age 45 if you are at a normal weight and without risk factors for diabetes. Testing should be considered at a younger age or be carried out more frequently if you are overweight and have at least 1 risk factor for diabetes.  Colorectal cancer can be detected and often prevented. Most routine colorectal cancer screening begins at the age of 50 and continues through age 75. However, your health care provider may recommend screening at an earlier age if you have risk factors for colon cancer. On a yearly basis, your health care provider may provide home test kits to check for hidden blood in the stool. A small camera at the end of a tube may be used to directly examine the colon (sigmoidoscopy or colonoscopy) to detect the earliest forms of colorectal cancer. Talk to your health care provider about this at age 50 when routine screening begins. A direct exam of the colon should be repeated every 5-10 years through age 75, unless early forms of precancerous polyps or small growths are found.  People who are at an increased risk for hepatitis B should be screened for this virus. You are considered at high risk for hepatitis B if:  You were born in a country where hepatitis B occurs often. Talk with your health care provider about which countries are considered high risk.  Your parents were born in a high-risk country and you have not received a shot to protect against hepatitis B (hepatitis B vaccine).  You have HIV or AIDS.  You use needles to inject street drugs.  You live with, or have sex with, someone who has hepatitis B.  You are a man who has sex with other men (MSM).  You get hemodialysis treatment.  You take certain medicines for conditions like cancer, organ  transplantation, and autoimmune conditions.  Hepatitis C blood testing is recommended for all people born from 1945 through 1965 and any individual with known risk factors for hepatitis C.  Healthy men should no longer receive prostate-specific antigen (PSA) blood tests as part of routine cancer screening. Talk to your health care provider about prostate cancer screening.  Testicular cancer screening is not recommended for adolescents or adult males who have no symptoms. Screening includes self-exam, a health care provider exam, and other screening tests. Consult with your health care provider about any symptoms you have or any concerns you have about testicular cancer.  Practice safe sex. Use condoms and avoid high-risk sexual practices to reduce the spread of sexually transmitted infections (STIs).  You should be screened for STIs, including gonorrhea and chlamydia if:  You are sexually active and are younger than 24 years.  You   are older than 24 years, and your health care provider tells you that you are at risk for this type of infection.  Your sexual activity has changed since you were last screened, and you are at an increased risk for chlamydia or gonorrhea. Ask your health care provider if you are at risk.  If you are at risk of being infected with HIV, it is recommended that you take a prescription medicine daily to prevent HIV infection. This is called pre-exposure prophylaxis (PrEP). You are considered at risk if:  You are a man who has sex with other men (MSM).  You are a heterosexual man who is sexually active with multiple partners.  You take drugs by injection.  You are sexually active with a partner who has HIV.  Talk with your health care provider about whether you are at high risk of being infected with HIV. If you choose to begin PrEP, you should first be tested for HIV. You should then be tested every 3 months for as long as you are taking PrEP.  Use sunscreen. Apply  sunscreen liberally and repeatedly throughout the day. You should seek shade when your shadow is shorter than you. Protect yourself by wearing long sleeves, pants, a wide-brimmed hat, and sunglasses year round whenever you are outdoors.  Tell your health care provider of new moles or changes in moles, especially if there is a change in shape or color. Also, tell your health care provider if a mole is larger than the size of a pencil eraser.  A one-time screening for abdominal aortic aneurysm (AAA) and surgical repair of large AAAs by ultrasound is recommended for men aged 32-75 years who are current or former smokers.  Stay current with your vaccines (immunizations).   This information is not intended to replace advice given to you by your health care provider. Make sure you discuss any questions you have with your health care provider.   Document Released: 12/02/2007 Document Revised: 06/26/2014 Document Reviewed: 10/31/2010 Elsevier Interactive Patient Education 2016 [place] Virus Vaccine Live injection What is this medicine? VARICELLA VIRUS VACCINE (var uh SEL uh VAHY ruhs vak SEEN) is used to prevent infections of chickenpox. HERPES ZOSTER VIRUS VACCINE (HUR peez ZOS ter vahy ruhs vak SEEN) is used to prevent shingles in adults 53 years old and over. This vaccine is not used to treat shingles or nerve pain from shingles. These medicines may be used for other purposes; ask your health care provider or pharmacist if you have questions. This medicine may be used for other purposes; ask your health care provider or pharmacist if you have questions. What should I tell my health care provider before I take this medicine? They need to know if you have any of the following conditions: -blood disorders or disease -cancer like leukemia or lymphoma -immune system problems or therapy -infection with fever -recent immune globulin therapy -tuberculosis -an unusual or allergic  reaction to vaccines, neomycin, gelatin, other medicines, foods, dyes, or preservatives -pregnant or trying to get pregnant -breast-feeding How should I use this medicine? These vaccines are for injection under the skin. They are given by a health care professional. A copy of Vaccine Information Statements will be given before each varicella virus vaccination. Read this sheet carefully each time. The sheet may change frequently. A Vaccine Information Statement is not given before the herpes zoster virus vaccine. Talk to your pediatrician regarding the use of the varicella virus vaccine in children. While this drug may be  prescribed for children as young as 67 months of age for selected conditions, precautions do apply. The herpes zoster virus vaccine is not approved in children. Overdosage: If you think you have taken too much of this medicine contact a poison control center or emergency room at once. NOTE: This medicine is only for you. Do not share this medicine with others. What if I miss a dose? Keep appointments for follow-up (booster) doses of varicella virus vaccine as directed. It is important not to miss your dose. Call your doctor or health care professional if you are unable to keep an appointment. Follow-up (booster) doses are not needed for the herpes zoster virus vaccine. What may interact with this medicine? Do not take these medicines with any of the following medications: -adalimumab -anakinra -etanercept -infliximab -medicines that suppress your immune system -medicines to treat cancer These medicines may also interact with the following medications: -aspirin and aspirin-like medicines (varicella virus vaccine only) -blood transfusions (varicella virus vaccine only) -immunoglobulins (varicella virus vaccine only) -steroid medicines like prednisone or cortisone This list may not describe all possible interactions. Give your health care provider a list of all the medicines,  herbs, non-prescription drugs, or dietary supplements you use. Also tell them if you smoke, drink alcohol, or use illegal drugs. Some items may interact with your medicine. What should I watch for while using this medicine? Visit your doctor for regular check ups. These vaccines, like all vaccines, may not fully protect everyone. After receiving these vaccines it may be possible to pass chickenpox infection to others. For up to 6 weeks, avoid people with immune system problems, pregnant women who have not had chickenpox, newborns of women who have not had chickenpox, and all newborns born at less than 28 weeks of pregnancy. Talk to your doctor for more information. Do not become pregnant for 3 months after taking these vaccines. Women should inform their doctor if they wish to become pregnant or think they might be pregnant. There is a potential for serious side effects to an unborn child. Talk to your health care professional or pharmacist for more information. What side effects may I notice from receiving this medicine? Side effects that you should report to your doctor or health care professional as soon as possible: -allergic reactions like skin rash, itching or hives, swelling of the face, lips, or tongue -breathing problems -extreme changes in behavior -feeling faint or lightheaded, falls -fever over 102 degrees F -pain, tingling, numbness in the hands or feet -redness, blistering, peeling or loosening of the skin, including inside the mouth -seizures -unusually weak or tired Side effects that usually do not require medical attention (report to your doctor or health care professional if they continue or are bothersome): -aches or pains -chickenpox-like rash -diarrhea -headache -low-grade fever under 102 degrees F -loss of appetite -nausea, vomiting -redness, pain, swelling at site where injected -sleepy -trouble sleeping This list may not describe all possible side effects. Call  your doctor for medical advice about side effects. You may report side effects to FDA at 1-800-FDA-1088. Where should I keep my medicine? These drugs are given in a hospital or clinic and will not be stored at home. NOTE: This sheet is a summary. It may not cover all possible information. If you have questions about this medicine, talk to your doctor, pharmacist, or health care provider.    2016, Elsevier/Gold Standard. (2013-02-07 14:24:35) You Can Quit Smoking If you are ready to quit smoking or are thinking about it, congratulations!  You have chosen to help yourself be healthier and live longer! There are lots of different ways to quit smoking. Nicotine gum, nicotine patches, a nicotine inhaler, or nicotine nasal spray can help with physical craving. Hypnosis, support groups, and medicines help break the habit of smoking. TIPS TO GET OFF AND STAY OFF CIGARETTES  Learn to predict your moods. Do not let a bad situation be your excuse to have a cigarette. Some situations in your life might tempt you to have a cigarette.  Ask friends and co-workers not to smoke around you.  Make your home smoke-free.  Never have "just one" cigarette. It leads to wanting another and another. Remind yourself of your decision to quit.  On a card, make a list of your reasons for not smoking. Read it at least the same number of times a day as you have a cigarette. Tell yourself everyday, "I do not want to smoke. I choose not to smoke."  Ask someone at home or work to help you with your plan to quit smoking.  Have something planned after you eat or have a cup of coffee. Take a walk or get other exercise to perk you up. This will help to keep you from overeating.  Try a relaxation exercise to calm you down and decrease your stress. Remember, you may be tense and nervous the first two weeks after you quit. This will pass.  Find new activities to keep your hands busy. Play with a pen, coin, or rubber band. Doodle or  draw things on paper.  Brush your teeth right after eating. This will help cut down the craving for the taste of tobacco after meals. You can try mouthwash too.  Try gum, breath mints, or diet candy to keep something in your mouth. IF YOU SMOKE AND WANT TO QUIT:  Do not stock up on cigarettes. Never buy a carton. Wait until one pack is finished before you buy another.  Never carry cigarettes with you at work or at home.  Keep cigarettes as far away from you as possible. Leave them with someone else.  Never carry matches or a lighter with you.  Ask yourself, "Do I need this cigarette or is this just a reflex?"  Bet with someone that you can quit. Put cigarette money in a piggy bank every morning. If you smoke, you give up the money. If you do not smoke, by the end of the week, you keep the money.  Keep trying. It takes 21 days to change a habit!  Talk to your doctor about using medicines to help you quit. These include nicotine replacement gum, lozenges, or skin patches.   This information is not intended to replace advice given to you by your health care provider. Make sure you discuss any questions you have with your health care provider.   Document Released: 04/01/2009 Document Revised: 08/28/2011 Document Reviewed: 04/01/2009 Elsevier Interactive Patient Education [employer].

## 2015-10-04 NOTE — Assessment & Plan Note (Signed)
Due for recheck on labs in August. Continue to monitor.

## 2015-10-04 NOTE — Assessment & Plan Note (Signed)
Under good control. Continue current regimen. Continue to monitor. Call with any concerns. 

## 2015-10-04 NOTE — Assessment & Plan Note (Signed)
Getting medicine from orthopedics following surgery. Unclear if they are continuing this or continuing to see him. Will check with them. May need to see pain center.

## 2015-10-05 ENCOUNTER — Telehealth: Payer: Self-pay | Admitting: Family Medicine

## 2015-10-05 ENCOUNTER — Encounter: Payer: Self-pay | Admitting: Family Medicine

## 2015-10-05 DIAGNOSIS — D72829 Elevated white blood cell count, unspecified: Secondary | ICD-10-CM

## 2015-10-05 LAB — CBC WITH DIFFERENTIAL/PLATELET
BASOS: 0 %
Basophils Absolute: 0 10*3/uL (ref 0.0–0.2)
EOS (ABSOLUTE): 0.3 10*3/uL (ref 0.0–0.4)
EOS: 2 %
HEMATOCRIT: 42.6 % (ref 37.5–51.0)
HEMOGLOBIN: 14.4 g/dL (ref 12.6–17.7)
IMMATURE GRANULOCYTES: 1 %
Immature Grans (Abs): 0.1 10*3/uL (ref 0.0–0.1)
LYMPHS ABS: 3.8 10*3/uL — AB (ref 0.7–3.1)
Lymphs: 30 %
MCH: 29.4 pg (ref 26.6–33.0)
MCHC: 33.8 g/dL (ref 31.5–35.7)
MCV: 87 fL (ref 79–97)
MONOCYTES: 6 %
MONOS ABS: 0.8 10*3/uL (ref 0.1–0.9)
NEUTROS PCT: 61 %
Neutrophils Absolute: 8 10*3/uL — ABNORMAL HIGH (ref 1.4–7.0)
Platelets: 424 10*3/uL — ABNORMAL HIGH (ref 150–379)
RBC: 4.89 x10E6/uL (ref 4.14–5.80)
RDW: 13.3 % (ref 12.3–15.4)
WBC: 12.9 10*3/uL — ABNORMAL HIGH (ref 3.4–10.8)

## 2015-10-05 LAB — COMPREHENSIVE METABOLIC PANEL
A/G RATIO: 2 (ref 1.2–2.2)
ALBUMIN: 4.5 g/dL (ref 3.5–5.5)
ALT: 7 IU/L (ref 0–44)
AST: 11 IU/L (ref 0–40)
Alkaline Phosphatase: 79 IU/L (ref 39–117)
BUN / CREAT RATIO: 12 (ref 9–20)
BUN: 18 mg/dL (ref 6–24)
Bilirubin Total: 0.3 mg/dL (ref 0.0–1.2)
CALCIUM: 9.6 mg/dL (ref 8.7–10.2)
CO2: 21 mmol/L (ref 18–29)
CREATININE: 1.47 mg/dL — AB (ref 0.76–1.27)
Chloride: 98 mmol/L (ref 96–106)
GFR, EST AFRICAN AMERICAN: 63 mL/min/{1.73_m2} (ref >59)
GFR, EST NON AFRICAN AMERICAN: 54 mL/min/{1.73_m2} — AB (ref >59)
GLOBULIN, TOTAL: 2.3 g/dL (ref 1.5–4.5)
Glucose: 104 mg/dL — ABNORMAL HIGH (ref 65–99)
POTASSIUM: 5.4 mmol/L — AB (ref 3.5–5.2)
SODIUM: 141 mmol/L (ref 134–144)
Total Protein: 6.8 g/dL (ref 6.0–8.5)

## 2015-10-05 LAB — LIPID PANEL W/O CHOL/HDL RATIO
Cholesterol, Total: 169 mg/dL (ref 100–199)
HDL: 35 mg/dL — ABNORMAL LOW (ref >39)
LDL CALC: 80 mg/dL (ref 0–99)
TRIGLYCERIDES: 268 mg/dL — AB (ref 0–149)
VLDL Cholesterol Cal: 54 mg/dL — ABNORMAL HIGH (ref 5–40)

## 2015-10-05 LAB — PSA: PROSTATE SPECIFIC AG, SERUM: 1.9 ng/mL (ref 0.0–4.0)

## 2015-10-05 MED ORDER — HYDROCODONE-ACETAMINOPHEN 10-325 MG PO TABS: 1.0000 | ORAL_TABLET | Freq: Four times a day (QID) | ORAL | Status: DC | PRN

## 2015-10-05 NOTE — Telephone Encounter (Addendum)
Please let Rickey Glover know that his labs look nice and normal except his white blood cells are elevated. This is probably from his surgery, but we want to make sure it goes back down, so we'll have to recheck in about a month just for a lab visit. Otherwise everything looked good and I'll send him a copy of them

## 2015-10-05 NOTE — Telephone Encounter (Signed)
Patient notified, patient scheduled for follow up lab appointment

## 2015-10-05 NOTE — Telephone Encounter (Signed)
Orthopedics is not planning on continuing his pain medicine. Had lumbar surgery done. Not having hip done just yet. Would like to continue his medication for now. PMP reviewed 110 pills of oxycodone 5mg  given this month. Last Rx for 60 pills prescribed on 09/29/15. OK to get back on his usual schedule with the hydrocodone. Rx written for him to be able to pick up with his clonazepam on 10/15/15. As pain is not getting significantly better following surgery, will discuss Pain management next visit.

## 2015-10-08 ENCOUNTER — Other Ambulatory Visit: Payer: Self-pay | Admitting: Family Medicine

## 2015-10-08 ENCOUNTER — Ambulatory Visit (INDEPENDENT_AMBULATORY_CARE_PROVIDER_SITE_OTHER): Payer: Commercial Managed Care - HMO | Admitting: Family Medicine

## 2015-10-08 ENCOUNTER — Encounter: Payer: Self-pay | Admitting: Family Medicine

## 2015-10-08 VITALS — BP 98/65 | HR 79 | Temp 97.7°F | Ht 72.5 in | Wt 288.0 lb

## 2015-10-08 DIAGNOSIS — L609 Nail disorder, unspecified: Secondary | ICD-10-CM | POA: Diagnosis not present

## 2015-10-08 DIAGNOSIS — M25561 Pain in right knee: Secondary | ICD-10-CM | POA: Diagnosis not present

## 2015-10-08 DIAGNOSIS — G8929 Other chronic pain: Secondary | ICD-10-CM

## 2015-10-08 DIAGNOSIS — L602 Onychogryphosis: Secondary | ICD-10-CM

## 2015-10-08 DIAGNOSIS — I951 Orthostatic hypotension: Secondary | ICD-10-CM | POA: Diagnosis not present

## 2015-10-08 DIAGNOSIS — M25551 Pain in right hip: Secondary | ICD-10-CM | POA: Insufficient documentation

## 2015-10-08 MED ORDER — GABAPENTIN 100 MG PO CAPS: 100.0000 mg | ORAL_CAPSULE | Freq: Three times a day (TID) | ORAL | Status: DC

## 2015-10-08 NOTE — Progress Notes (Signed)
BP 98/65 mmHg  Pulse 79  Temp(Src) 97.7 F (36.5 C)  Ht 6' 0.5" (1.842 m)  Wt 288 lb (130.636 kg)  BMI 38.50 kg/m2  SpO2 97%   Subjective:    Patient ID: Rickey Glover, male    DOB: [birth year], 55 y.o.   MRN: [id number]  HPI: Rickey Glover is a 55 y.o. male  Chief Complaint  Patient presents with  . Knee Pain    patient would like an steriod injection in his knee  . Hypertension    patients blood pressure is on the low side of normal   Having a lot of pain in his R knee. Has been hurting ever since he had his surgery. He states that his R hip continues to bother him quite a bit and has gotten worse since his surgery. His low back is better. No more numbness or tingling in his legs. But now he is unable to put his R leg on his L knee and feels like his pain is getting worse. Not doing well with the pain medicine. States that's it's not even working any more. Not sure what to do. Very frustrated.   Having his L toenail growing over his middle toe. Very painful and uncomfortable. Going May 9th for his foot appointment. Would like to know if he can get in sooner.   Not feeling dizzy. Feeling normal. No palpitations. No SOB. Does note that he has been tired.   CHRONIC PAIN  Present dose: 40  Morphine equivalents Pain control status: exacerbated Duration: chronic Location: low back, R hip, bilateral knees Quality: dull, aching, sore and stabbing Current Pain Level: moderate Previous Pain Level: moderate Breakthrough pain: yes Benefit from narcotic medications: Unsure What Activities task can be accomplished with current medication?: Able to take care of self and trying to go back to work Interested in weaning off narcotics:no   Stool softners/OTC fiber: no  Previous pain specialty evaluation: no Non-narcotic analgesic meds: yes- has been on gabapentin, only taking 400mg  QAM, doesn't like it, make shim sleepy. Wants to try coming off of it. Will wean him off Narcotic  contract: yes   Relevant past medical, surgical, family and social history reviewed and updated as indicated. Interim medical history since our last visit reviewed. Allergies and medications reviewed and updated.  Review of Systems  Constitutional: Negative.   Respiratory: Negative.   Cardiovascular: Negative.   Musculoskeletal: Positive for myalgias, back pain, joint swelling, arthralgias and gait problem. Negative for neck pain and neck stiffness.  Skin: Negative.   Neurological: Negative.   Psychiatric/Behavioral: Negative.    Per HPI unless specifically indicated above     Objective:    BP 98/65 mmHg  Pulse 79  Temp(Src) 97.7 F (36.5 C)  Ht 6' 0.5" (1.842 m)  Wt 288 lb (130.636 kg)  BMI 38.50 kg/m2  SpO2 97%  Wt Readings from Last 3 Encounters:  10/08/15 288 lb (130.636 kg)  10/04/15 283 lb (128.368 kg)  09/14/15 291 lb (131.997 kg)    Orthostatic VS for the past 24 hrs (Last 3 readings):  BP- Lying Pulse- Lying BP- Sitting Pulse- Sitting BP- Standing at 0 minutes Pulse- Standing at 0 minutes BP- Standing at 3 minutes Pulse- Standing at 3 minutes  10/08/15 1013 (!) 84/53 mmHg 60 (!) 74/43 mmHg 67 (!) 81/52 mmHg 70 (!) 83/52 mmHg 70    Physical Exam  Constitutional: He is oriented to person, place, and time. He appears well-developed and well-nourished. No distress.  HENT:  Head: Normocephalic and atraumatic.  Right Ear: Hearing normal.  Left Ear: Hearing normal.  Nose: Nose normal.  Eyes: Conjunctivae and lids are normal. Right eye exhibits no discharge. Left eye exhibits no discharge. No scleral icterus.  Cardiovascular: Normal rate, regular rhythm, normal heart sounds and intact distal pulses.  Exam reveals no gallop and no friction rub.   No murmur heard. Pulmonary/Chest: Effort normal and breath sounds normal. No respiratory distress. He has no wheezes. He has no rales. He exhibits no tenderness.  Musculoskeletal: He exhibits tenderness. He exhibits no edema.   Neurological: He is alert and oriented to person, place, and time.  Skin: Skin is warm, dry and intact. No rash noted. No erythema. No pallor.  Psychiatric: He has a normal mood and affect. His speech is normal and behavior is normal. Judgment and thought content normal. Cognition and memory are normal.  Nursing note and vitals reviewed. Hip Exam: Right    Tenderness to palpation:      Greater trochanter: no      Anterior superior iliac spine: no     Anterior hip: yes     Iliac crest: yes     Iliac tubercle: yes     Pubic tubercle: no     SI joint: yes      Range of Motion:     Flexion: Decreased    Extension: Decreased    Abduction: Decreased    Adduction: Decreased    Internal rotation: Decreased    External rotation: Decreased     Muscle Strength:  5/5 bilaterally     Special Tests:    Ober test: positive    FABER test:positive   Results for orders placed or performed in visit on 10/04/15  CBC with Differential/Platelet  Result Value Ref Range   WBC 12.9 (H) 3.4 - 10.8 x10E3/uL   RBC 4.89 4.14 - 5.80 x10E6/uL   Hemoglobin 14.4 12.6 - 17.7 g/dL   Hematocrit 42.6 37.5 - 51.0 %   MCV 87 79 - 97 fL   MCH 29.4 26.6 - 33.0 pg   MCHC 33.8 31.5 - 35.7 g/dL   RDW 13.3 12.3 - 15.4 %   Platelets 424 (H) 150 - 379 x10E3/uL   Neutrophils 61 %   Lymphs 30 %   Monocytes 6 %   Eos 2 %   Basos 0 %   Neutrophils Absolute 8.0 (H) 1.4 - 7.0 x10E3/uL   Lymphocytes Absolute 3.8 (H) 0.7 - 3.1 x10E3/uL   Monocytes Absolute 0.8 0.1 - 0.9 x10E3/uL   EOS (ABSOLUTE) 0.3 0.0 - 0.4 x10E3/uL   Basophils Absolute 0.0 0.0 - 0.2 x10E3/uL   Immature Granulocytes 1 %   Immature Grans (Abs) 0.1 0.0 - 0.1 x10E3/uL  Comprehensive metabolic panel  Result Value Ref Range   Glucose 104 (H) 65 - 99 mg/dL   BUN 18 6 - 24 mg/dL   Creatinine, Ser 1.47 (H) 0.76 - 1.27 mg/dL   GFR calc non Af Amer 54 (L) >59 mL/min/1.73   GFR calc Af Amer 63 >59 mL/min/1.73   BUN/Creatinine Ratio 12 9 - 20   Sodium  141 134 - 144 mmol/L   Potassium 5.4 (H) 3.5 - 5.2 mmol/L   Chloride 98 96 - 106 mmol/L   CO2 21 18 - 29 mmol/L   Calcium 9.6 8.7 - 10.2 mg/dL   Total Protein 6.8 6.0 - 8.5 g/dL   Albumin 4.5 3.5 - 5.5 g/dL   Globulin, Total 2.3 1.5 - 4.5  g/dL   Albumin/Globulin Ratio 2.0 1.2 - 2.2   Bilirubin Total 0.3 0.0 - 1.2 mg/dL   Alkaline Phosphatase 79 39 - 117 IU/L   AST 11 0 - 40 IU/L   ALT 7 0 - 44 IU/L  Lipid Panel w/o Chol/HDL Ratio  Result Value Ref Range   Cholesterol, Total 169 100 - 199 mg/dL   Triglycerides 268 (H) 0 - 149 mg/dL   HDL 35 (L) >39 mg/dL   VLDL Cholesterol Cal 54 (H) 5 - 40 mg/dL   LDL Calculated 80 0 - 99 mg/dL  Microalbumin, Urine Waived  Result Value Ref Range   Microalb, Ur Waived 10 0 - 19 mg/L   Creatinine, Urine Waived 200 10 - 300 mg/dL   Microalb/Creat Ratio <30 <30 mg/g  PSA  Result Value Ref Range   Prostate Specific Ag, Serum 1.9 0.0 - 4.0 ng/mL  UA/M w/rflx Culture, Routine  Result Value Ref Range   Specific Gravity, UA 1.020 1.005 - 1.030   pH, UA 5.0 5.0 - 7.5   Color, UA Yellow Yellow   Appearance Ur Clear Clear   Leukocytes, UA Negative Negative   Protein, UA Negative Negative/Trace   Glucose, UA Negative Negative   Ketones, UA Negative Negative   RBC, UA Negative Negative   Bilirubin, UA Negative Negative   Urobilinogen, Ur 0.2 0.2 - 1.0 mg/dL   Nitrite, UA Negative Negative      Assessment & Plan:   Problem List Items Addressed This Visit      Cardiovascular and Mediastinum   Orthostatic hypotension    Will stop his spironalactone and recheck in 2 weeks.         Musculoskeletal and Integument   Hypertrophic toenail - Primary    Appointment scheduled with [facility]. On cancellation list. Soak foot in epsom salts. Call with concerns.         Other   Right knee pain    To see orthopedics. Exercises given. Call with any concerns.       Chronic pain    Discussed that pain is continuing longer than short term. Will  get him into Pain management. Referral made today. Await appointment. Having excessive fatigue with gabapentin. Will try him off it. Wean off as instructed.      Relevant Medications   gabapentin (NEURONTIN) 100 MG capsule   Other Relevant Orders   Ambulatory referral to Pain Clinic   Right hip pain    To see orthopedics. Exercises given. Call with any concerns. Needs hip replacement.          Follow up plan: Return in about 2 weeks (around 10/22/2015) for recheck blood pressure.

## 2015-10-08 NOTE — Assessment & Plan Note (Signed)
To see orthopedics. Exercises given. Call with any concerns.

## 2015-10-08 NOTE — Assessment & Plan Note (Addendum)
Will stop his spironalactone and recheck in 2 weeks.

## 2015-10-08 NOTE — Assessment & Plan Note (Addendum)
Discussed that pain is continuing longer than short term. Will get him into Pain management. Referral made today. Await appointment. Having excessive fatigue with gabapentin. Will try him off it. Wean off as instructed.

## 2015-10-08 NOTE — Patient Instructions (Signed)
Generic Knee Exercises EXERCISES RANGE OF MOTION (ROM) AND STRETCHING EXERCISES These exercises may help you when beginning to rehabilitate your injury. Your symptoms may resolve with or without further involvement from your physician, physical therapist, or athletic trainer. While completing these exercises, remember:   Restoring tissue flexibility helps normal motion to return to the joints. This allows healthier, less painful movement and activity.  An effective stretch should be held for at least 30 seconds.  A stretch should never be painful. You should only feel a gentle lengthening or release in the stretched tissue. STRETCH - Knee Extension, Prone  Lie on your stomach on a firm surface, such as a bed or countertop. Place your right / left knee and leg just beyond the edge of the surface. You may wish to place a towel under the far end of your right / left thigh for comfort.  Relax your leg muscles and allow gravity to straighten your knee. Your clinician may advise you to add an ankle weight if more resistance is helpful for you.  You should feel a stretch in the back of your right / left knee. Hold this position for __________ seconds. Repeat __________ times. Complete this stretch __________ times per day. * Your physician, physical therapist, or athletic trainer may ask you to add ankle weight to enhance your stretch.  RANGE OF MOTION - Knee Flexion, Active  Lie on your back with both knees straight. (If this causes back discomfort, bend your opposite knee, placing your foot flat on the floor.)  Slowly slide your heel back toward your buttocks until you feel a gentle stretch in the front of your knee or thigh.  Hold for __________ seconds. Slowly slide your heel back to the starting position. Repeat __________ times. Complete this exercise __________ times per day.  STRETCH - Quadriceps, Prone   Lie on your stomach on a firm surface, such as a bed or padded floor.  Bend your  right / left knee and grasp your ankle. If you are unable to reach your ankle or pant leg, use a belt around your foot to lengthen your reach.  Gently pull your heel toward your buttocks. Your knee should not slide out to the side. You should feel a stretch in the front of your thigh and/or knee.  Hold this position for __________ seconds. Repeat __________ times. Complete this stretch __________ times per day.  STRETCH - Hamstrings, Supine   Lie on your back. Loop a belt or towel over the ball of your right / left foot.  Straighten your right / left knee and slowly pull on the belt to raise your leg. Do not allow the right / left knee to bend. Keep your opposite leg flat on the floor.  Raise the leg until you feel a gentle stretch behind your right / left knee or thigh. Hold this position for __________ seconds. Repeat __________ times. Complete this stretch __________ times per day.  STRENGTHENING EXERCISES These exercises may help you when beginning to rehabilitate your injury. They may resolve your symptoms with or without further involvement from your physician, physical therapist, or athletic trainer. While completing these exercises, remember:   Muscles can gain both the endurance and the strength needed for everyday activities through controlled exercises.  Complete these exercises as instructed by your physician, physical therapist, or athletic trainer. Progress the resistance and repetitions only as guided.  You may experience muscle soreness or fatigue, but the pain or discomfort you are trying to   eliminate should never worsen during these exercises. If this pain does worsen, stop and make certain you are following the directions exactly. If the pain is still present after adjustments, discontinue the exercise until you can discuss the trouble with your clinician. STRENGTH - Quadriceps, Isometrics  Lie on your back with your right / left leg extended and your opposite knee  bent.  Gradually tense the muscles in the front of your right / left thigh. You should see either your knee cap slide up toward your hip or increased dimpling just above the knee. This motion will push the back of the knee down toward the floor/mat/bed on which you are lying.  Hold the muscle as tight as you can without increasing your pain for __________ seconds.  Relax the muscles slowly and completely in between each repetition. Repeat __________ times. Complete this exercise __________ times per day.  STRENGTH - Quadriceps, Short Arcs   Lie on your back. Place a __________ inch towel roll under your knee so that the knee slightly bends.  Raise only your lower leg by tightening the muscles in the front of your thigh. Do not allow your thigh to rise.  Hold this position for __________ seconds. Repeat __________ times. Complete this exercise __________ times per day.  OPTIONAL ANKLE WEIGHTS: Begin with ____________________, but DO NOT exceed ____________________. Increase in 1 pound/0.5 kilogram increments.  STRENGTH - Quadriceps, Straight Leg Raises  Quality counts! Watch for signs that the quadriceps muscle is working to insure you are strengthening the correct muscles and not "cheating" by substituting with healthier muscles.  Lay on your back with your right / left leg extended and your opposite knee bent.  Tense the muscles in the front of your right / left thigh. You should see either your knee cap slide up or increased dimpling just above the knee. Your thigh may even quiver.  Tighten these muscles even more and raise your leg 4 to 6 inches off the floor. Hold for __________ seconds.  Keeping these muscles tense, lower your leg.  Relax the muscles slowly and completely in between each repetition. Repeat __________ times. Complete this exercise __________ times per day.  STRENGTH - Hamstring, Curls  Lay on your stomach with your legs extended. (If you lay on a bed, your feet  may hang over the edge.)  Tighten the muscles in the back of your thigh to bend your right / left knee up to 90 degrees. Keep your hips flat on the bed/floor.  Hold this position for __________ seconds.  Slowly lower your leg back to the starting position. Repeat __________ times. Complete this exercise __________ times per day.  OPTIONAL ANKLE WEIGHTS: Begin with ____________________, but DO NOT exceed ____________________. Increase in 1 pound/0.5 kilogram increments.  STRENGTH - Quadriceps, Squats  Stand in a door frame so that your feet and knees are in line with the frame.  Use your hands for balance, not support, on the frame.  Slowly lower your weight, bending at the hips and knees. Keep your lower legs upright so that they are parallel with the door frame. Squat only within the range that does not increase your knee pain. Never let your hips drop below your knees.  Slowly return upright, pushing with your legs, not pulling with your hands. Repeat __________ times. Complete this exercise __________ times per day.  STRENGTH - Quadriceps, Wall Slides  Follow guidelines for form closely. Increased knee pain often results from poorly placed feet or knees.    Lean against a smooth wall or door and walk your feet out 18-24 inches. Place your feet hip-width apart.  Slowly slide down the wall or door until your knees bend __________ degrees.* Keep your knees over your heels, not your toes, and in line with your hips, not falling to either side.  Hold for __________ seconds. Stand up to rest for __________ seconds in between each repetition. Repeat __________ times. Complete this exercise __________ times per day. * Your physician, physical therapist, or athletic trainer will alter this angle based on your symptoms and progress.   This information is not intended to replace advice given to you by your health care provider. Make sure you discuss any questions you have with your health care  provider.   Document Released: 04/19/2005 Document Revised: 06/26/2014 Document Reviewed: 09/17/2008 [employer] Interactive Patient Education 2016 [employer] Generic Hip Exercises RANGE OF MOTION (ROM) AND STRETCHING EXERCISES  These exercises may help you when beginning to rehabilitate your injury. Doing them too aggressively can worsen your condition. Complete them slowly and gently. Your symptoms may resolve with or without further involvement from your physician, physical therapist or athletic trainer. While completing these exercises, remember:   Restoring tissue flexibility helps normal motion to return to the joints. This allows healthier, less painful movement and activity.  An effective stretch should be held for at least 30 seconds.  A stretch should never be painful. You should only feel a gentle lengthening or release in the stretched tissue. If these stretches worsen your symptoms even when done gently, consult your physician, physical therapist or athletic trainer. STRETCH - Hamstrings, Supine   Lie on your back. Loop a belt or towel over the ball of your right / left foot.  Straighten your right / left knee and slowly pull on the belt to raise your leg. Do not allow the right / left knee to bend. Keep your opposite leg flat on the floor.  Raise the leg until you feel a gentle stretch behind your right / left knee or thigh. Hold this position for __________ seconds. Repeat __________ times. Complete this stretch __________ times per day.  STRETCH - Hip Rotators   Lie on your back on a firm surface. Grasp your right / left knee with your right / left hand and your ankle with your opposite hand.  Keeping your hips and shoulders firmly planted, gently pull your right / left knee and rotate your lower leg toward your opposite shoulder until you feel a stretch in your buttocks.  Hold this stretch for __________ seconds. Repeat this stretch __________ times. Complete this  stretch __________ times per day. STRETCH - Hamstrings/Adductors, V-Sit   Sit on the floor with your legs extended in a large "V," keeping your knees straight.  With your head and chest upright, bend at your waist reaching for your right foot to stretch your left adductors.  You should feel a stretch in your left inner thigh. Hold for __________ seconds.  Return to the upright position to relax your leg muscles.  Continuing to keep your chest upright, bend straight forward at your waist to stretch your hamstrings.  You should feel a stretch behind both of your thighs and/or knees. Hold for __________ seconds.  Return to the upright position to relax your leg muscles.  Repeat steps 2 through 4 for opposite leg. Repeat __________ times. Complete this exercise __________ times per day.  STRETCHING - Hip Flexors, Lunge  Half kneel with your right /  left knee on the floor and your opposite knee bent and directly over your ankle.  Keep good posture with your head over your shoulders. Tighten your buttocks to point your tailbone downward; this will prevent your back from arching too much.  You should feel a gentle stretch in the front of your thigh and/or hip. If you do not feel any resistance, slightly slide your opposite foot forward and then slowly lunge forward so your knee once again lines up over your ankle. Be sure your tailbone remains pointed downward.  Hold this stretch for __________ seconds. Repeat __________ times. Complete this stretch __________ times per day. STRENGTHENING EXERCISES These exercises may help you when beginning to rehabilitate your injury. They may resolve your symptoms with or without further involvement from your physician, physical therapist or athletic trainer. While completing these exercises, remember:   Muscles can gain both the endurance and the strength needed for everyday activities through controlled exercises.  Complete these exercises as  instructed by your physician, physical therapist or athletic trainer. Progress the resistance and repetitions only as guided.  You may experience muscle soreness or fatigue, but the pain or discomfort you are trying to eliminate should never worsen during these exercises. If this pain does worsen, stop and make certain you are following the directions exactly. If the pain is still present after adjustments, discontinue the exercise until you can discuss the trouble with your clinician. STRENGTH - Hip Extensors, Bridge   Lie on your back on a firm surface. Bend your knees and place your feet flat on the floor.  Tighten your buttocks muscles and lift your bottom off the floor until your trunk is level with your thighs. You should feel the muscles in your buttocks and back of your thighs working. If you do not feel these muscles, slide your feet 1-2 inches further away from your buttocks.  Hold this position for __________ seconds.  Slowly lower your hips to the starting position and allow your buttock muscles relax completely before beginning the next repetition.  If this exercise is too easy, you may cross your arms over your chest. Repeat __________ times. Complete this exercise __________ times per day.  STRENGTH - Hip Abductors, Straight Leg Raises  Be aware of your form throughout the entire exercise so that you exercise the correct muscles. Sloppy form means that you are not strengthening the correct muscles.  Lie on your side so that your head, shoulders, knee and hip line up. You may bend your lower knee to help maintain your balance. Your right / left leg should be on top.  Roll your hips slightly forward, so that your hips are stacked directly over each other and your right / left knee is facing forward.  Lift your top leg up 4-6 inches, leading with your heel. Be sure that your foot does not drift forward or that your knee does not roll toward the ceiling.  Hold this position for  __________ seconds. You should feel the muscles in your outer hip lifting (you may not notice this until your leg begins to tire).  Slowly lower your leg to the starting position. Allow the muscles to fully relax before beginning the next repetition. Repeat __________ times. Complete this exercise __________ times per day.  STRENGTH - Hip Adductors, Straight Leg Raises   Lie on your side so that your head, shoulders, knee and hip line up. You may place your upper foot in front to help maintain your balance. Your right /  left leg should be on the bottom.  Roll your hips slightly forward, so that your hips are stacked directly over each other and your right / left knee is facing forward.  Tense the muscles in your inner thigh and lift your bottom leg 4-6 inches. Hold this position for __________ seconds.  Slowly lower your leg to the starting position. Allow the muscles to fully relax before beginning the next repetition. Repeat __________ times. Complete this exercise __________ times per day.  STRENGTH - Quadriceps, Straight Leg Raises  Quality counts! Watch for signs that the quadriceps muscle is working to insure you are strengthening the correct muscles and not "cheating" by substituting with healthier muscles.  Lay on your back with your right / left leg extended and your opposite knee bent.  Tense the muscles in the front of your right / left thigh. You should see either your knee cap slide up or increased dimpling just above the knee. Your thigh may even quiver.  Tighten these muscles even more and raise your leg 4 to 6 inches off the floor. Hold for right / left seconds.  Keeping these muscles tense, lower your leg.  Relax the muscles slowly and completely in between each repetition. Repeat __________ times. Complete this exercise __________ times per day.  STRENGTH - Hip Abductors, Standing  Tie one end of a rubber exercise band/tubing to a secure surface (table, pole) and tie a  loop at the other end.  Place the loop around your right / left ankle. Keeping your ankle with the band directly opposite of the secured end, step away until there is tension in the tube/band.  Hold onto a chair as needed for balance.  Keeping your back upright, your shoulders over your hips, and your toes pointing forward, lift your right / left leg out to your side. Be sure to lift your leg with your hip muscles. Do not "throw" your leg or tip your body to lift your leg.  Slowly and with control, return to the starting position. Repeat exercise __________ times. Complete this exercise __________ times per day.  STRENGTH - Quadriceps, Squats  Stand in a door frame so that your feet and knees are in line with the frame.  Use your hands for balance, not support, on the frame.  Slowly lower your weight, bending at the hips and knees. Keep your lower legs upright so that they are parallel with the door frame. Squat only within the range that does not increase your knee pain. Never let your hips drop below your knees.  Slowly return upright, pushing with your legs, not pulling with your hands.   This information is not intended to replace advice given to you by your health care provider. Make sure you discuss any questions you have with your health care provider.   Document Released: 06/23/2005 Document Revised: 06/26/2014 Document Reviewed: 09/17/2008 Elsevier Interactive Patient Education [employer].

## 2015-10-08 NOTE — Assessment & Plan Note (Signed)
Appointment scheduled with [facility]. On cancellation list. Soak foot in epsom salts. Call with concerns.

## 2015-10-08 NOTE — Assessment & Plan Note (Signed)
To see orthopedics. Exercises given. Call with any concerns. Needs hip replacement.

## 2015-10-15 ENCOUNTER — Telehealth: Payer: Self-pay | Admitting: Family Medicine

## 2015-10-15 DIAGNOSIS — G453 Amaurosis fugax: Secondary | ICD-10-CM

## 2015-10-15 NOTE — Telephone Encounter (Signed)
Routed to Dr Johnson.

## 2015-10-15 NOTE — Telephone Encounter (Signed)
Pharmacy called and stated that his insurance will no longer cover dipyridamole-aspirin (AGGRENOX) 200-25 MG 12hr capsule and they would like to try plavix

## 2015-10-18 MED ORDER — CLOPIDOGREL BISULFATE 75 MG PO TABS: 75.0000 mg | ORAL_TABLET | Freq: Every day | ORAL | Status: DC

## 2015-10-18 NOTE — Telephone Encounter (Signed)
I spoke with patient, he states he is completely out of the Aggrenox.  He states he didn't go back to the neurologist at [facility] because his insurance didn't cover it. He stated he'd be willing to go to a different neurologist not in [place].

## 2015-10-18 NOTE — Telephone Encounter (Signed)
Plavix sent to his pharmacy. New neurology referral put in today.

## 2015-10-18 NOTE — Telephone Encounter (Signed)
Continue aggrenox if has some, otherwise will change to plavix, but needs to follow up with neurology at [facility]

## 2015-10-22 ENCOUNTER — Ambulatory Visit: Payer: Commercial Managed Care - HMO | Admitting: Family Medicine

## 2015-10-22 ENCOUNTER — Ambulatory Visit (INDEPENDENT_AMBULATORY_CARE_PROVIDER_SITE_OTHER): Payer: 59 | Admitting: Family Medicine

## 2015-10-22 ENCOUNTER — Encounter: Payer: Self-pay | Admitting: Family Medicine

## 2015-10-22 VITALS — BP 132/83 | HR 83 | Temp 98.5°F | Wt 289.0 lb

## 2015-10-22 DIAGNOSIS — I10 Essential (primary) hypertension: Secondary | ICD-10-CM | POA: Diagnosis not present

## 2015-10-22 DIAGNOSIS — M25551 Pain in right hip: Secondary | ICD-10-CM | POA: Diagnosis not present

## 2015-10-22 DIAGNOSIS — I951 Orthostatic hypotension: Secondary | ICD-10-CM | POA: Diagnosis not present

## 2015-10-22 MED ORDER — SPIRONOLACTONE 25 MG PO TABS: 25.0000 mg | ORAL_TABLET | Freq: Once | ORAL | Status: DC

## 2015-10-22 NOTE — Assessment & Plan Note (Signed)
Under good control. Orthostatic resolved. Having headaches off of it. Will restart 1/2 dose and check back in in 3 months. Call if getting dizzy and stop spironalactone.

## 2015-10-22 NOTE — Progress Notes (Signed)
BP 132/83 mmHg  Pulse 83  Temp(Src) 98.5 F (36.9 C)  Wt 289 lb (131.09 kg)  SpO2 97%   Subjective:    Patient ID: Rickey Glover., male    DOB: [birth year], 55 y.o.   MRN: [id number]  HPI: Rickey Glover. is a 55 y.o. male  Chief Complaint  Patient presents with  . Hypertension   HYPERTENSION- was having some orthostatic hypotension last visit and was taken off of his spironalactone and is here today to see how he's doing. He has been feeling better. But he notes that he gets bad headaches in the evening and thinks they are due to his blood pressure. Would like to go back on 1/2 his spironalactone.  Hypertension status: controlled  Satisfied with current treatment? yes Duration of hypertension: chronic BP monitoring frequency:  not checking BP medication side effects:  no Medication compliance: excellent compliance Aspirin: no Recurrent headaches: yes Visual changes: no Palpitations: no Dyspnea: no Chest pain: no Lower extremity edema: no Dizzy/lightheaded: no   Has not seen ortho yet. Hip still really hurting. Causing him a lot of discomfort. Wants to know if he can get a steroid shot in it or something.   Relevant past medical, surgical, family and social history reviewed and updated as indicated. Interim medical history since our last visit reviewed. Allergies and medications reviewed and updated.  Review of Systems  Constitutional: Negative.   Respiratory: Negative.   Cardiovascular: Negative.   Musculoskeletal: Positive for myalgias, back pain, joint swelling, arthralgias and gait problem. Negative for neck pain and neck stiffness.  Psychiatric/Behavioral: Negative.     Per HPI unless specifically indicated above     Objective:    BP 132/83 mmHg  Pulse 83  Temp(Src) 98.5 F (36.9 C)  Wt 289 lb (131.09 kg)  SpO2 97%  Wt Readings from Last 3 Encounters:  10/22/15 289 lb (131.09 kg)  10/08/15 288 lb (130.636 kg)  10/04/15 283 lb (128.368 kg)     Orthostatic VS for the past 24 hrs:  BP- Lying Pulse- Lying BP- Sitting Pulse- Sitting BP- Standing at 0 minutes Pulse- Standing at 0 minutes  10/22/15 1028 121/79 mmHg 80 122/80 mmHg 82 125/82 mmHg 89   Physical Exam  Constitutional: He is oriented to person, place, and time. He appears well-developed and well-nourished. No distress.  HENT:  Head: Normocephalic and atraumatic.  Right Ear: Hearing normal.  Left Ear: Hearing normal.  Nose: Nose normal.  Eyes: Conjunctivae and lids are normal. Right eye exhibits no discharge. Left eye exhibits no discharge. No scleral icterus.  Cardiovascular: Normal rate, regular rhythm, normal heart sounds and intact distal pulses.  Exam reveals no gallop and no friction rub.   No murmur heard. Pulmonary/Chest: Effort normal and breath sounds normal. No respiratory distress. He has no wheezes. He has no rales. He exhibits no tenderness.  Musculoskeletal:  Antalgic gait   Neurological: He is alert and oriented to person, place, and time.  Skin: Skin is warm, dry and intact. No rash noted. No erythema. No pallor.  Psychiatric: He has a normal mood and affect. His speech is normal and behavior is normal. Judgment and thought content normal. Cognition and memory are normal.  Nursing note and vitals reviewed.   Results for orders placed or performed in visit on 10/04/15  CBC with Differential/Platelet  Result Value Ref Range   WBC 12.9 (H) 3.4 - 10.8 x10E3/uL   RBC 4.89 4.14 - 5.80 x10E6/uL   Hemoglobin 14.4 12.6 -  17.7 g/dL   Hematocrit 42.6 37.5 - 51.0 %   MCV 87 79 - 97 fL   MCH 29.4 26.6 - 33.0 pg   MCHC 33.8 31.5 - 35.7 g/dL   RDW 13.3 12.3 - 15.4 %   Platelets 424 (H) 150 - 379 x10E3/uL   Neutrophils 61 %   Lymphs 30 %   Monocytes 6 %   Eos 2 %   Basos 0 %   Neutrophils Absolute 8.0 (H) 1.4 - 7.0 x10E3/uL   Lymphocytes Absolute 3.8 (H) 0.7 - 3.1 x10E3/uL   Monocytes Absolute 0.8 0.1 - 0.9 x10E3/uL   EOS (ABSOLUTE) 0.3 0.0 - 0.4  x10E3/uL   Basophils Absolute 0.0 0.0 - 0.2 x10E3/uL   Immature Granulocytes 1 %   Immature Grans (Abs) 0.1 0.0 - 0.1 x10E3/uL  Comprehensive metabolic panel  Result Value Ref Range   Glucose 104 (H) 65 - 99 mg/dL   BUN 18 6 - 24 mg/dL   Creatinine, Ser 1.47 (H) 0.76 - 1.27 mg/dL   GFR calc non Af Amer 54 (L) >59 mL/min/1.73   GFR calc Af Amer 63 >59 mL/min/1.73   BUN/Creatinine Ratio 12 9 - 20   Sodium 141 134 - 144 mmol/L   Potassium 5.4 (H) 3.5 - 5.2 mmol/L   Chloride 98 96 - 106 mmol/L   CO2 21 18 - 29 mmol/L   Calcium 9.6 8.7 - 10.2 mg/dL   Total Protein 6.8 6.0 - 8.5 g/dL   Albumin 4.5 3.5 - 5.5 g/dL   Globulin, Total 2.3 1.5 - 4.5 g/dL   Albumin/Globulin Ratio 2.0 1.2 - 2.2   Bilirubin Total 0.3 0.0 - 1.2 mg/dL   Alkaline Phosphatase 79 39 - 117 IU/L   AST 11 0 - 40 IU/L   ALT 7 0 - 44 IU/L  Lipid Panel w/o Chol/HDL Ratio  Result Value Ref Range   Cholesterol, Total 169 100 - 199 mg/dL   Triglycerides 268 (H) 0 - 149 mg/dL   HDL 35 (L) >39 mg/dL   VLDL Cholesterol Cal 54 (H) 5 - 40 mg/dL   LDL Calculated 80 0 - 99 mg/dL  Microalbumin, Urine Waived  Result Value Ref Range   Microalb, Ur Waived 10 0 - 19 mg/L   Creatinine, Urine Waived 200 10 - 300 mg/dL   Microalb/Creat Ratio <30 <30 mg/g  PSA  Result Value Ref Range   Prostate Specific Ag, Serum 1.9 0.0 - 4.0 ng/mL  UA/M w/rflx Culture, Routine  Result Value Ref Range   Specific Gravity, UA 1.020 1.005 - 1.030   pH, UA 5.0 5.0 - 7.5   Color, UA Yellow Yellow   Appearance Ur Clear Clear   Leukocytes, UA Negative Negative   Protein, UA Negative Negative/Trace   Glucose, UA Negative Negative   Ketones, UA Negative Negative   RBC, UA Negative Negative   Bilirubin, UA Negative Negative   Urobilinogen, Ur 0.2 0.2 - 1.0 mg/dL   Nitrite, UA Negative Negative      Assessment & Plan:   Problem List Items Addressed This Visit      Cardiovascular and Mediastinum   Essential hypertension    Under good control.  Orthostatic resolved. Having headaches off of it. Will restart 1/2 dose and check back in in 3 months. Call if getting dizzy and stop spironalactone.       Relevant Medications   spironolactone (ALDACTONE) 25 MG tablet   RESOLVED: Orthostatic hypotension - Primary   Relevant Medications  spironolactone (ALDACTONE) 25 MG tablet     Other   Right hip pain    To follow up with orthopedics next week. Will mention it to them. Call with any concerns.          Follow up plan: Return August , for Follow up.

## 2015-10-22 NOTE — Assessment & Plan Note (Signed)
To follow up with orthopedics next week. Will mention it to them. Call with any concerns.

## 2015-10-26 ENCOUNTER — Encounter: Payer: Self-pay | Admitting: Sports Medicine

## 2015-10-26 ENCOUNTER — Ambulatory Visit (INDEPENDENT_AMBULATORY_CARE_PROVIDER_SITE_OTHER): Payer: Commercial Managed Care - HMO | Admitting: Sports Medicine

## 2015-10-26 DIAGNOSIS — M79671 Pain in right foot: Secondary | ICD-10-CM | POA: Diagnosis not present

## 2015-10-26 DIAGNOSIS — Z7901 Long term (current) use of anticoagulants: Secondary | ICD-10-CM

## 2015-10-26 DIAGNOSIS — M79672 Pain in left foot: Secondary | ICD-10-CM

## 2015-10-26 DIAGNOSIS — E1142 Type 2 diabetes mellitus with diabetic polyneuropathy: Secondary | ICD-10-CM | POA: Diagnosis not present

## 2015-10-26 DIAGNOSIS — B351 Tinea unguium: Secondary | ICD-10-CM | POA: Diagnosis not present

## 2015-10-26 NOTE — Patient Instructions (Signed)
Diabetes and Foot Care Diabetes may cause you to have problems because of poor blood supply (circulation) to your feet and legs. This may cause the skin on your feet to become thinner, break easier, and heal more slowly. Your skin may become dry, and the skin may peel and crack. You may also have nerve damage in your legs and feet causing decreased feeling in them. You may not notice minor injuries to your feet that could lead to infections or more serious problems. Taking care of your feet is one of the most important things you can do for yourself.  HOME CARE INSTRUCTIONS  Wear shoes at all times, even in the house. Do not go barefoot. Bare feet are easily injured.  Check your feet daily for blisters, cuts, and redness. If you cannot see the bottom of your feet, use a mirror or ask someone for help.  Wash your feet with warm water (do not use hot water) and mild soap. Then pat your feet and the areas between your toes until they are completely dry. Do not soak your feet as this can dry your skin.  Apply a moisturizing lotion or petroleum jelly (that does not contain alcohol and is unscented) to the skin on your feet and to dry, brittle toenails. Do not apply lotion between your toes.  Trim your toenails straight across. Do not dig under them or around the cuticle. File the edges of your nails with an emery board or nail file.  Do not cut corns or calluses or try to remove them with medicine.  Wear clean socks or stockings every day. Make sure they are not too tight. Do not wear knee-high stockings since they may decrease blood flow to your legs.  Wear shoes that fit properly and have enough cushioning. To break in new shoes, wear them for just a few hours a day. This prevents you from injuring your feet. Always look in your shoes before you put them on to be sure there are no objects inside.  Do not cross your legs. This may decrease the blood flow to your feet.  If you find a minor scrape,  cut, or break in the skin on your feet, keep it and the skin around it clean and dry. These areas may be cleansed with mild soap and water. Do not cleanse the area with peroxide, alcohol, or iodine.  When you remove an adhesive bandage, be sure not to damage the skin around it.  If you have a wound, look at it several times a day to make sure it is healing.  Do not use heating pads or hot water bottles. They may burn your skin. If you have lost feeling in your feet or legs, you may not know it is happening until it is too late.  Make sure your health care provider performs a complete foot exam at least annually or more often if you have foot problems. Report any cuts, sores, or bruises to your health care provider immediately. SEEK MEDICAL CARE IF:   You have an injury that is not healing.  You have cuts or breaks in the skin.  You have an ingrown nail.  You notice redness on your legs or feet.  You feel burning or tingling in your legs or feet.  You have pain or cramps in your legs and feet.  Your legs or feet are numb.  Your feet always feel cold. SEEK IMMEDIATE MEDICAL CARE IF:   There is increasing redness,   swelling, or pain in or around a wound.  There is a red line that goes up your leg.  Pus is coming from a wound.  You develop a fever or as directed by your health care provider.  You notice a bad smell coming from an ulcer or wound.   This information is not intended to replace advice given to you by your health care provider. Make sure you discuss any questions you have with your health care provider.   Document Released: 06/02/2000 Document Revised: 02/05/2013 Document Reviewed: 11/12/2012 Elsevier Interactive Patient Education 2016 Elsevier Inc.  

## 2015-10-26 NOTE — Progress Notes (Signed)
Patient ID: Rickey Glover., male   DOB: Sep 12, 1960, 55 y.o.   MRN: [id number] Subjective: Rickey Glover. is a 55 y.o. male patient with history of diabetes who presents to office today complaining of long, painful nails  while ambulating in shoes; unable to trim. Patient states that the glucose reading this morning was 110 mg/dl. Patient denies any new changes in medication or new problems. Patient denies any new cramping, numbness, burning or tingling in the legs.  Patient Active Problem List   Diagnosis Date Noted  . Hypertrophic toenail 10/08/2015  . Right hip pain 10/08/2015  . Special screening for malignant neoplasms, colon   . Chronic pain 06/30/2015  . Tobacco abuse 06/07/2015  . Amaurosis fugax of left eye 06/07/2015  . OSA (obstructive sleep apnea) 06/07/2015  . Insomnia 04/30/2015  . Leukocytosis 04/29/2015  . Type 2 diabetes mellitus with hyperglycemia, without long-term current use of insulin ([place]) 04/27/2015  . Right knee pain 04/27/2015  . DDD (degenerative disc disease), lumbar 04/27/2015  . Essential hypertension 04/27/2015  . HLD (hyperlipidemia) 04/27/2015  . Obesity 04/27/2015  . Bodies, loose, joint, knee 02/26/2015  . Degenerative arthritis of hip 02/01/2015  . Hallux abductovalgus with bunions 06/16/2014  . Hook nail 06/16/2014  . Fungal infection of nail 06/16/2014  . Foot pain 06/16/2014  . Lacunar infarction ([place]) 05/25/2014  . Lumbar radiculopathy 05/08/2014  . Morbid (severe) obesity due to excess calories ([place]) 02/05/2014  . Obstructive apnea 02/05/2014  . ED (erectile dysfunction) of organic origin 12/16/2013  . History of surgical procedure 10/06/2013  . Arthritis of knee, degenerative 08/12/2012  . Lumbar canal stenosis 06/04/2012  . Congestive heart failure ([place]) 02/12/2012  . Chronic pancreatitis ([place]) 02/05/2012  . Type 2 diabetes mellitus ([place]) 02/05/2012   Current Outpatient Prescriptions on File Prior to Visit  Medication Sig  Dispense Refill  . amLODipine (NORVASC) 10 MG tablet Take 10 mg by mouth daily.    Marland Kitchen aspirin 81 MG tablet Take 81 mg by mouth daily.    . clopidogrel (PLAVIX) 75 MG tablet Take 1 tablet (75 mg total) by mouth daily. 90 tablet 3  . furosemide (LASIX) 40 MG tablet Take 40 mg by mouth daily.    Marland Kitchen gabapentin (NEURONTIN) 100 MG capsule Take 1 capsule (100 mg total) by mouth 3 (three) times daily. 300 qAM for a week, 200 qAM for a week, then 100 for a week, then stop 90 capsule 0  . glimepiride (AMARYL) 4 MG tablet Take 4 mg by mouth 2 (two) times daily.    Marland Kitchen HYDROcodone-acetaminophen (NORCO) 10-325 MG tablet Take 1 tablet by mouth every 6 (six) hours as needed. 100 tablet 0  . metFORMIN (GLUCOPHAGE-XR) 500 MG 24 hr tablet TAKE 2 TABLETS BY MOUTH TWICE DAILY WITH MEALS 120 tablet 6  . quinapril (ACCUPRIL) 40 MG tablet Take 40 mg by mouth at bedtime.    Marland Kitchen spironolactone (ALDACTONE) 25 MG tablet Take 1 tablet (25 mg total) by mouth once. 30 tablet 6   No current facility-administered medications on file prior to visit.   Allergies  Allergen Reactions  . Percocet [Oxycodone-Acetaminophen]     Recent Results (from the past 2160 hour(s))  Bayer DCA Hb A1c Waived     Status: Abnormal   Collection Time: 07/30/15 11:05 AM  Result Value Ref Range   Bayer DCA Hb A1c Waived 7.3 (H) <7.0 %    Comment:  Diabetic Adult            <7.0                                       Healthy Adult        4.3 - 5.7                                                           (DCCT/NGSP) American Diabetes Association's Summary of Glycemic Recommendations for Adults with Diabetes: Hemoglobin A1c <7.0%. More stringent glycemic goals (A1c <6.0%) may further reduce complications at the cost of increased risk of hypoglycemia.   Uric acid     Status: None   Collection Time: 07/30/15 11:06 AM  Result Value Ref Range   Uric Acid 7.4 3.7 - 8.6 mg/dL    Comment:            Therapeutic  target for gout patients: <6.0  HM DIABETES EYE EXAM     Status: None   Collection Time: 08/20/15 12:57 PM  Result Value Ref Range   HM Diabetic Eye Exam No Retinopathy No Retinopathy  HM DIABETES EYE EXAM     Status: None   Collection Time: 08/20/15  1:03 PM  Result Value Ref Range   HM Diabetic Eye Exam No Retinopathy No Retinopathy  CBC with Differential/Platelet     Status: Abnormal   Collection Time: 10/04/15  2:09 PM  Result Value Ref Range   WBC 12.9 (H) 3.4 - 10.8 x10E3/uL   RBC 4.89 4.14 - 5.80 x10E6/uL   Hemoglobin 14.4 12.6 - 17.7 g/dL   Hematocrit 42.6 37.5 - 51.0 %   MCV 87 79 - 97 fL   MCH 29.4 26.6 - 33.0 pg   MCHC 33.8 31.5 - 35.7 g/dL   RDW 13.3 12.3 - 15.4 %   Platelets 424 (H) 150 - 379 x10E3/uL   Neutrophils 61 %   Lymphs 30 %   Monocytes 6 %   Eos 2 %   Basos 0 %   Neutrophils Absolute 8.0 (H) 1.4 - 7.0 x10E3/uL   Lymphocytes Absolute 3.8 (H) 0.7 - 3.1 x10E3/uL   Monocytes Absolute 0.8 0.1 - 0.9 x10E3/uL   EOS (ABSOLUTE) 0.3 0.0 - 0.4 x10E3/uL   Basophils Absolute 0.0 0.0 - 0.2 x10E3/uL   Immature Granulocytes 1 %   Immature Grans (Abs) 0.1 0.0 - 0.1 x10E3/uL  Comprehensive metabolic panel     Status: Abnormal   Collection Time: 10/04/15  2:09 PM  Result Value Ref Range   Glucose 104 (H) 65 - 99 mg/dL   BUN 18 6 - 24 mg/dL   Creatinine, Ser 1.47 (H) 0.76 - 1.27 mg/dL   GFR calc non Af Amer 54 (L) >59 mL/min/1.73   GFR calc Af Amer 63 >59 mL/min/1.73   BUN/Creatinine Ratio 12 9 - 20   Sodium 141 134 - 144 mmol/L   Potassium 5.4 (H) 3.5 - 5.2 mmol/L   Chloride 98 96 - 106 mmol/L   CO2 21 18 - 29 mmol/L   Calcium 9.6 8.7 - 10.2 mg/dL   Total Protein 6.8 6.0 - 8.5 g/dL   Albumin 4.5 3.5 - 5.5 g/dL   Globulin, Total  2.3 1.5 - 4.5 g/dL   Albumin/Globulin Ratio 2.0 1.2 - 2.2   Bilirubin Total 0.3 0.0 - 1.2 mg/dL   Alkaline Phosphatase 79 39 - 117 IU/L   AST 11 0 - 40 IU/L   ALT 7 0 - 44 IU/L  Lipid Panel w/o Chol/HDL Ratio     Status: Abnormal    Collection Time: 10/04/15  2:09 PM  Result Value Ref Range   Cholesterol, Total 169 100 - 199 mg/dL   Triglycerides 268 (H) 0 - 149 mg/dL   HDL 35 (L) >39 mg/dL   VLDL Cholesterol Cal 54 (H) 5 - 40 mg/dL   LDL Calculated 80 0 - 99 mg/dL  PSA     Status: None   Collection Time: 10/04/15  2:09 PM  Result Value Ref Range   Prostate Specific Ag, Serum 1.9 0.0 - 4.0 ng/mL    Comment: Roche ECLIA methodology. According to the American Urological Association, Serum PSA should decrease and remain at undetectable levels after radical prostatectomy. The AUA defines biochemical recurrence as an initial PSA value 0.2 ng/mL or greater followed by a subsequent confirmatory PSA value 0.2 ng/mL or greater. Values obtained with different assay methods or kits cannot be used interchangeably. Results cannot be interpreted as absolute evidence of the presence or absence of malignant disease.   Microalbumin, Urine Waived     Status: None   Collection Time: 10/04/15  2:10 PM  Result Value Ref Range   Microalb, Ur Waived 10 0 - 19 mg/L   Creatinine, Urine Waived 200 10 - 300 mg/dL   Microalb/Creat Ratio <30 <30 mg/g    Comment:                              Abnormal:       30 - 300                         High Abnormal:           >300   UA/M w/rflx Culture, Routine     Status: None   Collection Time: 10/04/15  2:10 PM  Result Value Ref Range   Specific Gravity, UA 1.020 1.005 - 1.030   pH, UA 5.0 5.0 - 7.5   Color, UA Yellow Yellow   Appearance Ur Clear Clear   Leukocytes, UA Negative Negative   Protein, UA Negative Negative/Trace   Glucose, UA Negative Negative   Ketones, UA Negative Negative   RBC, UA Negative Negative   Bilirubin, UA Negative Negative   Urobilinogen, Ur 0.2 0.2 - 1.0 mg/dL   Nitrite, UA Negative Negative    Objective: General: Patient is awake, alert, and oriented x 3 and in no acute distress.  Integument: Skin is warm, dry and supple bilateral. Nails are tender, long,  thickened and  dystrophic with subungual debris, consistent with onychomycosis, 1-5 bilateral with right hallux nail being most thickened and involved. Left hallux previous nail avulsion with minimal nail present. No signs of infection. No open lesions or preulcerative lesions present bilateral. Remaining integument unremarkable.  Vasculature:  Dorsalis Pedis pulse 2/4 bilateral. Posterior Tibial pulse 1 /4 bilateral.  Capillary fill time <3 sec 1-5 bilateral. Scant hair growth to the level of the digits. Temperature gradient within normal limits. No varicosities present bilateral. No edema present bilateral.   Neurology: The patient has intact sensation measured with a 5.07/10g Semmes Weinstein Monofilament at  all pedal sites bilateral . Vibratory sensation diminished bilateral with tuning fork. No Babinski sign present bilateral.   Musculoskeletal:Bunion and long 2nd toe pedal deformities noted bilateral. Muscular strength 5/5 in all lower extremity muscular groups bilateral without pain on range of motion . No tenderness with calf compression bilateral.  Assessment and Plan: Problem List Items Addressed This Visit    None    Visit Diagnoses    Dermatophytosis of nail    -  Primary    Foot pain, bilateral        Diabetic polyneuropathy associated with type 2 diabetes mellitus ([place])           -Examined patient. -Discussed and educated patient on diabetic foot care, especially with  regards to the vascular, neurological and musculoskeletal systems.  -Stressed the importance of good glycemic control and the detriment of not  controlling glucose levels in relation to the foot. -Mechanically debrided all nails 1-5 bilateral using sterile nail nipper and filed with dremel without incident -Answered all patient questions -Patient to return  in 3 months for at risk foot care. May perform nail procedure at next visit -Patient advised to call the office if any problems or questions arise in the  meantime.  Landis Martins, DPM

## 2015-10-27 ENCOUNTER — Other Ambulatory Visit: Payer: Self-pay | Admitting: Family Medicine

## 2015-10-27 DIAGNOSIS — Z79899 Other long term (current) drug therapy: Secondary | ICD-10-CM | POA: Insufficient documentation

## 2015-10-27 DIAGNOSIS — G8929 Other chronic pain: Secondary | ICD-10-CM

## 2015-10-27 MED ORDER — HYDROCODONE-ACETAMINOPHEN 10-325 MG PO TABS: 1.0000 | ORAL_TABLET | Freq: Four times a day (QID) | ORAL | Status: DC | PRN

## 2015-11-02 ENCOUNTER — Other Ambulatory Visit: Payer: 59

## 2015-12-01 ENCOUNTER — Ambulatory Visit: Payer: Self-pay | Admitting: Neurology

## 2015-12-01 DIAGNOSIS — Z029 Encounter for administrative examinations, unspecified: Secondary | ICD-10-CM

## 2015-12-13 ENCOUNTER — Other Ambulatory Visit: Payer: Self-pay | Admitting: Family Medicine

## 2015-12-15 DIAGNOSIS — F17210 Nicotine dependence, cigarettes, uncomplicated: Secondary | ICD-10-CM | POA: Diagnosis not present

## 2015-12-15 DIAGNOSIS — K859 Acute pancreatitis without necrosis or infection, unspecified: Secondary | ICD-10-CM | POA: Diagnosis not present

## 2015-12-15 DIAGNOSIS — K4020 Bilateral inguinal hernia, without obstruction or gangrene, not specified as recurrent: Secondary | ICD-10-CM | POA: Diagnosis not present

## 2015-12-15 DIAGNOSIS — Z7982 Long term (current) use of aspirin: Secondary | ICD-10-CM | POA: Diagnosis not present

## 2015-12-15 DIAGNOSIS — Z9884 Bariatric surgery status: Secondary | ICD-10-CM | POA: Diagnosis not present

## 2015-12-15 DIAGNOSIS — K5900 Constipation, unspecified: Secondary | ICD-10-CM | POA: Diagnosis not present

## 2015-12-15 DIAGNOSIS — R918 Other nonspecific abnormal finding of lung field: Secondary | ICD-10-CM | POA: Diagnosis not present

## 2015-12-15 DIAGNOSIS — R112 Nausea with vomiting, unspecified: Secondary | ICD-10-CM | POA: Diagnosis not present

## 2015-12-15 DIAGNOSIS — K5730 Diverticulosis of large intestine without perforation or abscess without bleeding: Secondary | ICD-10-CM | POA: Diagnosis not present

## 2015-12-15 DIAGNOSIS — I10 Essential (primary) hypertension: Secondary | ICD-10-CM | POA: Diagnosis not present

## 2015-12-15 DIAGNOSIS — R1032 Left lower quadrant pain: Secondary | ICD-10-CM | POA: Diagnosis not present

## 2015-12-15 DIAGNOSIS — Z9049 Acquired absence of other specified parts of digestive tract: Secondary | ICD-10-CM | POA: Diagnosis not present

## 2015-12-16 ENCOUNTER — Encounter: Payer: Self-pay | Admitting: Family Medicine

## 2015-12-16 ENCOUNTER — Ambulatory Visit (INDEPENDENT_AMBULATORY_CARE_PROVIDER_SITE_OTHER): Payer: Commercial Managed Care - HMO | Admitting: Family Medicine

## 2015-12-16 VITALS — BP 99/69 | HR 85 | Temp 98.2°F | Ht 73.5 in | Wt 294.0 lb

## 2015-12-16 DIAGNOSIS — M1611 Unilateral primary osteoarthritis, right hip: Secondary | ICD-10-CM | POA: Diagnosis not present

## 2015-12-16 DIAGNOSIS — E1165 Type 2 diabetes mellitus with hyperglycemia: Secondary | ICD-10-CM | POA: Diagnosis not present

## 2015-12-16 DIAGNOSIS — K861 Other chronic pancreatitis: Secondary | ICD-10-CM

## 2015-12-16 DIAGNOSIS — G4733 Obstructive sleep apnea (adult) (pediatric): Secondary | ICD-10-CM

## 2015-12-16 DIAGNOSIS — R918 Other nonspecific abnormal finding of lung field: Secondary | ICD-10-CM

## 2015-12-16 DIAGNOSIS — Z79899 Other long term (current) drug therapy: Secondary | ICD-10-CM | POA: Diagnosis not present

## 2015-12-16 DIAGNOSIS — G4700 Insomnia, unspecified: Secondary | ICD-10-CM

## 2015-12-16 DIAGNOSIS — I10 Essential (primary) hypertension: Secondary | ICD-10-CM

## 2015-12-16 DIAGNOSIS — G8929 Other chronic pain: Secondary | ICD-10-CM | POA: Diagnosis not present

## 2015-12-16 DIAGNOSIS — D72829 Elevated white blood cell count, unspecified: Secondary | ICD-10-CM

## 2015-12-16 LAB — BAYER DCA HB A1C WAIVED: HB A1C (BAYER DCA - WAIVED): 6.2 % (ref <7.0)

## 2015-12-16 MED ORDER — HYDROCODONE-ACETAMINOPHEN 10-325 MG PO TABS: 1.0000 | ORAL_TABLET | Freq: Four times a day (QID) | ORAL | Status: DC | PRN

## 2015-12-16 MED ORDER — CLONAZEPAM 0.5 MG PO TABS: ORAL_TABLET | ORAL | Status: DC

## 2015-12-16 NOTE — Assessment & Plan Note (Signed)
A1c down to 6.2. Continue current regimen. Continue to monitor. Recheck 6 months.

## 2015-12-16 NOTE — Patient Instructions (Addendum)
Call Dr. Sharyne Richters about your hip!  You should be getting a call about your pain center appointment. It's important you see them, because we can only continue to prescribe your pain medicine for another 6 months.   Your sleeping medicine Rx should last you 2 months.   I'll see you in a month.   Acute Pancreatitis Acute pancreatitis is a disease in which the pancreas becomes suddenly irritated (inflamed). The pancreas is a large gland behind your stomach. The pancreas makes enzymes that help digest food. The pancreas also makes 2 hormones that help control your blood sugar. Acute pancreatitis happens when the enzymes attack and damage the pancreas. Most attacks last a couple of days and can cause serious problems. HOME CARE  Follow your doctor's diet instructions. You may need to avoid alcohol and limit fat in your diet.  Eat small meals often.  Drink enough fluids to keep your pee (urine) clear or pale yellow.  Only take medicines as told by your doctor.  Avoid drinking alcohol if it caused your disease.  Do not smoke.  Get plenty of rest.  Check your blood sugar at home as told by your doctor.  Keep all doctor visits as told. GET HELP IF:  You do not get better as quickly as expected.  You have new or worsening symptoms.  You have lasting pain, weakness, or feel sick to your stomach (nauseous).  You get better and then have another pain attack. GET HELP RIGHT AWAY IF:   You are unable to eat or keep fluids down.  Your pain becomes severe.  You have a fever or lasting symptoms for more than 2 to 3 days.  You have a fever and your symptoms suddenly get worse.  Your skin or the white part of your eyes turn yellow (jaundice).  You throw up (vomit).  You feel dizzy, or you pass out (faint).  Your blood sugar is high (over 300 mg/dL). MAKE SURE YOU:   Understand these instructions.  Will watch your condition.  Will get help right away if you are not doing well or  get worse.   This information is not intended to replace advice given to you by your health care provider. Make sure you discuss any questions you have with your health care provider.   Document Released: 11/22/2007 Document Revised: 06/26/2014 Document Reviewed: 09/14/2011 Elsevier Interactive Patient Education [employer].

## 2015-12-16 NOTE — Assessment & Plan Note (Signed)
Will give him 1 more chance pending urine today. Will not put him on 28 day. Will need to come in monthly for Rx for a time. He will need to see pain management as we will only do this for 6 months.

## 2015-12-16 NOTE — Assessment & Plan Note (Signed)
Stable, low end of normal. No orthostasis. Continue to monitor. Call with any concerns.

## 2015-12-16 NOTE — Assessment & Plan Note (Addendum)
Will give him 1 more chance pending urine today. Will not put him on 28 day. Will need to come in monthly for Rx for a time. He will need to see pain management as we will only do this for 6 months. Call into pain management for them to call the patient again or call us so we can set up appointment.

## 2015-12-16 NOTE — Assessment & Plan Note (Signed)
To discuss with his orthopedist- missed last appointment. He will call them.

## 2015-12-16 NOTE — Progress Notes (Signed)
BP 99/69 mmHg  Pulse 85  Temp(Src) 98.2 F (36.8 C)  Ht 6' 1.5" (1.867 m)  Wt 294 lb (133.358 kg)  BMI 38.26 kg/m2  SpO2 96%   Subjective:    Patient ID: Rickey Glover., male    DOB: Oct 27, 1960, 55 y.o.   MRN: [id number]  HPI: Rickey Glover. is a 55 y.o. male  Chief Complaint  Patient presents with  . Hypertension  . Hip Pain  . Pancreatitis   ER FOLLOW UP Time since discharge: 1 day Hospital/facility: [facility] Diagnosis: Early pancreatitis, lung nodules Procedures/tests: Abdominal CT, blood work Consultants: None New medications: zofran, norco (did not fill Rx, confirmed on PMP) Discharge instructions:  Follow up here, clear liquid diet, rest. Abx, pain medicine, CT scan of chest to f/u lung nodules Status: stable  DIABETES Hypoglycemic episodes:no Polydipsia/polyuria: no Visual disturbance: no Chest pain: no Paresthesias: no Glucose Monitoring: no Taking Insulin?: no Blood Pressure Monitoring: a few times a month Retinal Examination: Up to date Foot Exam: Up to date Diabetic Education: Not Completed Pneumovax: Up to Date Influenza: up to date Aspirin: no  CHRONIC PAIN  Present dose: 40 Morphine equivalents Pain control status: exacerbated Duration: chronic Location: low back, R hip, bilateral knees Quality: dull, aching, sore and stabbing Current Pain Level: moderate Previous Pain Level: moderate Breakthrough pain: yes Benefit from narcotic medications: Unsure What Activities task can be accomplished with current medication?: Able to take care of self and work Interested in Careers adviser off narcotics:no  Stool softners/OTC fiber: no  Previous pain specialty evaluation: no Non-narcotic analgesic meds: yes- had been on gabapentin, didn't like it, made him sleepy.Weaned off last visit Narcotic contract: yes   INSOMNIA- medicine helping. Only taking it occasionally now. Still not on his CPAP. Has not heard anything from sleep medicine.   Duration: chronic Satisfied with sleep quality: yes Difficulty falling asleep: yes Difficulty staying asleep: yes Waking a few hours after sleep onset: yes Early morning awakenings: yes Daytime hypersomnolence: yes Wakes feeling refreshed: no Good sleep hygiene: no Apnea: yes- does not have his CPAP. Never got Snoring: yes Depressed/anxious mood: no Recent stress: no Restless legs/nocturnal leg cramps: no Chronic pain/arthritis: yes History of sleep study: yes Treatments attempted: melatonin, uinsom, benadryl and ambien    Relevant past medical, surgical, family and social history reviewed and updated as indicated. Interim medical history since our last visit reviewed. Allergies and medications reviewed and updated.  Review of Systems  Constitutional: Negative.   Respiratory: Negative.   Cardiovascular: Negative.   Gastrointestinal: Positive for abdominal pain. Negative for nausea, vomiting, diarrhea, constipation, blood in stool, abdominal distention, anal bleeding and rectal pain.  Musculoskeletal: Positive for myalgias, back pain, arthralgias and gait problem. Negative for joint swelling, neck pain and neck stiffness.  Psychiatric/Behavioral: Positive for sleep disturbance. Negative for suicidal ideas, hallucinations, behavioral problems, confusion, self-injury, dysphoric mood, decreased concentration and agitation. The patient is not nervous/anxious and is not hyperactive.     Per HPI unless specifically indicated above     Objective:    BP 99/69 mmHg  Pulse 85  Temp(Src) 98.2 F (36.8 C)  Ht 6' 1.5" (1.867 m)  Wt 294 lb (133.358 kg)  BMI 38.26 kg/m2  SpO2 96%  Wt Readings from Last 3 Encounters:  12/16/15 294 lb (133.358 kg)  10/22/15 289 lb (131.09 kg)  10/08/15 288 lb (130.636 kg)    Orthostatic VS for the past 24 hrs:  BP- Lying Pulse- Lying BP- Sitting Pulse- Sitting BP- Standing  at 0 minutes Pulse- Standing at 0 minutes  12/16/15 1504 114/75 mmHg 81  104/68 mmHg 86 120/77 mmHg 101    Physical Exam  Constitutional: He is oriented to person, place, and time. He appears well-developed and well-nourished. No distress.  HENT:  Head: Normocephalic and atraumatic.  Right Ear: Hearing normal.  Left Ear: Hearing normal.  Nose: Nose normal.  Eyes: Conjunctivae and lids are normal. Right eye exhibits no discharge. Left eye exhibits no discharge. No scleral icterus.  Cardiovascular: Normal rate, regular rhythm, normal heart sounds and intact distal pulses.  Exam reveals no gallop and no friction rub.   No murmur heard. Pulmonary/Chest: Effort normal and breath sounds normal. No respiratory distress. He has no wheezes. He has no rales. He exhibits no tenderness.  Abdominal: He exhibits no distension and no mass. There is tenderness (mildly, diffuse). There is no rebound and no guarding.  Musculoskeletal:  Antalgic gait  Neurological: He is alert and oriented to person, place, and time.  Skin: Skin is warm, dry and intact. No rash noted. No erythema. No pallor.  Psychiatric: He has a normal mood and affect. His speech is normal and behavior is normal. Judgment and thought content normal. Cognition and memory are normal.  Nursing note and vitals reviewed.   Results for orders placed or performed in visit on 10/04/15  CBC with Differential/Platelet  Result Value Ref Range   WBC 12.9 (H) 3.4 - 10.8 x10E3/uL   RBC 4.89 4.14 - 5.80 x10E6/uL   Hemoglobin 14.4 12.6 - 17.7 g/dL   Hematocrit 42.6 37.5 - 51.0 %   MCV 87 79 - 97 fL   MCH 29.4 26.6 - 33.0 pg   MCHC 33.8 31.5 - 35.7 g/dL   RDW 13.3 12.3 - 15.4 %   Platelets 424 (H) 150 - 379 x10E3/uL   Neutrophils 61 %   Lymphs 30 %   Monocytes 6 %   Eos 2 %   Basos 0 %   Neutrophils Absolute 8.0 (H) 1.4 - 7.0 x10E3/uL   Lymphocytes Absolute 3.8 (H) 0.7 - 3.1 x10E3/uL   Monocytes Absolute 0.8 0.1 - 0.9 x10E3/uL   EOS (ABSOLUTE) 0.3 0.0 - 0.4 x10E3/uL   Basophils Absolute 0.0 0.0 - 0.2 x10E3/uL    Immature Granulocytes 1 %   Immature Grans (Abs) 0.1 0.0 - 0.1 x10E3/uL  Comprehensive metabolic panel  Result Value Ref Range   Glucose 104 (H) 65 - 99 mg/dL   BUN 18 6 - 24 mg/dL   Creatinine, Ser 1.47 (H) 0.76 - 1.27 mg/dL   GFR calc non Af Amer 54 (L) >59 mL/min/1.73   GFR calc Af Amer 63 >59 mL/min/1.73   BUN/Creatinine Ratio 12 9 - 20   Sodium 141 134 - 144 mmol/L   Potassium 5.4 (H) 3.5 - 5.2 mmol/L   Chloride 98 96 - 106 mmol/L   CO2 21 18 - 29 mmol/L   Calcium 9.6 8.7 - 10.2 mg/dL   Total Protein 6.8 6.0 - 8.5 g/dL   Albumin 4.5 3.5 - 5.5 g/dL   Globulin, Total 2.3 1.5 - 4.5 g/dL   Albumin/Globulin Ratio 2.0 1.2 - 2.2   Bilirubin Total 0.3 0.0 - 1.2 mg/dL   Alkaline Phosphatase 79 39 - 117 IU/L   AST 11 0 - 40 IU/L   ALT 7 0 - 44 IU/L  Lipid Panel w/o Chol/HDL Ratio  Result Value Ref Range   Cholesterol, Total 169 100 - 199 mg/dL   Triglycerides 268 (  H) 0 - 149 mg/dL   HDL 35 (L) >39 mg/dL   VLDL Cholesterol Cal 54 (H) 5 - 40 mg/dL   LDL Calculated 80 0 - 99 mg/dL  Microalbumin, Urine Waived  Result Value Ref Range   Microalb, Ur Waived 10 0 - 19 mg/L   Creatinine, Urine Waived 200 10 - 300 mg/dL   Microalb/Creat Ratio <30 <30 mg/g  PSA  Result Value Ref Range   Prostate Specific Ag, Serum 1.9 0.0 - 4.0 ng/mL  UA/M w/rflx Culture, Routine  Result Value Ref Range   Specific Gravity, UA 1.020 1.005 - 1.030   pH, UA 5.0 5.0 - 7.5   Color, UA Yellow Yellow   Appearance Ur Clear Clear   Leukocytes, UA Negative Negative   Protein, UA Negative Negative/Trace   Glucose, UA Negative Negative   Ketones, UA Negative Negative   RBC, UA Negative Negative   Bilirubin, UA Negative Negative   Urobilinogen, Ur 0.2 0.2 - 1.0 mg/dL   Nitrite, UA Negative Negative      Assessment & Plan:   Problem List Items Addressed This Visit      Cardiovascular and Mediastinum   Essential hypertension    Stable, low end of normal. No orthostasis. Continue to monitor. Call with  any concerns.         Respiratory   OSA (obstructive sleep apnea)    Not on his CPAP. Will get him back into sleep medicine to get his CPAP set up.       Relevant Orders   Ambulatory referral to Sleep Studies     Digestive   Chronic pancreatitis ([place])    Encouraged increased fluids and clear liquid diet. Patient not visibly uncomfortable. Warning signs for which he should go to the ER discussed. Labs checked today. Recheck pending Labs.      Relevant Medications   HYDROcodone-acetaminophen (NORCO) 10-325 MG tablet   Other Relevant Orders   Comprehensive metabolic panel   Amylase   Lipase     Endocrine   Type 2 diabetes mellitus with hyperglycemia, without long-term current use of insulin ([facility]) - Primary    A1c down to 6.2. Continue current regimen. Continue to monitor. Recheck 6 months.       Relevant Orders   Bayer DCA Hb A1c Waived     Musculoskeletal and Integument   Degenerative arthritis of hip    To discuss with his orthopedist- missed last appointment. He will call them.      Relevant Medications   HYDROcodone-acetaminophen (NORCO) 10-325 MG tablet     Other   Insomnia   Relevant Orders   Ambulatory referral to Sleep Studies   Chronic pain    Will give him 1 more chance pending urine today. Will not put him on 28 day. Will need to come in monthly for Rx for a time. He will need to see pain management as we will only do this for 6 months. Call into pain management for them to call the patient again or call us so we can set up appointment.       Relevant Medications   HYDROcodone-acetaminophen (NORCO) 10-325 MG tablet   clonazePAM (KLONOPIN) 0.5 MG tablet   Controlled substance agreement signed    Will give him 1 more chance pending urine today. Will not put him on 28 day. Will need to come in monthly for Rx for a time. He will need to see pain management as we will only do this for 6  months.        Other Visit Diagnoses    Elevated WBC count         Rechecking CBC today.    Multiple pulmonary nodules        Needs follow up on nodules in his lungs. CT ordered today. Await results.     Relevant Orders    CT Chest W Contrast        Follow up plan: Return in about 4 weeks (around 01/13/2016).

## 2015-12-16 NOTE — Assessment & Plan Note (Signed)
Not on his CPAP. Will get him back into sleep medicine to get his CPAP set up.

## 2015-12-16 NOTE — Assessment & Plan Note (Signed)
Encouraged increased fluids and clear liquid diet. Patient not visibly uncomfortable. Warning signs for which he should go to the ER discussed. Labs checked today. Recheck pending Labs.

## 2015-12-17 ENCOUNTER — Telehealth: Payer: Self-pay | Admitting: Family Medicine

## 2015-12-17 DIAGNOSIS — K859 Acute pancreatitis, unspecified: Secondary | ICD-10-CM

## 2015-12-17 NOTE — Telephone Encounter (Signed)
Please let him know that his pancreas is still inflammed and I want him to come in next week to get the levels rechecked. Keep pushing fluids and not eating and if the pain gets worse he needs to go back to the hospital.

## 2015-12-17 NOTE — Telephone Encounter (Signed)
Spoke with patient, he states that he can not come in for 3 weeks because he is on the road. Told patient that if he gets bad again he needs to go back to the hospital.

## 2015-12-23 LAB — CBC WITH DIFFERENTIAL/PLATELET
BASOS ABS: 0 10*3/uL (ref 0.0–0.2)
BASOS: 0 %
EOS (ABSOLUTE): 0.2 10*3/uL (ref 0.0–0.4)
Eos: 1 %
HEMOGLOBIN: 14.5 g/dL (ref 12.6–17.7)
Hematocrit: 44.1 % (ref 37.5–51.0)
IMMATURE GRANS (ABS): 0 10*3/uL (ref 0.0–0.1)
Immature Granulocytes: 0 %
LYMPHS: 20 %
Lymphocytes Absolute: 2.8 10*3/uL (ref 0.7–3.1)
MCH: 28.1 pg (ref 26.6–33.0)
MCHC: 32.9 g/dL (ref 31.5–35.7)
MCV: 86 fL (ref 79–97)
MONOCYTES: 5 %
Monocytes Absolute: 0.7 10*3/uL (ref 0.1–0.9)
NEUTROS ABS: 10.6 10*3/uL — AB (ref 1.4–7.0)
Neutrophils: 74 %
Platelets: 331 10*3/uL (ref 150–379)
RBC: 5.16 x10E6/uL (ref 4.14–5.80)
RDW: 13.8 % (ref 12.3–15.4)
WBC: 14.3 10*3/uL — ABNORMAL HIGH (ref 3.4–10.8)

## 2015-12-23 LAB — DRUG SCREEN 764883 11+OXYCO+ALC+CRT-BUND
AMPHETAMINES, URINE: NEGATIVE ng/mL
BENZODIAZ UR QL: NEGATIVE ng/mL
Barbiturate: NEGATIVE ng/mL
Cannabinoid Quant, Ur: NEGATIVE ng/mL
Cocaine (Metabolite): NEGATIVE ng/mL
Creatinine: 69.1 mg/dL (ref 20.0–300.0)
ETHANOL: NEGATIVE %
MEPERIDINE: NEGATIVE ng/mL
Methadone Screen, Urine: NEGATIVE ng/mL
OXYCODONE+OXYMORPHONE UR QL SCN: NEGATIVE ng/mL
PHENCYCLIDINE: NEGATIVE ng/mL
PROPOXYPHENE: NEGATIVE ng/mL
TRAMADOL: NEGATIVE ng/mL
pH, Urine: 5.2 (ref 4.5–8.9)

## 2015-12-23 LAB — AMYLASE: Amylase: 180 U/L — ABNORMAL HIGH (ref 31–124)

## 2015-12-23 LAB — COMPREHENSIVE METABOLIC PANEL
A/G RATIO: 1.6 (ref 1.2–2.2)
ALBUMIN: 4.6 g/dL (ref 3.5–5.5)
ALT: 11 IU/L (ref 0–44)
AST: 18 IU/L (ref 0–40)
Alkaline Phosphatase: 81 IU/L (ref 39–117)
BUN / CREAT RATIO: 7 — AB (ref 9–20)
BUN: 8 mg/dL (ref 6–24)
Bilirubin Total: 0.7 mg/dL (ref 0.0–1.2)
CALCIUM: 9.9 mg/dL (ref 8.7–10.2)
CO2: 27 mmol/L (ref 18–29)
Chloride: 100 mmol/L (ref 96–106)
Creatinine, Ser: 1.1 mg/dL (ref 0.76–1.27)
GFR, EST AFRICAN AMERICAN: 88 mL/min/{1.73_m2} (ref >59)
GFR, EST NON AFRICAN AMERICAN: 76 mL/min/{1.73_m2} (ref >59)
Globulin, Total: 2.8 g/dL (ref 1.5–4.5)
Glucose: 117 mg/dL — ABNORMAL HIGH (ref 65–99)
POTASSIUM: 4.7 mmol/L (ref 3.5–5.2)
Sodium: 142 mmol/L (ref 134–144)
TOTAL PROTEIN: 7.4 g/dL (ref 6.0–8.5)

## 2015-12-23 LAB — OPIATES CONFIRMATION, URINE: Opiates: NEGATIVE ng/mL

## 2015-12-23 LAB — LIPASE: Lipase: 115 U/L — ABNORMAL HIGH (ref 0–59)

## 2015-12-27 ENCOUNTER — Ambulatory Visit: Payer: Commercial Managed Care - HMO

## 2016-01-11 ENCOUNTER — Ambulatory Visit: Payer: Commercial Managed Care - HMO

## 2016-01-11 DIAGNOSIS — M1712 Unilateral primary osteoarthritis, left knee: Secondary | ICD-10-CM | POA: Diagnosis not present

## 2016-01-12 ENCOUNTER — Ambulatory Visit
Admission: RE | Admit: 2016-01-12 | Discharge: 2016-01-12 | Disposition: A | Discharge status: Home / Self Care | Payer: Commercial Managed Care - HMO | Source: Ambulatory Visit | Attending: Family Medicine | Admitting: Family Medicine

## 2016-01-12 DIAGNOSIS — R918 Other nonspecific abnormal finding of lung field: Secondary | ICD-10-CM | POA: Insufficient documentation

## 2016-01-12 MED ORDER — IOPAMIDOL (ISOVUE-300) INJECTION 61%
75.0000 mL | Freq: Once | INTRAVENOUS | Status: AC | PRN
  Administered 2016-01-12: 75 mL via INTRAVENOUS

## 2016-01-13 ENCOUNTER — Encounter: Payer: Self-pay | Admitting: Family Medicine

## 2016-01-13 ENCOUNTER — Telehealth: Payer: Self-pay | Admitting: Family Medicine

## 2016-01-13 ENCOUNTER — Ambulatory Visit (INDEPENDENT_AMBULATORY_CARE_PROVIDER_SITE_OTHER): Payer: Commercial Managed Care - HMO | Admitting: Family Medicine

## 2016-01-13 VITALS — BP 136/85 | HR 86 | Temp 98.5°F | Ht 73.2 in | Wt 285.0 lb

## 2016-01-13 DIAGNOSIS — G4700 Insomnia, unspecified: Secondary | ICD-10-CM

## 2016-01-13 DIAGNOSIS — R918 Other nonspecific abnormal finding of lung field: Secondary | ICD-10-CM | POA: Insufficient documentation

## 2016-01-13 DIAGNOSIS — G4733 Obstructive sleep apnea (adult) (pediatric): Secondary | ICD-10-CM

## 2016-01-13 DIAGNOSIS — G8929 Other chronic pain: Secondary | ICD-10-CM

## 2016-01-13 DIAGNOSIS — M5416 Radiculopathy, lumbar region: Secondary | ICD-10-CM | POA: Diagnosis not present

## 2016-01-13 DIAGNOSIS — I2510 Atherosclerotic heart disease of native coronary artery without angina pectoris: Secondary | ICD-10-CM

## 2016-01-13 DIAGNOSIS — D3500 Benign neoplasm of unspecified adrenal gland: Secondary | ICD-10-CM

## 2016-01-13 DIAGNOSIS — K861 Other chronic pancreatitis: Secondary | ICD-10-CM

## 2016-01-13 DIAGNOSIS — M1611 Unilateral primary osteoarthritis, right hip: Secondary | ICD-10-CM | POA: Diagnosis not present

## 2016-01-13 MED ORDER — HYDROCODONE-ACETAMINOPHEN 10-325 MG PO TABS: 1.0000 | ORAL_TABLET | Freq: Four times a day (QID) | ORAL | 0 refills | Status: DC | PRN

## 2016-01-13 MED ORDER — CLONAZEPAM 0.5 MG PO TABS: ORAL_TABLET | ORAL | 0 refills | Status: DC

## 2016-01-13 NOTE — Assessment & Plan Note (Signed)
Needs to come in monthly for Rx for now. He will need to see pain management as we will only do this for another 5 months- he is aware.

## 2016-01-13 NOTE — Progress Notes (Signed)
BP 136/85 (BP Location: Left Arm, Patient Position: Sitting, Cuff Size: Large)   Pulse 86   Temp 98.5 F (36.9 C)   Ht 6' 1.2" (1.859 m)   Wt 285 lb (129.3 kg)   SpO2 97%   BMI 37.40 kg/m    Subjective:    Patient ID: Rickey Glover., male    DOB: Sep 09, 1960, 55 y.o.   MRN: [id number]  HPI: Rickey Glover. is a 55 y.o. male  Chief Complaint  Patient presents with  . Pain  . Insomnia   CHRONIC PAIN- got a cortisone shot in his L knee on Monday. His hip is really bothering him. He notes that he has not talked to them about his hip pain. His legs are now going numb again. Has not talked to his back doctor. He has not   Present dose: 40 Morphine equivalents Pain control status: exacerbated Duration: chronic Location: low back, R hip, bilateral knees Quality: dull, aching, sore and stabbing Current Pain Level: moderate Previous Pain Level: moderate Breakthrough pain: yes Benefit from narcotic medications: Unsure What Activities task can be accomplished with current medication?: Able to take care of self and work Interested in Careers adviser off narcotics:no  Stool softners/OTC fiber: no  Previous pain specialty evaluation: no Non-narcotic analgesic meds: yes- had been on gabapentin, didn't like it, made him sleepy.Weaned off last visit Narcotic contract: yes   INSOMNIA- medicine helping but only for 4 hours and then he's waking up.  Still not on his CPAP. Has not heard anything from sleep medicine.  Duration: chronic Satisfied with sleep quality: no Difficulty falling asleep: no Difficulty staying asleep: yes Waking a few hours after sleep onset: yes Early morning awakenings: no Daytime hypersomnolence: yes Wakes feeling refreshed: no Good sleep hygiene: no Apnea: yes- does not have his CPAP. Never got it. Hasn't gotten the titration. Snoring: yes Depressed/anxious mood: no Recent stress: no Restless legs/nocturnal leg cramps: no Chronic pain/arthritis:  yes History of sleep study: yes Treatments attempted: melatonin, uinsom, benadryl and ambien   Relevant past medical, surgical, family and social history reviewed and updated as indicated. Interim medical history since our last visit reviewed. Allergies and medications reviewed and updated.  Review of Systems  Constitutional: Negative.   Respiratory: Negative.   Cardiovascular: Negative.   Musculoskeletal: Positive for arthralgias, back pain, gait problem and myalgias. Negative for joint swelling, neck pain and neck stiffness.  Psychiatric/Behavioral: Positive for sleep disturbance. Negative for agitation, behavioral problems, confusion, decreased concentration, dysphoric mood, hallucinations, self-injury and suicidal ideas. The patient is not nervous/anxious and is not hyperactive.     Per HPI unless specifically indicated above     Objective:    BP 136/85 (BP Location: Left Arm, Patient Position: Sitting, Cuff Size: Large)   Pulse 86   Temp 98.5 F (36.9 C)   Ht 6' 1.2" (1.859 m)   Wt 285 lb (129.3 kg)   SpO2 97%   BMI 37.40 kg/m   Wt Readings from Last 3 Encounters:  01/13/16 285 lb (129.3 kg)  12/16/15 294 lb (133.4 kg)  10/22/15 289 lb (131.1 kg)    Physical Exam  Constitutional: He is oriented to person, place, and time. He appears well-developed and well-nourished. No distress.  HENT:  Head: Normocephalic and atraumatic.  Right Ear: Hearing normal.  Left Ear: Hearing normal.  Nose: Nose normal.  Eyes: Conjunctivae and lids are normal. Right eye exhibits no discharge. Left eye exhibits no discharge. No scleral icterus.  Cardiovascular: Normal  rate, regular rhythm, normal heart sounds and intact distal pulses.  Exam reveals no gallop and no friction rub.   No murmur heard. Pulmonary/Chest: Effort normal and breath sounds normal. No respiratory distress. He has no wheezes. He has no rales. He exhibits no tenderness.  Musculoskeletal: Normal range of motion.  Antalgic  gait  Neurological: He is alert and oriented to person, place, and time.  Skin: Skin is warm, dry and intact. No rash noted. No erythema. No pallor.  Psychiatric: He has a normal mood and affect. His speech is normal and behavior is normal. Judgment and thought content normal. Cognition and memory are normal.  Vitals reviewed.   Results for orders placed or performed in visit on 12/16/15  CBC with Differential/Platelet  Result Value Ref Range   WBC 14.3 (H) 3.4 - 10.8 x10E3/uL   RBC 5.16 4.14 - 5.80 x10E6/uL   Hemoglobin 14.5 12.6 - 17.7 g/dL   Hematocrit 44.1 37.5 - 51.0 %   MCV 86 79 - 97 fL   MCH 28.1 26.6 - 33.0 pg   MCHC 32.9 31.5 - 35.7 g/dL   RDW 13.8 12.3 - 15.4 %   Platelets 331 150 - 379 x10E3/uL   Neutrophils 74 %   Lymphs 20 %   Monocytes 5 %   Eos 1 %   Basos 0 %   Neutrophils Absolute 10.6 (H) 1.4 - 7.0 x10E3/uL   Lymphocytes Absolute 2.8 0.7 - 3.1 x10E3/uL   Monocytes Absolute 0.7 0.1 - 0.9 x10E3/uL   EOS (ABSOLUTE) 0.2 0.0 - 0.4 x10E3/uL   Basophils Absolute 0.0 0.0 - 0.2 x10E3/uL   Immature Granulocytes 0 %   Immature Grans (Abs) 0.0 0.0 - 0.1 x10E3/uL  [id number]+Oxyco+Alc+Crt-Bund  Result Value Ref Range   Ethanol Negative Cutoff=0.020 %   Amphetamines, Urine Negative Cutoff=1000 ng/mL   Barbiturate Negative Cutoff=200 ng/mL   BENZODIAZ UR QL Negative Cutoff=200 ng/mL   Cannabinoid Quant, Ur Negative Cutoff=50 ng/mL   Cocaine (Metabolite) Negative Cutoff=300 ng/mL   OPIATE SCREEN URINE See Final Results Cutoff=300 ng/mL   OXYCODONE+OXYMORPHONE UR QL SCN Negative Cutoff=300 ng/mL   Phencyclidine Negative Cutoff=25 ng/mL   Methadone Screen, Urine Negative Cutoff=300 ng/mL   Propoxyphene Negative Cutoff=300 ng/mL   Meperidine Negative Cutoff=200 ng/mL   Tramadol Negative Cutoff=200 ng/mL   Creatinine 69.1 20.0 - 300.0 mg/dL   PH OF URINE 5.2 4.5 - 8.9  Bayer DCA Hb A1c Waived  Result Value Ref Range   Bayer DCA Hb A1c Waived 6.2 <7.0 %  Comprehensive  metabolic panel  Result Value Ref Range   Glucose 117 (H) 65 - 99 mg/dL   BUN 8 6 - 24 mg/dL   Creatinine, Ser 1.10 0.76 - 1.27 mg/dL   GFR calc non Af Amer 76 >59 mL/min/1.73   GFR calc Af Amer 88 >59 mL/min/1.73   BUN/Creatinine Ratio 7 (L) 9 - 20   Sodium 142 134 - 144 mmol/L   Potassium 4.7 3.5 - 5.2 mmol/L   Chloride 100 96 - 106 mmol/L   CO2 27 18 - 29 mmol/L   Calcium 9.9 8.7 - 10.2 mg/dL   Total Protein 7.4 6.0 - 8.5 g/dL   Albumin 4.6 3.5 - 5.5 g/dL   Globulin, Total 2.8 1.5 - 4.5 g/dL   Albumin/Globulin Ratio 1.6 1.2 - 2.2   Bilirubin Total 0.7 0.0 - 1.2 mg/dL   Alkaline Phosphatase 81 39 - 117 IU/L   AST 18 0 - 40 IU/L   ALT  11 0 - 44 IU/L  Amylase  Result Value Ref Range   Amylase 180 (H) 31 - 124 U/L  Lipase  Result Value Ref Range   Lipase 115 (H) 0 - 59 U/L  Opiates Confirmation, Urine  Result Value Ref Range   Opiates Negative Cutoff=300 ng/mL      Assessment & Plan:   Problem List Items Addressed This Visit      Respiratory   OSA (obstructive sleep apnea)    Not on his CPAP. Unclear if he needs sleep study or titration or just order. Calling sleep medicine today. Will get him back into sleep medicine to get his CPAP set up.         Digestive   Chronic pancreatitis ([place]) - Primary   Relevant Medications   HYDROcodone-acetaminophen (NORCO) 10-325 MG tablet   Other Relevant Orders   Comprehensive metabolic panel   Amylase   Lipase     Nervous and Auditory   Lumbar radiculopathy    Bothering him again. Will call surgeon.      Relevant Medications   clonazePAM (KLONOPIN) 0.5 MG tablet     Musculoskeletal and Integument   Degenerative arthritis of hip    To discuss with his orthopedist- missed last appointment, still hasn't seen them. He will call them.      Relevant Medications   HYDROcodone-acetaminophen (NORCO) 10-325 MG tablet     Other   Insomnia    Not under good control. Rx did not last 2 months. Discussed. This RX must last 2  months. Needs OSA treated      Chronic pain    Needs to come in monthly for Rx for now. He will need to see pain management as we will only do this for another 5 months- he is aware.       Relevant Medications   clonazePAM (KLONOPIN) 0.5 MG tablet   HYDROcodone-acetaminophen (NORCO) 10-325 MG tablet    Other Visit Diagnoses   None.      Follow up plan: Return in about 4 weeks (around 02/10/2016).

## 2016-01-13 NOTE — Assessment & Plan Note (Signed)
Not under good control. Rx did not last 2 months. Discussed. This RX must last 2 months. Needs OSA treated

## 2016-01-13 NOTE — Assessment & Plan Note (Signed)
Bothering him again. Will call surgeon.

## 2016-01-13 NOTE — Telephone Encounter (Signed)
Please find out if he's had his CPAP titration and just needs an order for his CPAP or if he needs to have his CPAP titration done. He says he had a sleep study with [facility], but it may be an issue with coverage.   When you call him back, please let him know that his CT scan of his chest showed a bunch of little nodules, but none of them have changed and they are not worried about them. They are too small for them to really worry about and they think it's probably scarring from previous illnesses. However, we are going to repeat his CT scan in 6 months to make sure that none of them have changed.   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   He does, however, have calcium in the arteries of his heart which increase his risk of a heart attack. I'm going to refer him to cardiology to get this worked up to try to avoid a heart attack.  Thanks!

## 2016-01-13 NOTE — Assessment & Plan Note (Signed)
Not on his CPAP. Unclear if he needs sleep study or titration or just order. Calling sleep medicine today. Will get him back into sleep medicine to get his CPAP set up.

## 2016-01-13 NOTE — Patient Instructions (Addendum)
CALL Dr. Angie Fava about your hip and your back  CALL the pain management doctor for your appointment- we can only continue to your medicine for another 5 months before we won't be able to continue to prescribe it

## 2016-01-13 NOTE — Assessment & Plan Note (Signed)
To discuss with his orthopedist- missed last appointment, still hasn't seen them. He will call them.

## 2016-01-14 LAB — COMPREHENSIVE METABOLIC PANEL
A/G RATIO: 1.6 (ref 1.2–2.2)
ALK PHOS: 71 IU/L (ref 39–117)
ALT: 14 IU/L (ref 0–44)
AST: 20 IU/L (ref 0–40)
Albumin: 4.5 g/dL (ref 3.5–5.5)
BILIRUBIN TOTAL: 0.6 mg/dL (ref 0.0–1.2)
BUN/Creatinine Ratio: 17 (ref 9–20)
BUN: 17 mg/dL (ref 6–24)
CALCIUM: 9.9 mg/dL (ref 8.7–10.2)
CHLORIDE: 101 mmol/L (ref 96–106)
CO2: 24 mmol/L (ref 18–29)
Creatinine, Ser: 1.03 mg/dL (ref 0.76–1.27)
GFR calc Af Amer: 95 mL/min/{1.73_m2} (ref >59)
GFR, EST NON AFRICAN AMERICAN: 83 mL/min/{1.73_m2} (ref >59)
GLOBULIN, TOTAL: 2.9 g/dL (ref 1.5–4.5)
Glucose: 79 mg/dL (ref 65–99)
POTASSIUM: 4.8 mmol/L (ref 3.5–5.2)
SODIUM: 141 mmol/L (ref 134–144)
Total Protein: 7.4 g/dL (ref 6.0–8.5)

## 2016-01-14 LAB — LIPASE: LIPASE: 60 U/L — AB (ref 0–59)

## 2016-01-14 LAB — AMYLASE: Amylase: 132 U/L — ABNORMAL HIGH (ref 31–124)

## 2016-01-18 NOTE — Telephone Encounter (Signed)
Patient notified

## 2016-01-18 NOTE — Telephone Encounter (Signed)
Please let him know that his labs are better, but still not normal, and that we are going to get his CPAP ordered and they should be contacting him soon. Thanks!

## 2016-01-18 NOTE — Telephone Encounter (Signed)
Dr.Johnson, I notified patient of results.  I talked to [facility], they are out of network with his insurance, so they were not able to get the patient set up with a C-pap.

## 2016-01-26 ENCOUNTER — Ambulatory Visit: Payer: Commercial Managed Care - HMO | Admitting: Family Medicine

## 2016-01-31 ENCOUNTER — Telehealth: Payer: Self-pay

## 2016-01-31 NOTE — Telephone Encounter (Signed)
Patient called to advise that his insurance plan has provided the following companies for obtaining his C-PAP machineSales promotion account executive [employer]-- [phone]  [facility]-- [phone]

## 2016-02-03 ENCOUNTER — Ambulatory Visit: Payer: Commercial Managed Care - HMO | Admitting: Family Medicine

## 2016-02-03 NOTE — Telephone Encounter (Signed)
Dr.Johnson, can you please write and order for the patients C-Pap machine.

## 2016-02-03 NOTE — Telephone Encounter (Signed)
Rx written.

## 2016-02-08 ENCOUNTER — Ambulatory Visit: Payer: Commercial Managed Care - HMO | Admitting: Family Medicine

## 2016-02-09 ENCOUNTER — Encounter: Payer: Self-pay | Admitting: Family Medicine

## 2016-02-09 ENCOUNTER — Ambulatory Visit (INDEPENDENT_AMBULATORY_CARE_PROVIDER_SITE_OTHER): Payer: Commercial Managed Care - HMO | Admitting: Family Medicine

## 2016-02-09 VITALS — BP 118/71 | HR 95 | Temp 98.8°F | Wt 282.0 lb

## 2016-02-09 DIAGNOSIS — G4733 Obstructive sleep apnea (adult) (pediatric): Secondary | ICD-10-CM | POA: Diagnosis not present

## 2016-02-09 DIAGNOSIS — G8929 Other chronic pain: Secondary | ICD-10-CM | POA: Diagnosis not present

## 2016-02-09 DIAGNOSIS — G4700 Insomnia, unspecified: Secondary | ICD-10-CM

## 2016-02-09 DIAGNOSIS — M5416 Radiculopathy, lumbar region: Secondary | ICD-10-CM | POA: Diagnosis not present

## 2016-02-09 MED ORDER — HYDROCODONE-ACETAMINOPHEN 10-325 MG PO TABS: 1.0000 | ORAL_TABLET | Freq: Four times a day (QID) | ORAL | 0 refills | Status: DC | PRN

## 2016-02-09 NOTE — Assessment & Plan Note (Signed)
Not on his CPAP. CPAP Rx faxed last week. They should be contacting him shortly.

## 2016-02-09 NOTE — Progress Notes (Deleted)
Cardiology Office Note   Date:  02/09/2016   ID:  Rickey Gottron., DOB Jan 31, 1961, MRN [id number]  Referring Doctor:  Park Liter, DO   Cardiologist:   Wende Bushy, MD   Reason for consultation:  No chief complaint on file.     History of Present Illness: Rickey Wojick. is a 55 y.o. male who presents for ***   ROS:  Please see the history of present illness. Aside from mentioned under HPI, all other systems are reviewed and negative.     Past Medical History:  Diagnosis Date  . Chronic back pain   . DDD (degenerative disc disease), lumbar   . Diabetes mellitus without complication ([place])   . Hip pain   . Hyperlipidemia   . Hypertension   . Insomnia   . Knee pain, left   . LAP-BAND surgery status   . Sciatica of right side   . Sleep apnea    new DX - has not been for CPAP titration yet  . Vitamin D deficiency     Past Surgical History:  Procedure Laterality Date  . COLONOSCOPY WITH PROPOFOL N/A 07/15/2015   Procedure: COLONOSCOPY WITH PROPOFOL;  Surgeon: Lucilla Lame, MD;  Location: [facility];  Service: Endoscopy;  Laterality: N/A;  Diabetic - oral meds Sleep apnea  . lap band surgery    . LUMBAR SPINE SURGERY  April 2017     reports that he has been smoking Cigarettes.  He has a 20.00 pack-year smoking history. He has never used smokeless tobacco. He reports that he does not drink alcohol or use drugs.   family history includes Aneurysm in his mother; Stroke in his maternal grandmother and paternal grandmother.   Outpatient Medications Prior to Visit  Medication Sig Dispense Refill  . amLODipine (NORVASC) 10 MG tablet Take 10 mg by mouth daily.    Marland Kitchen aspirin 81 MG tablet Take 81 mg by mouth daily.    . clonazePAM (KLONOPIN) 0.5 MG tablet 1 tablet at bedtime as needed (try every other day at least) 30 tablet 0  . clopidogrel (PLAVIX) 75 MG tablet Take 1 tablet (75 mg total) by mouth daily. 90 tablet 3  . furosemide (LASIX) 40 MG  tablet Take 40 mg by mouth daily.    Marland Kitchen glimepiride (AMARYL) 4 MG tablet Take 4 mg by mouth 2 (two) times daily.    Marland Kitchen HYDROcodone-acetaminophen (NORCO) 10-325 MG tablet Take 1 tablet by mouth every 6 (six) hours as needed. 100 tablet 0  . metFORMIN (GLUCOPHAGE-XR) 500 MG 24 hr tablet TAKE 2 TABLETS BY MOUTH TWICE DAILY WITH MEALS 120 tablet 6  . quinapril (ACCUPRIL) 40 MG tablet TAKE 1 TABLET BY MOUTH DAILY 90 tablet 0  . spironolactone (ALDACTONE) 25 MG tablet Take 1 tablet (25 mg total) by mouth once. 30 tablet 6   No facility-administered medications prior to visit.      Allergies: Percocet [oxycodone-acetaminophen]    PHYSICAL EXAM: VS:  There were no vitals taken for this visit. , There is no height or weight on file to calculate BMI. Wt Readings from Last 3 Encounters:  02/09/16 282 lb (127.9 kg)  01/13/16 285 lb (129.3 kg)  12/16/15 294 lb (133.4 kg)    GENERAL:  well developed, well nourished, *** obese, not in acute distress HEENT: normocephalic, pink conjunctivae, anicteric sclerae, no xanthelasma, normal dentition, oropharynx clear NECK:  no neck vein engorgement, JVP normal, no hepatojugular reflux, carotid upstroke brisk and symmetric, no bruit,  no thyromegaly, no lymphadenopathy LUNGS:  good respiratory effort, clear to auscultation bilaterally CV:  PMI not displaced, no thrills, no lifts, S1 and S2 within normal limits, no palpable S3 or S4, no murmurs, no rubs, no gallops ABD:  Soft, nontender, nondistended, normoactive bowel sounds, no abdominal aortic bruit, no hepatomegaly, no splenomegaly MS: nontender back, no kyphosis, no scoliosis, no joint deformities EXT:  2+ DP/PT pulses, no edema, no varicosities, no cyanosis, no clubbing SKIN: warm, nondiaphoretic, normal turgor, no ulcers NEUROPSYCH: alert, oriented to person, place, and time, sensory/motor grossly intact, normal mood, appropriate affect  Recent Labs: 04/27/2015: TSH 0.932 12/16/2015: Platelets  331 01/13/2016: ALT 14; BUN 17; Creatinine, Ser 1.03; Potassium 4.8; Sodium 141   Lipid Panel    Component Value Date/Time   CHOL 169 10/04/2015 1409   TRIG 268 (H) 10/04/2015 1409   HDL 35 (L) 10/04/2015 1409   LDLCALC 80 10/04/2015 1409     Other studies Reviewed:  EKG:  The ekg from *** was personally reviewed by me and it revealed ***  Additional studies/ records that were reviewed personally reviewed by me today include: ***   ASSESSMENT AND PLAN:    Current medicines are reviewed at length with the patient today.  The patient {ACTIONS; HAS/DOES NOT HAVE:19233} concerns regarding medicines.  Labs/ tests ordered today include: No orders of the defined types were placed in this encounter.   I had a lengthy and detailed discussion with the patient regarding diagnoses, prognosis, diagnostic options, treatment options ***, and side effects of medications.   I counseled the patient on importance of lifestyle modification including heart healthy diet, regular physical activity *** , and smoking cessation.   Disposition:   FU with undersigned after tests ***   Signed, Wende Bushy, MD  02/09/2016 4:20 PM    [place]  This note was generated in part with voice recognition software and I apologize for any typographical errors that were not detected and corrected.

## 2016-02-09 NOTE — Assessment & Plan Note (Signed)
Not under good control. This RX must last 2 months. Needs OSA treated. EXPRESSLY told him that he cannot take his klonopin and benadryl at the same time

## 2016-02-09 NOTE — Assessment & Plan Note (Signed)
Needs to come in monthly for Rx for now. He will need to see pain management as we will only do this for another 4 months- he is aware.

## 2016-02-09 NOTE — Assessment & Plan Note (Signed)
Still bothering him. Will call surgeon.

## 2016-02-09 NOTE — Progress Notes (Signed)
BP 118/71 (BP Location: Left Arm, Patient Position: Sitting, Cuff Size: Large)   Pulse 95   Temp 98.8 F (37.1 C)   Wt 282 lb (127.9 kg)   SpO2 96%   BMI 37.00 kg/m    Subjective:    Patient ID: Rickey Gottron., male    DOB: 1961-04-13, 55 y.o.   MRN: [id number]  HPI: Rickey Brackeen. is a 55 y.o. male  Chief Complaint  Patient presents with  . Pain   CHRONIC PAIN-His knee is doing better. He still having lumbar radiculopathy. Trying to get in to see his orthopedist this week.  Present dose: 40 Morphine equivalents Pain control status: exacerbated Duration: chronic Location: low back, R hip, bilateral knees Quality: dull, aching, sore and stabbing Current Pain Level: moderate Previous Pain Level: moderate Breakthrough pain: yes Benefit from narcotic medications: Unsure What Activities task can be accomplished with current medication?: Able to take care of self and work Interested in Careers adviser off narcotics:no  Stool softners/OTC fiber: no  Previous pain specialty evaluation: no Non-narcotic analgesic meds: yes- had been on gabapentin, didn't like it, made him sleepy.Weaned off at a previous visit Narcotic contract: yes   INSOMNIA- Has not gotten his CPAP yet. Still waking up after 4 hours. Has been taking benadryl with his klonopin.  Duration:chronic Satisfied with sleep quality: no Difficulty falling asleep: no Difficulty staying asleep: yes Waking a few hours after sleep onset: yes Early morning awakenings: no Daytime hypersomnolence: yes Wakes feeling refreshed: no Good sleep hygiene: no Apnea: yes-  Snoring: yes Depressed/anxious mood: no Recent stress: no Restless legs/nocturnal leg cramps: no Chronic pain/arthritis:yes History of sleep study: yes Treatments attempted: melatonin, uinsom, benadryl and ambien  Relevant past medical, surgical, family and social history reviewed and updated as indicated. Interim medical history since our  last visit reviewed. Allergies and medications reviewed and updated.  Review of Systems  Constitutional: Negative.   Respiratory: Negative.   Cardiovascular: Negative.   Musculoskeletal: Positive for arthralgias, back pain and myalgias. Negative for gait problem, joint swelling, neck pain and neck stiffness.  Psychiatric/Behavioral: Positive for sleep disturbance. Negative for agitation, behavioral problems, confusion, decreased concentration, dysphoric mood, hallucinations, self-injury and suicidal ideas. The patient is not nervous/anxious and is not hyperactive.     Per HPI unless specifically indicated above     Objective:    BP 118/71 (BP Location: Left Arm, Patient Position: Sitting, Cuff Size: Large)   Pulse 95   Temp 98.8 F (37.1 C)   Wt 282 lb (127.9 kg)   SpO2 96%   BMI 37.00 kg/m   Wt Readings from Last 3 Encounters:  02/09/16 282 lb (127.9 kg)  01/13/16 285 lb (129.3 kg)  12/16/15 294 lb (133.4 kg)    Physical Exam  Constitutional: He is oriented to person, place, and time. He appears well-developed and well-nourished. No distress.  HENT:  Head: Normocephalic and atraumatic.  Right Ear: Hearing normal.  Left Ear: Hearing normal.  Nose: Nose normal.  Eyes: Conjunctivae and lids are normal. Right eye exhibits no discharge. Left eye exhibits no discharge. No scleral icterus.  Cardiovascular: Normal rate, regular rhythm, normal heart sounds and intact distal pulses.  Exam reveals no gallop and no friction rub.   No murmur heard. Pulmonary/Chest: Effort normal and breath sounds normal. No respiratory distress. He has no wheezes. He has no rales. He exhibits no tenderness.  Musculoskeletal: Normal range of motion.  Neurological: He is alert and oriented to person, place, and  time.  Skin: Skin is warm, dry and intact. No rash noted. No erythema. No pallor.  Psychiatric: He has a normal mood and affect. His speech is normal and behavior is normal. Judgment and thought  content normal. Cognition and memory are normal.  Nursing note and vitals reviewed.   Results for orders placed or performed in visit on 01/13/16  Comprehensive metabolic panel  Result Value Ref Range   Glucose 79 65 - 99 mg/dL   BUN 17 6 - 24 mg/dL   Creatinine, Ser 1.03 0.76 - 1.27 mg/dL   GFR calc non Af Amer 83 >59 mL/min/1.73   GFR calc Af Amer 95 >59 mL/min/1.73   BUN/Creatinine Ratio 17 9 - 20   Sodium 141 134 - 144 mmol/L   Potassium 4.8 3.5 - 5.2 mmol/L   Chloride 101 96 - 106 mmol/L   CO2 24 18 - 29 mmol/L   Calcium 9.9 8.7 - 10.2 mg/dL   Total Protein 7.4 6.0 - 8.5 g/dL   Albumin 4.5 3.5 - 5.5 g/dL   Globulin, Total 2.9 1.5 - 4.5 g/dL   Albumin/Globulin Ratio 1.6 1.2 - 2.2   Bilirubin Total 0.6 0.0 - 1.2 mg/dL   Alkaline Phosphatase 71 39 - 117 IU/L   AST 20 0 - 40 IU/L   ALT 14 0 - 44 IU/L  Amylase  Result Value Ref Range   Amylase 132 (H) 31 - 124 U/L  Lipase  Result Value Ref Range   Lipase 60 (H) 0 - 59 U/L      Assessment & Plan:   Problem List Items Addressed This Visit      Respiratory   OSA (obstructive sleep apnea) - Primary    Not on his CPAP. CPAP Rx faxed last week. They should be contacting him shortly.        Nervous and Auditory   Lumbar radiculopathy    Still bothering him. Will call surgeon.        Other   Insomnia    Not under good control. This RX must last 2 months. Needs OSA treated. EXPRESSLY told him that he cannot take his klonopin and benadryl at the same time      Chronic pain    Needs to come in monthly for Rx for now. He will need to see pain management as we will only do this for another 4 months- he is aware.       Relevant Medications   HYDROcodone-acetaminophen (NORCO) 10-325 MG tablet    Other Visit Diagnoses   None.      Follow up plan: Return in about 4 weeks (around 03/08/2016) for Follow up pain.

## 2016-02-10 ENCOUNTER — Encounter: Payer: Self-pay | Admitting: *Deleted

## 2016-02-10 ENCOUNTER — Ambulatory Visit: Payer: Self-pay | Admitting: Cardiology

## 2016-03-05 ENCOUNTER — Other Ambulatory Visit: Payer: Self-pay | Admitting: Family Medicine

## 2016-03-06 ENCOUNTER — Telehealth: Payer: Self-pay | Admitting: Family Medicine

## 2016-03-06 ENCOUNTER — Telehealth: Payer: Self-pay

## 2016-03-06 MED ORDER — GLIMEPIRIDE 4 MG PO TABS: 4.0000 mg | ORAL_TABLET | Freq: Two times a day (BID) | ORAL | 1 refills | Status: DC

## 2016-03-06 MED ORDER — AMLODIPINE BESYLATE 10 MG PO TABS: 10.0000 mg | ORAL_TABLET | Freq: Every day | ORAL | 1 refills | Status: DC

## 2016-03-06 MED ORDER — METFORMIN HCL ER 500 MG PO TB24: 1000.0000 mg | ORAL_TABLET | Freq: Two times a day (BID) | ORAL | 1 refills | Status: DC

## 2016-03-06 NOTE — Telephone Encounter (Signed)
That is the dose I have in the computer- can we check with the patient and see what he was actually taking?

## 2016-03-06 NOTE — Telephone Encounter (Signed)
Patient states that he is taking two in the morning and two at night. Patient he going to switch back to one twice a day.  Please send in another prescription that states take one in the morning and one at night

## 2016-03-06 NOTE — Telephone Encounter (Signed)
Refills sent to his pharmacy. 

## 2016-03-06 NOTE — Telephone Encounter (Signed)
He should stay on his 2 pills BID until his next A1c check

## 2016-03-06 NOTE — Telephone Encounter (Signed)
Called and left a message for patient to return my call.  Pharmacy notified of the correct dose.

## 2016-03-06 NOTE — Telephone Encounter (Signed)
It looks like Dr.Crissman refill the Quinapril on the 17th.

## 2016-03-06 NOTE — Telephone Encounter (Signed)
Pt called stated he needs a refill on the following:  Glimeapiride Amlodipine Quanipril Metformin (pt is almost)   Pharm is [facility] in [place].  Thanks.

## 2016-03-06 NOTE — Telephone Encounter (Signed)
Pharmacy called to verify patients metformin er 500mg  dose. They said that he has been on 1 tablet BID, the new prescription is for Metformin ER 500mg  2 tablet BID.  What one is correct?

## 2016-03-07 NOTE — Telephone Encounter (Signed)
Patient notified
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
## 2016-03-16 ENCOUNTER — Ambulatory Visit (INDEPENDENT_AMBULATORY_CARE_PROVIDER_SITE_OTHER): Payer: Commercial Managed Care - HMO | Admitting: Family Medicine

## 2016-03-16 ENCOUNTER — Ambulatory Visit (INDEPENDENT_AMBULATORY_CARE_PROVIDER_SITE_OTHER): Payer: Commercial Managed Care - HMO | Admitting: Podiatry

## 2016-03-16 ENCOUNTER — Encounter: Payer: Self-pay | Admitting: Podiatry

## 2016-03-16 ENCOUNTER — Encounter: Payer: Self-pay | Admitting: Family Medicine

## 2016-03-16 VITALS — BP 123/79 | HR 88 | Temp 99.0°F | Wt 281.0 lb

## 2016-03-16 DIAGNOSIS — M79671 Pain in right foot: Secondary | ICD-10-CM | POA: Diagnosis not present

## 2016-03-16 DIAGNOSIS — M79672 Pain in left foot: Secondary | ICD-10-CM | POA: Diagnosis not present

## 2016-03-16 DIAGNOSIS — G453 Amaurosis fugax: Secondary | ICD-10-CM

## 2016-03-16 DIAGNOSIS — G8929 Other chronic pain: Secondary | ICD-10-CM

## 2016-03-16 DIAGNOSIS — Z23 Encounter for immunization: Secondary | ICD-10-CM

## 2016-03-16 DIAGNOSIS — E1165 Type 2 diabetes mellitus with hyperglycemia: Secondary | ICD-10-CM | POA: Diagnosis not present

## 2016-03-16 DIAGNOSIS — B351 Tinea unguium: Secondary | ICD-10-CM | POA: Diagnosis not present

## 2016-03-16 DIAGNOSIS — E1142 Type 2 diabetes mellitus with diabetic polyneuropathy: Secondary | ICD-10-CM

## 2016-03-16 DIAGNOSIS — G4700 Insomnia, unspecified: Secondary | ICD-10-CM | POA: Diagnosis not present

## 2016-03-16 DIAGNOSIS — M5136 Other intervertebral disc degeneration, lumbar region: Secondary | ICD-10-CM

## 2016-03-16 DIAGNOSIS — E785 Hyperlipidemia, unspecified: Secondary | ICD-10-CM

## 2016-03-16 DIAGNOSIS — I10 Essential (primary) hypertension: Secondary | ICD-10-CM

## 2016-03-16 LAB — LIPID PANEL PICCOLO, WAIVED
Chol/HDL Ratio Piccolo,Waive: 4 mg/dL
Cholesterol Piccolo, Waived: 172 mg/dL (ref <200)
HDL CHOL PICCOLO, WAIVED: 43 mg/dL — AB (ref >59)
LDL CHOL CALC PICCOLO WAIVED: 95 mg/dL (ref <100)
TRIGLYCERIDES PICCOLO,WAIVED: 167 mg/dL — AB (ref <150)
VLDL CHOL CALC PICCOLO,WAIVE: 33 mg/dL — AB (ref <30)

## 2016-03-16 LAB — BAYER DCA HB A1C WAIVED: HB A1C (BAYER DCA - WAIVED): 6.4 % (ref <7.0)

## 2016-03-16 LAB — HEMOGLOBIN A1C: HEMOGLOBIN A1C: 6.4

## 2016-03-16 LAB — MICROALBUMIN, URINE WAIVED
Creatinine, Urine Waived: 300 mg/dL (ref 10–300)
Microalb, Ur Waived: 30 mg/L — ABNORMAL HIGH (ref 0–19)
Microalb/Creat Ratio: <30 mg/g (ref <30)

## 2016-03-16 MED ORDER — SPIRONOLACTONE 25 MG PO TABS: 25.0000 mg | ORAL_TABLET | Freq: Every day | ORAL | 1 refills | Status: DC

## 2016-03-16 MED ORDER — METFORMIN HCL ER 500 MG PO TB24: 500.0000 mg | ORAL_TABLET | Freq: Two times a day (BID) | ORAL | 1 refills | Status: DC

## 2016-03-16 MED ORDER — GLIMEPIRIDE 4 MG PO TABS: 4.0000 mg | ORAL_TABLET | Freq: Two times a day (BID) | ORAL | 1 refills | Status: DC

## 2016-03-16 MED ORDER — CLOPIDOGREL BISULFATE 75 MG PO TABS: 75.0000 mg | ORAL_TABLET | Freq: Every day | ORAL | 3 refills | Status: DC

## 2016-03-16 MED ORDER — CLONAZEPAM 0.5 MG PO TABS: ORAL_TABLET | ORAL | 0 refills | Status: DC

## 2016-03-16 MED ORDER — HYDROCODONE-ACETAMINOPHEN 10-325 MG PO TABS: 1.0000 | ORAL_TABLET | Freq: Four times a day (QID) | ORAL | 0 refills | Status: DC | PRN

## 2016-03-16 MED ORDER — QUINAPRIL HCL 40 MG PO TABS: 40.0000 mg | ORAL_TABLET | Freq: Every day | ORAL | 1 refills | Status: DC

## 2016-03-16 MED ORDER — METFORMIN HCL ER 500 MG PO TB24: 1000.0000 mg | ORAL_TABLET | Freq: Two times a day (BID) | ORAL | 1 refills | Status: DC

## 2016-03-16 NOTE — Progress Notes (Signed)
BP 123/79   Pulse 88   Temp 99 F (37.2 C)   Wt 281 lb (127.5 kg)   SpO2 99%   BMI 36.87 kg/m    Subjective:    Patient ID: Rickey Glover., male    DOB: 01/29/1961, 55 y.o.   MRN: [id number]  HPI: Rickey Glover. is a 55 y.o. male  Chief Complaint  Patient presents with  . Diabetes    follow up  . Hyperlipidemia    follow up  . Medication Refill    He needs a refill on Clonazepam and Hydrocodone.  . Hypertension    follow up   HYPERTENSION / HYPERLIPIDEMIA Satisfied with current treatment? yes Duration of hypertension: chronic BP monitoring frequency: not checking BP medication side effects: no Duration of hyperlipidemia: chronic Cholesterol medication side effects: no Cholesterol supplements: none Past cholesterol medications: atorvastain (lipitor) Medication compliance: ran out of his medicine while he was at work, and just restarted them last night Aspirin: yes Recent stressors: yes Recurrent headaches: no Visual changes: no Palpitations: no Dyspnea: no Chest pain: no Lower extremity edema: no Dizzy/lightheaded: no  DIABETES Hypoglycemic episodes:no Polydipsia/polyuria: no Visual disturbance: no Chest pain: no Paresthesias: no Glucose Monitoring: no  Accucheck frequency: Not Checking Taking Insulin?: yes Blood Pressure Monitoring: not checking Retinal Examination: Up to Date Foot Exam: Up to Date Diabetic Education: Completed Pneumovax: Up to Date Influenza: Up to Date Aspirin: no  CHRONIC PAIN- Seeing Dr. Angie Fava next week, has an appointment to see the pain clinic scheduled.  Present dose: 40 Morphine equivalents Pain control status: exacerbated Duration: chronic Location: low back, R hip, bilateral knees Quality: dull, aching, sore and stabbing Current Pain Level: moderate Previous Pain Level: moderate Breakthrough pain: yes Benefit from narcotic medications: Unsure What Activities task can be accomplished with current  medication?: Able to take care of self and work Interested in Careers adviser off narcotics:no  Stool softners/OTC fiber: no  Previous pain specialty evaluation: no Non-narcotic analgesic meds: yes- had been on gabapentin, didn't like it, made him sleepy.Weaned off at a previous visit Narcotic contract: yes   INSOMNIA- Has not gotten his CPAP yet. Still waking up after 4 hours.   Duration:chronic Satisfied with sleep quality: no Difficulty falling asleep: no Difficulty staying asleep: yes Waking a few hours after sleep onset: yes Early morning awakenings: no Daytime hypersomnolence: yes Wakes feeling refreshed: no Good sleep hygiene: no Apnea: yes- hasn't gotten his CPAP yet Snoring: yes Depressed/anxious mood: no Recent stress: no Restless legs/nocturnal leg cramps: no Chronic pain/arthritis:yes History of sleep study: yes Treatments attempted: melatonin, uinsom, benadryl and ambien  Relevant past medical, surgical, family and social history reviewed and updated as indicated. Interim medical history since our last visit reviewed. Allergies and medications reviewed and updated.  Review of Systems  Constitutional: Negative.   Respiratory: Negative.   Cardiovascular: Negative.   Psychiatric/Behavioral: Negative.     Per HPI unless specifically indicated above     Objective:    BP 123/79   Pulse 88   Temp 99 F (37.2 C)   Wt 281 lb (127.5 kg)   SpO2 99%   BMI 36.87 kg/m   Wt Readings from Last 3 Encounters:  03/16/16 281 lb (127.5 kg)  02/09/16 282 lb (127.9 kg)  01/13/16 285 lb (129.3 kg)    Physical Exam  Constitutional: He is oriented to person, place, and time. He appears well-developed and well-nourished. No distress.  HENT:  Head: Normocephalic and atraumatic.  Right Ear:  Hearing normal.  Left Ear: Hearing normal.  Nose: Nose normal.  Eyes: Conjunctivae and lids are normal. Right eye exhibits no discharge. Left eye exhibits no discharge. No scleral  icterus.  Cardiovascular: Normal rate, regular rhythm, normal heart sounds and intact distal pulses.  Exam reveals no gallop and no friction rub.   No murmur heard. Pulmonary/Chest: Effort normal and breath sounds normal. No respiratory distress. He has no wheezes. He has no rales. He exhibits no tenderness.  Musculoskeletal: Normal range of motion.  Neurological: He is alert and oriented to person, place, and time.  Skin: Skin is warm, dry and intact. No rash noted. No erythema. No pallor.  Psychiatric: He has a normal mood and affect. His speech is normal and behavior is normal. Judgment and thought content normal. Cognition and memory are normal.  Nursing note and vitals reviewed.   Results for orders placed or performed in visit on 03/16/16  Hemoglobin A1c  Result Value Ref Range   Hemoglobin A1C 6.4       Assessment & Plan:   Problem List Items Addressed This Visit      Cardiovascular and Mediastinum   Essential hypertension    Under good control. Continue current regimen. Continue to monitor. Recheck 6 months.       Relevant Medications   spironolactone (ALDACTONE) 25 MG tablet   quinapril (ACCUPRIL) 40 MG tablet   Other Relevant Orders   Comprehensive metabolic panel   Microalbumin, Urine Waived   Amaurosis fugax of left eye   Relevant Medications   spironolactone (ALDACTONE) 25 MG tablet   quinapril (ACCUPRIL) 40 MG tablet   clopidogrel (PLAVIX) 75 MG tablet     Endocrine   Type 2 diabetes mellitus with hyperglycemia, without long-term current use of insulin ([place]) - Primary    Slightly elevated because he was taking less meds. Will cut him down to 1 tab BID and recheck in 3 months.       Relevant Medications   quinapril (ACCUPRIL) 40 MG tablet   glimepiride (AMARYL) 4 MG tablet   metFORMIN (GLUCOPHAGE-XR) 500 MG 24 hr tablet   Other Relevant Orders   Bayer DCA Hb A1c Waived   Comprehensive metabolic panel   Microalbumin, Urine Waived     Musculoskeletal and  Integument   DDD (degenerative disc disease), lumbar    Still bothering him. To see surgeon next week. Appointment scheduled with pain center.      Relevant Medications   HYDROcodone-acetaminophen (NORCO) 10-325 MG tablet     Other   HLD (hyperlipidemia)    Slightly elevated because off his medicine for about a week. Restart medicine. Recheck 6 months.       Relevant Medications   spironolactone (ALDACTONE) 25 MG tablet   quinapril (ACCUPRIL) 40 MG tablet   Other Relevant Orders   Comprehensive metabolic panel   Lipid Panel [place], Waived   Insomnia    Not on his CPAP. Needs to contact them to get CPAP. States that he will do this now that he's not driving long distances      Chronic pain    Needs to come in monthly for Rx for now. He will need to see pain management as we will only do this for another 3 months- he is aware.       Relevant Medications   clonazePAM (KLONOPIN) 0.5 MG tablet   HYDROcodone-acetaminophen (NORCO) 10-325 MG tablet    Other Visit Diagnoses    Needs flu shot  Flu shot given today.   Encounter for immunization       Relevant Orders   Flu Vaccine QUAD 36+ mos IM (Completed)       Follow up plan: Return in about 4 weeks (around 04/13/2016).

## 2016-03-16 NOTE — Progress Notes (Signed)
Subjective: 55 y.o. returns the office today for painful, elongated, thickened toenails which he cannot trim himself. Denies any redness or drainage around the nails. Also inquiring about possibly removing the second toenail on the left foot but he wishes to hold off if possible. Denies any acute changes since last appointment and no new complaints today. Denies any systemic complaints such as fevers, chills, nausea, vomiting.   Objective: AAO 3, NAD DP/PT pulses palpable however PT 1/4, CRT less than 3 seconds; chronic ankle edema is present bilaterally. Nails hypertrophic, dystrophic, elongated, brittle, discolored 10. There is tenderness overlying the nails 1-5 bilaterally. There is no surrounding erythema or drainage along the nail sites. No open lesions or pre-ulcerative lesions are identified. No other areas of tenderness bilateral lower extremities. No overlying edema, erythema, increased warmth. No pain with calf compression, swelling, warmth, erythema.  Assessment: Patient presents with symptomatic onychomycosis  Plan: -Treatment options including alternatives, risks, complications were discussed -Nails sharply debrided 10 without complication/bleeding.  -Discussed the nail removal however debrided nail and he would like to continue with this. Acid prescribed a urea cream. -Discussed daily foot inspection. If there are any changes, to call the office immediately.  -Follow-up in 3 months or sooner if any problems are to arise. In the meantime, encouraged to call the office with any questions, concerns, changes symptoms.  Celesta Gentile, DPM

## 2016-03-16 NOTE — Assessment & Plan Note (Signed)
Needs to come in monthly for Rx for now. He will need to see pain management as we will only do this for another 3 months- he is aware.

## 2016-03-16 NOTE — Assessment & Plan Note (Signed)
Not on his CPAP. Needs to contact them to get CPAP. States that he will do this now that he's not driving long distances

## 2016-03-16 NOTE — Assessment & Plan Note (Signed)
Slightly elevated because he was taking less meds. Will cut him down to 1 tab BID and recheck in 3 months.

## 2016-03-16 NOTE — Assessment & Plan Note (Signed)
Still bothering him. To see surgeon next week. Appointment scheduled with pain center.

## 2016-03-16 NOTE — Assessment & Plan Note (Signed)
Slightly elevated because off his medicine for about a week. Restart medicine. Recheck 6 months.

## 2016-03-16 NOTE — Assessment & Plan Note (Signed)
Under good control. Continue current regimen. Continue to monitor. Recheck 6 months.  

## 2016-03-17 ENCOUNTER — Encounter: Payer: Self-pay | Admitting: Family Medicine

## 2016-03-17 LAB — COMPREHENSIVE METABOLIC PANEL
ALK PHOS: 75 IU/L (ref 39–117)
ALT: 13 IU/L (ref 0–44)
AST: 14 IU/L (ref 0–40)
Albumin/Globulin Ratio: 1.6 (ref 1.2–2.2)
Albumin: 4.2 g/dL (ref 3.5–5.5)
BILIRUBIN TOTAL: 0.4 mg/dL (ref 0.0–1.2)
BUN/Creatinine Ratio: 11 (ref 9–20)
BUN: 13 mg/dL (ref 6–24)
CHLORIDE: 102 mmol/L (ref 96–106)
CO2: 25 mmol/L (ref 18–29)
CREATININE: 1.17 mg/dL (ref 0.76–1.27)
Calcium: 9.7 mg/dL (ref 8.7–10.2)
GFR calc Af Amer: 82 mL/min/{1.73_m2} (ref >59)
GFR calc non Af Amer: 71 mL/min/{1.73_m2} (ref >59)
GLOBULIN, TOTAL: 2.7 g/dL (ref 1.5–4.5)
Glucose: 91 mg/dL (ref 65–99)
POTASSIUM: 4.2 mmol/L (ref 3.5–5.2)
SODIUM: 143 mmol/L (ref 134–144)
Total Protein: 6.9 g/dL (ref 6.0–8.5)

## 2016-03-18 DIAGNOSIS — H6121 Impacted cerumen, right ear: Secondary | ICD-10-CM | POA: Diagnosis not present

## 2016-03-18 DIAGNOSIS — H9201 Otalgia, right ear: Secondary | ICD-10-CM | POA: Diagnosis not present

## 2016-03-22 DIAGNOSIS — M1611 Unilateral primary osteoarthritis, right hip: Secondary | ICD-10-CM | POA: Diagnosis not present

## 2016-04-10 ENCOUNTER — Ambulatory Visit: Payer: Self-pay | Admitting: Pain Medicine

## 2016-04-12 ENCOUNTER — Encounter: Payer: Self-pay | Admitting: Family Medicine

## 2016-04-12 ENCOUNTER — Ambulatory Visit (INDEPENDENT_AMBULATORY_CARE_PROVIDER_SITE_OTHER): Payer: Commercial Managed Care - HMO | Admitting: Family Medicine

## 2016-04-12 ENCOUNTER — Telehealth: Payer: Self-pay | Admitting: Diagnostic Neuroimaging

## 2016-04-12 VITALS — BP 129/81 | HR 87 | Temp 98.6°F | Wt 278.7 lb

## 2016-04-12 DIAGNOSIS — G4733 Obstructive sleep apnea (adult) (pediatric): Secondary | ICD-10-CM

## 2016-04-12 DIAGNOSIS — F5101 Primary insomnia: Secondary | ICD-10-CM | POA: Diagnosis not present

## 2016-04-12 DIAGNOSIS — M1611 Unilateral primary osteoarthritis, right hip: Secondary | ICD-10-CM | POA: Diagnosis not present

## 2016-04-12 DIAGNOSIS — G894 Chronic pain syndrome: Secondary | ICD-10-CM | POA: Diagnosis not present

## 2016-04-12 DIAGNOSIS — G453 Amaurosis fugax: Secondary | ICD-10-CM | POA: Diagnosis not present

## 2016-04-12 MED ORDER — HYDROCODONE-ACETAMINOPHEN 10-325 MG PO TABS: 1.0000 | ORAL_TABLET | Freq: Four times a day (QID) | ORAL | 0 refills | Status: DC | PRN

## 2016-04-12 NOTE — Assessment & Plan Note (Signed)
Needs to come in monthly for Rx for now. He will need to see pain management as we will only do this for another 2 months- he is aware.

## 2016-04-12 NOTE — Progress Notes (Addendum)
BP 129/81 (BP Location: Left Arm, Patient Position: Sitting, Cuff Size: Large)   Pulse 87   Temp 98.6 F (37 C)   Wt 278 lb 11.2 oz (126.4 kg)   SpO2 100%   BMI 36.57 kg/m    Subjective:    Patient ID: [name]., male    DOB: 05-04-1961, 55 y.o.   MRN: [id number]  HPI: Perle Paxson. is a 55 y.o. male  Chief Complaint  Patient presents with  . Pain   CHRONIC PAIN- Saw [facility] 03/22/16 and to have a total hip replacement done on 05/29/16 Present dose: 40 Morphine equivalents Pain control status: exacerbated Duration: chronic Location: low back, R hip, bilateral knees Quality: dull, aching, sore and stabbing Current Pain Level: severe Previous Pain Level: moderate Breakthrough pain: yes Benefit from narcotic medications: Unsure What Activities task can be accomplished with current medication?: Able to take care of self and work Interested in Careers adviser off narcotics:no  Stool softners/OTC fiber: no  Previous pain specialty evaluation: no Non-narcotic analgesic meds: yes- had been on gabapentin, didn't like it, made him sleepy. Weaned off at a previousvisit Narcotic contract: yes   INSOMNIA- Has not gotten his CPAP yet. Still waking up after 4 hours.   Duration:chronic Satisfied with sleep quality: no Difficulty falling asleep: no Difficulty staying asleep: yes Waking a few hours after sleep onset: yes Early morning awakenings: no Daytime hypersomnolence: yes Wakes feeling refreshed: no Good sleep hygiene: no Apnea: yes- hasn't gotten his CPAP yet Snoring: yes Depressed/anxious mood: no Recent stress: no Restless legs/nocturnal leg cramps: no Chronic pain/arthritis:yes History of sleep study: yes Treatments attempted: melatonin, uinsom, benadryl and ambien  Relevant past medical, surgical, family and social history reviewed and updated as indicated. Interim medical history since our last visit reviewed. Allergies and medications  reviewed and updated.  Review of Systems  Constitutional: Negative.   Respiratory: Negative.   Cardiovascular: Negative.   Psychiatric/Behavioral: Negative.     Per HPI unless specifically indicated above     Objective:    BP 129/81 (BP Location: Left Arm, Patient Position: Sitting, Cuff Size: Large)   Pulse 87   Temp 98.6 F (37 C)   Wt 278 lb 11.2 oz (126.4 kg)   SpO2 100%   BMI 36.57 kg/m   Wt Readings from Last 3 Encounters:  04/12/16 278 lb 11.2 oz (126.4 kg)  03/16/16 281 lb (127.5 kg)  02/09/16 282 lb (127.9 kg)    Physical Exam  Constitutional: He is oriented to person, place, and time. He appears well-developed and well-nourished. No distress.  HENT:  Head: Normocephalic and atraumatic.  Right Ear: Hearing normal.  Left Ear: Hearing normal.  Nose: Nose normal.  Eyes: Conjunctivae and lids are normal. Right eye exhibits no discharge. Left eye exhibits no discharge. No scleral icterus.  Cardiovascular: Normal rate, regular rhythm, normal heart sounds and intact distal pulses.  Exam reveals no gallop and no friction rub.   No murmur heard. Pulmonary/Chest: Effort normal and breath sounds normal. No respiratory distress. He has no wheezes. He has no rales. He exhibits no tenderness.  Musculoskeletal:  Antalgic gait  Neurological: He is alert and oriented to person, place, and time.  Skin: Skin is warm, dry and intact. No rash noted. No erythema. No pallor.  Psychiatric: He has a normal mood and affect. His speech is normal and behavior is normal. Judgment and thought content normal. Cognition and memory are normal.  Nursing note and vitals reviewed.  Results for orders placed or performed in visit on 03/16/16  Bayer DCA Hb A1c Waived  Result Value Ref Range   Bayer DCA Hb A1c Waived 6.4 <7.0 %  Comprehensive metabolic panel  Result Value Ref Range   Glucose 91 65 - 99 mg/dL   BUN 13 6 - 24 mg/dL   Creatinine, Ser 1.17 0.76 - 1.27 mg/dL   GFR calc non Af  Amer 71 >59 mL/min/1.73   GFR calc Af Amer 82 >59 mL/min/1.73   BUN/Creatinine Ratio 11 9 - 20   Sodium 143 134 - 144 mmol/L   Potassium 4.2 3.5 - 5.2 mmol/L   Chloride 102 96 - 106 mmol/L   CO2 25 18 - 29 mmol/L   Calcium 9.7 8.7 - 10.2 mg/dL   Total Protein 6.9 6.0 - 8.5 g/dL   Albumin 4.2 3.5 - 5.5 g/dL   Globulin, Total 2.7 1.5 - 4.5 g/dL   Albumin/Globulin Ratio 1.6 1.2 - 2.2   Bilirubin Total 0.4 0.0 - 1.2 mg/dL   Alkaline Phosphatase 75 39 - 117 IU/L   AST 14 0 - 40 IU/L   ALT 13 0 - 44 IU/L  Lipid Panel Piccolo, Waived  Result Value Ref Range   Cholesterol Piccolo, Waived 172 <200 mg/dL   HDL Chol Piccolo, Waived 43 (L) >59 mg/dL   Triglycerides Piccolo,Waived 167 (H) <150 mg/dL   Chol/HDL Ratio Piccolo,Waive 4.0 mg/dL   LDL Chol Calc Piccolo Waived 95 <100 mg/dL   VLDL Chol Calc Piccolo,Waive 33 (H) <30 mg/dL  Microalbumin, Urine Waived  Result Value Ref Range   Microalb, Ur Waived 30 (H) 0 - 19 mg/L   Creatinine, Urine Waived 300 10 - 300 mg/dL   Microalb/Creat Ratio <30 <30 mg/g  Hemoglobin A1c  Result Value Ref Range   Hemoglobin A1C 6.4       Assessment & Plan:   Problem List Items Addressed This Visit      Cardiovascular and Mediastinum   Amaurosis fugax of left eye    Never followed up with neurology. Called today to get him an appointment. Would like this done prior to clearing him for surgery.        Respiratory   OSA (obstructive sleep apnea)    Has not heard anything about his CPAP. We are checking on this. Await results.         Musculoskeletal and Integument   Degenerative arthritis of hip    To have hip replacement. Scheduled for 05/29/16. Will need clearance prior to that.       Relevant Medications   HYDROcodone-acetaminophen (NORCO) 10-325 MG tablet     Other   Insomnia    Not on his CPAP. We are getting CPAP sorted. Checking in in next couple of days. Not due for klonopin today- next month.       Chronic pain - Primary    Needs  to come in monthly for Rx for now. He will need to see pain management as we will only do this for another 2 months- he is aware.       Relevant Medications   HYDROcodone-acetaminophen (NORCO) 10-325 MG tablet    Other Visit Diagnoses   None.      Follow up plan: Return As scheduled.

## 2016-04-12 NOTE — Assessment & Plan Note (Signed)
To have hip replacement. Scheduled for 05/29/16. Will need clearance prior to that.

## 2016-04-12 NOTE — Patient Instructions (Addendum)
  Call your insurance company to find out who is in network for a CPAP machine so we can get you treated BEFORE Friday. I will call you Friday to put in the referral.   Go to your heart doctor appointment on 04/19/16 to get clearance for surgery.   Come back to me for surgical clearance for everything else.   We will call you with your neurology appointment.

## 2016-04-12 NOTE — Assessment & Plan Note (Signed)
Has not heard anything about his CPAP. We are checking on this. Await results.

## 2016-04-12 NOTE — Telephone Encounter (Signed)
[name]/Dr Park Liter [phone] called to schedule appt, the pt is having hip surgery on 12/8. The Dr wants the pt seen for neurological eval prior to surgery. Please her to schedule the appt, not the pt.

## 2016-04-12 NOTE — Assessment & Plan Note (Signed)
Not on his CPAP. We are getting CPAP sorted. Checking in in next couple of days. Not due for klonopin today- next month.

## 2016-04-12 NOTE — Assessment & Plan Note (Signed)
Never followed up with neurology. Called today to get him an appointment. Would like this done prior to clearing him for surgery.

## 2016-04-12 NOTE — Telephone Encounter (Signed)
LVM for [name] advising her that patient has bad debt balance in this office. His appointment will have to go through [facility]yman Rodney in billing dept. Advised [name] this RN will route the call to Angie. Left this RN's name and number.

## 2016-04-13 NOTE — Telephone Encounter (Signed)
Patient states he will call tomorrow and pay the account balance. Thanks Angie

## 2016-04-18 ENCOUNTER — Telehealth: Payer: Self-pay | Admitting: Family Medicine

## 2016-04-18 NOTE — Telephone Encounter (Signed)
Rickey Glover from [place] called stated she needs office notes that date back prior to the sleep study (07/07/15) that state something snoring, fatigue, or a referral for a sleep study. Please send information ASAP. Fax # [phone]. Thanks.

## 2016-04-18 NOTE — Telephone Encounter (Signed)
Notes faxed.

## 2016-04-19 ENCOUNTER — Telehealth: Payer: Self-pay | Admitting: Family Medicine

## 2016-04-19 ENCOUNTER — Ambulatory Visit (INDEPENDENT_AMBULATORY_CARE_PROVIDER_SITE_OTHER): Payer: Commercial Managed Care - HMO | Admitting: Cardiology

## 2016-04-19 ENCOUNTER — Encounter: Payer: Self-pay | Admitting: Cardiology

## 2016-04-19 ENCOUNTER — Encounter: Payer: Self-pay | Admitting: Family Medicine

## 2016-04-19 VITALS — BP 100/70 | HR 87 | Ht 73.2 in | Wt 282.4 lb

## 2016-04-19 DIAGNOSIS — E6609 Other obesity due to excess calories: Secondary | ICD-10-CM | POA: Diagnosis not present

## 2016-04-19 DIAGNOSIS — R0602 Shortness of breath: Secondary | ICD-10-CM | POA: Diagnosis not present

## 2016-04-19 DIAGNOSIS — Z01810 Encounter for preprocedural cardiovascular examination: Secondary | ICD-10-CM | POA: Diagnosis not present

## 2016-04-19 DIAGNOSIS — Z6837 Body mass index (BMI) 37.0-37.9, adult: Secondary | ICD-10-CM

## 2016-04-19 DIAGNOSIS — I10 Essential (primary) hypertension: Secondary | ICD-10-CM | POA: Diagnosis not present

## 2016-04-19 DIAGNOSIS — F17200 Nicotine dependence, unspecified, uncomplicated: Secondary | ICD-10-CM

## 2016-04-19 NOTE — Telephone Encounter (Signed)
Patient called requests a call back from Dr. Wynetta Emery in regards to the appointment he had today. Please call pt ASAP. Thanks.

## 2016-04-19 NOTE — Patient Instructions (Addendum)
Testing/Procedures: [place]  Your caregiver has ordered a Stress Test with nuclear imaging. The purpose of this test is to evaluate the blood supply to your heart muscle. This procedure is referred to as a "Non-Invasive Stress Test." This is because other than having an IV started in your vein, nothing is inserted or "invades" your body. Cardiac stress tests are done to find areas of poor blood flow to the heart by determining the extent of coronary artery disease (CAD). Some patients exercise on a treadmill, which naturally increases the blood flow to your heart, while others who are  unable to walk on a treadmill due to physical limitations have a pharmacologic/chemical stress agent called Lexiscan . This medicine will mimic walking on a treadmill by temporarily increasing your coronary blood flow.   Please note: these test may take anywhere between 2-4 hours to complete  PLEASE REPORT TO [place] AT THE FIRST DESK WILL DIRECT YOU WHERE TO GO  Date of Procedure:__Friday April 28, 2016 at 9:30AM_  Arrival Time for Procedure:__Arrive at 07:15AM to register___  Instructions regarding medication:   __X__ : Hold diabetes medication Glimepiride (Amaryl) and Metformin (Glucophage) the morning of procedure  Also hold lasix and spirolactone that morning until after your procedure.    PLEASE NOTIFY THE OFFICE AT LEAST 61 HOURS IN ADVANCE IF YOU ARE UNABLE TO KEEP YOUR APPOINTMENT.  [phone] AND  PLEASE NOTIFY NUCLEAR MEDICINE AT [facility] AT LEAST 24 HOURS IN ADVANCE IF YOU ARE UNABLE TO KEEP YOUR APPOINTMENT. [phone]  How to prepare for your Myoview test:  1. Do not eat or drink after midnight 2. No caffeine for 24 hours prior to test 3. No smoking 24 hours prior to test. 4. Your medication may be taken with water.  If your doctor stopped a medication because of this test, do not take that medication. 5. Ladies, please do not wear dresses.   Skirts or pants are appropriate. Please wear a short sleeve shirt. 6. No perfume, cologne or lotion. 7. Wear comfortable walking shoes. No heels!    Follow-Up: Your physician recommends that you schedule a follow-up appointment as needed with Dr. Yvone Neu. We will call you with results and if needed schedule follow up at that time.  It was a pleasure seeing you today here in the office. Please do not hesitate to give [place] a call back if you have any further questions. [place], BSN    Pharmacologic Stress Electrocardiogram A pharmacologic stress electrocardiogram is a heart (cardiac) test that uses nuclear imaging to evaluate the blood supply to your heart. This test may also be called a pharmacologic stress electrocardiography. Pharmacologic means that a medicine is used to increase your heart rate and blood pressure.  This stress test is done to find areas of poor blood flow to the heart by determining the extent of coronary artery disease (CAD). Some people exercise on a treadmill, which naturally increases the blood flow to the heart. For those people unable to exercise on a treadmill, a medicine is used. This medicine stimulates your heart and will cause your heart to beat harder and more quickly, as if you were exercising.  Pharmacologic stress tests can help determine:  The adequacy of blood flow to your heart during increased levels of activity in order to clear you for discharge home.  The extent of coronary artery blockage caused by CAD.  Your prognosis if you have suffered a heart attack.  The effectiveness of cardiac procedures done, such as an angioplasty, which can increase the circulation in your coronary arteries.  Causes of chest pain or pressure. LET [facility] CARE PROVIDER KNOW ABOUT:  Any allergies you have.  All medicines you are taking, including vitamins, herbs, eye drops, creams, and over-the-counter medicines.  Previous problems you or  members of your family have had with the use of anesthetics.  Any blood disorders you have.  Previous surgeries you have had.  Medical conditions you have.  Possibility of pregnancy, if this applies.  If you are currently breastfeeding. RISKS AND COMPLICATIONS Generally, this is a safe procedure. However, as with any procedure, complications can occur. Possible complications include:  You develop pain or pressure in the following areas:  Chest.  Jaw or neck.  Between your shoulder blades.  Radiating down your left arm.  Headache.  Dizziness or light-headedness.  Shortness of breath.  Increased or irregular heartbeat.  Low blood pressure.  Nausea or vomiting.  Flushing.  Redness going up the arm and slight pain during injection of medicine.  Heart attack (rare). BEFORE THE PROCEDURE   Avoid all forms of caffeine for 24 hours before your test or as directed by your health care provider. This includes coffee, tea (even decaffeinated tea), caffeinated sodas, chocolate, cocoa, and certain pain medicines.  Follow your health care provider's instructions regarding eating and drinking before the test.  Take your medicines as directed at regular times with water unless instructed otherwise. Exceptions may include:  If you have diabetes, ask how you are to take your insulin or pills. It is common to adjust insulin dosing the morning of the test.  If you are taking beta-blocker medicines, it is important to talk to your health care provider about these medicines well before the date of your test. Taking beta-blocker medicines may interfere with the test. In some cases, these medicines need to be changed or stopped 24 hours or more before the test.  If you wear a nitroglycerin patch, it may need to be removed prior to the test. Ask your health care provider if the patch should be removed before the test.  If you use an inhaler for any breathing condition, bring it with you  to the test.  If you are an outpatient, bring a snack so you can eat right after the stress phase of the test.  Do not smoke for 4 hours prior to the test or as directed by your health care provider.  Do not apply lotions, powders, creams, or oils on your chest prior to the test.  Wear comfortable shoes and clothing. Let your health care provider know if you were unable to complete or follow the preparations for your test. PROCEDURE   Multiple patches (electrodes) will be put on your chest. If needed, small areas of your chest may be shaved to get better contact with the electrodes. Once the electrodes are attached to your body, multiple wires will be attached to the electrodes, and your heart rate will be monitored.  An IV access will be started. A nuclear trace (isotope) is given. The isotope may be given intravenously, or it may be swallowed. Nuclear refers to several types of radioactive isotopes, and the nuclear isotope lights up the arteries so that the nuclear images are clear. The isotope is absorbed by your body. This results in low radiation exposure.  A resting nuclear image is taken to show how your heart functions at rest.  A medicine is  given through the IV access.  A second scan is done about 1 hour after the medicine injection and determines how your heart functions under stress.  During this stress phase, you will be connected to an electrocardiogram machine. Your blood pressure and oxygen levels will be monitored. AFTER THE PROCEDURE   Your heart rate and blood pressure will be monitored after the test.  You may return to your normal schedule, including diet,activities, and medicines, unless your health care provider tells you otherwise.   This information is not intended to replace advice given to you by your health care provider. Make sure you discuss any questions you have with your health care provider.   Document Released: 10/22/2008 Document Revised: 06/10/2013  Document Reviewed: 02/10/2013 Elsevier Interactive Patient Education [employer].

## 2016-04-19 NOTE — Telephone Encounter (Signed)
Called and spoke to patient. He will get stress test done. Will get CPAP. Patient reassured.

## 2016-04-19 NOTE — Progress Notes (Signed)
Cardiology Office Note   Date:  04/19/2016   ID:  Rickey Gottron., DOB July 15, 1960, MRN [id number]  Referring Doctor:  Park Liter, DO   Cardiologist:   Wende Bushy, MD   Reason for consultation:  No chief complaint on file.  Preoperative cardiac evaluation   History of Present Illness: Rickey Piwowar. is a 55 y.o. male who presents for preoperative cardiac evaluation for right hip replacement surgery  Patient denies chest pain. He does have shortness of breath with walking. This has been going on for quite some time, symptoms mainly in the chest, nonradiating, mild intensity, occurs with walking, goes away with rest. He also has a lot of difficulty with walking because of the right hip issue. Next  Patient denies PND, orthopnea, edema. He is on the process of getting a CPAP for his OSA. No loss of consciousness.   ROS:  Please see the history of present illness. Aside from mentioned under HPI, all other systems are reviewed and negative.     Past Medical History:  Diagnosis Date  . Chronic back pain   . DDD (degenerative disc disease), lumbar   . Diabetes mellitus without complication ([place])   . Hip pain   . Hyperlipidemia   . Hypertension   . Insomnia   . Knee pain, left   . LAP-BAND surgery status   . Sciatica of right side   . Sleep apnea    new DX - has not been for CPAP titration yet  . Vitamin D deficiency     Past Surgical History:  Procedure Laterality Date  . COLONOSCOPY WITH PROPOFOL N/A 07/15/2015   Procedure: COLONOSCOPY WITH PROPOFOL;  Surgeon: Lucilla Lame, MD;  Location: [facility];  Service: Endoscopy;  Laterality: N/A;  Diabetic - oral meds Sleep apnea  . lap band surgery    . LUMBAR SPINE SURGERY  April 2017     reports that he has been smoking Cigarettes.  He has a 20.00 pack-year smoking history. He has never used smokeless tobacco. He reports that he does not drink alcohol or use drugs.   family history includes  Aneurysm in his mother; Stroke in his maternal grandmother and paternal grandmother.   Outpatient Medications Prior to Visit  Medication Sig Dispense Refill  . amLODipine (NORVASC) 10 MG tablet Take 1 tablet (10 mg total) by mouth daily. 90 tablet 1  . aspirin 81 MG tablet Take 81 mg by mouth daily.    . clonazePAM (KLONOPIN) 0.5 MG tablet 1 tablet at bedtime as needed (try every other day at least) 30 tablet 0  . clopidogrel (PLAVIX) 75 MG tablet Take 1 tablet (75 mg total) by mouth daily. 90 tablet 3  . furosemide (LASIX) 40 MG tablet Take 40 mg by mouth daily.    Marland Kitchen glimepiride (AMARYL) 4 MG tablet Take 1 tablet (4 mg total) by mouth 2 (two) times daily. 180 tablet 1  . HYDROcodone-acetaminophen (NORCO) 10-325 MG tablet Take 1 tablet by mouth every 6 (six) hours as needed. 100 tablet 0  . metFORMIN (GLUCOPHAGE-XR) 500 MG 24 hr tablet Take 1 tablet (500 mg total) by mouth 2 (two) times daily with a meal. 360 tablet 1  . quinapril (ACCUPRIL) 40 MG tablet Take 1 tablet (40 mg total) by mouth daily. 90 tablet 1  . spironolactone (ALDACTONE) 25 MG tablet Take 1 tablet (25 mg total) by mouth daily. 90 tablet 1   No facility-administered medications prior to visit.  Allergies: Percocet [oxycodone-acetaminophen]    PHYSICAL EXAM: VS:  BP 100/70 (BP Location: Right Arm, Cuff Size: Normal)   Pulse 87   Ht 6' 1.2" (1.859 m)   Wt 282 lb 6.4 oz (128.1 kg)   BMI 37.05 kg/m  , Body mass index is 37.05 kg/m. Wt Readings from Last 3 Encounters:  04/19/16 282 lb 6.4 oz (128.1 kg)  04/12/16 278 lb 11.2 oz (126.4 kg)  03/16/16 281 lb (127.5 kg)    GENERAL:  well developed, well nourished, obese, not in acute distress HEENT: normocephalic, pink conjunctivae, anicteric sclerae, no xanthelasma, normal dentition, oropharynx clear NECK:  no neck vein engorgement, JVP normal, no hepatojugular reflux, carotid upstroke brisk and symmetric, no bruit, no thyromegaly, no lymphadenopathy LUNGS:  good  respiratory effort, clear to auscultation bilaterally CV:  PMI not displaced, no thrills, no lifts, S1 and S2 within normal limits, no palpable S3 or S4, no murmurs, no rubs, no gallops ABD:  Soft, nontender, nondistended, normoactive bowel sounds, no abdominal aortic bruit, no hepatomegaly, no splenomegaly MS: nontender back, no kyphosis, no scoliosis, no joint deformities EXT:  2+ DP/PT pulses, no edema, no varicosities, no cyanosis, no clubbing SKIN: warm, nondiaphoretic, normal turgor, no ulcers NEUROPSYCH: alert, oriented to person, place, and time, sensory/motor grossly intact, normal mood, appropriate affect  Recent Labs: 04/27/2015: TSH 0.932 12/16/2015: Platelets 331 03/16/2016: ALT 13; BUN 13; Creatinine, Ser 1.17; Potassium 4.2; Sodium 143   Lipid Panel    Component Value Date/Time   CHOL 172 03/16/2016 1549   TRIG 167 (H) 03/16/2016 1549   HDL 35 (L) 10/04/2015 1409   VLDL 33 (H) 03/16/2016 1549   LDLCALC 80 10/04/2015 1409     Other studies Reviewed:  EKG:  The ekg from 04/19/2016 was personally reviewed by me and it revealed sinus rhythm, nonspecific T-wave changes. Ventricle rate 87 BPM.  Additional studies/ records that were reviewed personally reviewed by me today include:  Echo 06/29/2015: Left ventricle: The cavity size was mildly dilated. There was   moderate concentric hypertrophy. Systolic function was normal.   The estimated ejection fraction was in the range of 60% to 65%.   Wall motion was normal; there were no regional wall motion   abnormalities. Doppler parameters are consistent with abnormal   left ventricular relaxation (grade 1 diastolic dysfunction). - Left atrium: The atrium was mildly dilated  ASSESSMENT AND PLAN:  Shortness of breath Abnormal EKG Preoperative cardiac evaluation Risk factors include diabetes, hypertension, smoking history Recommend further evaluation with pharmacologic nuclear stress testing. If no evidence of significant  ischemia on the stress test, cardiac risk will likely be intermediate for planned right hip surgery.  Hypertension BP is well controlled. Continue monitoring BP. Continue current medical therapy and lifestyle changes.   Obesity Body mass index is 37.05 kg/m.Marland Kitchen Recommend aggressive weight loss through diet and increased physical activity. Once cardiac workup is completed.  Tobacco use We discussed the importance of smoking cessation and different strategies for quitting.   Because of diabetes, LDL goal is less than 70.  Current medicines are reviewed at length with the patient today.  The patient does not have concerns regarding medicines.  Labs/ tests ordered today include:  Orders Placed This Encounter  Procedures  . NM Myocar Multi W/Spect W/Wall Motion / EF  . EKG 12-Lead    I had a lengthy and detailed discussion with the patient regarding diagnoses, prognosis, diagnostic options, treatment options , and side effects of medications.   I counseled  the patient on importance of lifestyle modification including heart healthy diet, regular physical activity once cardiac workup is completed , and smoking cessation.   Disposition:   FU with undersigned after tests    Signed, Wende Bushy, MD  04/19/2016 10:57 AM    [place]  This note was generated in part with voice recognition software and I apologize for any typographical errors that were not detected and corrected.

## 2016-04-24 ENCOUNTER — Telehealth: Payer: Self-pay | Admitting: Family Medicine

## 2016-04-24 NOTE — Telephone Encounter (Signed)
Patient states that he is rescheduling his 04/27/16 appt with  you until after he has his stress test on 11/10 and receives his cpap machine. I also advised the patient that he has an appt at [facility] on 05/03/16 and he stated that he was unaware and would like to cancel that appt until after his hip surgery. Please advise the patient if he is required to see the neurologist before surgical clearance.

## 2016-04-24 NOTE — Telephone Encounter (Signed)
Patient notified to go to his appointment at Neurology, he has to be seen there before he can have clearance, notified by Charolotte Eke.

## 2016-04-25 ENCOUNTER — Telehealth: Payer: Self-pay | Admitting: Family Medicine

## 2016-04-25 NOTE — Telephone Encounter (Signed)
Patient wants to discuss Neurology and surgery

## 2016-04-25 NOTE — Telephone Encounter (Signed)
Tiff can you find out what he needs?

## 2016-04-25 NOTE — Telephone Encounter (Signed)
Called to speak to [place]. Explained to him that his neurologist and his cardiologist are 2 different things and that we have to wait on his appointments to clear him. He is frustrated. He is still waiting to hear on his CPAP.   Alwyn Ren, any word on his CPAP?

## 2016-04-26 ENCOUNTER — Telehealth: Payer: Self-pay | Admitting: Family Medicine

## 2016-04-26 NOTE — Telephone Encounter (Signed)
Authorization submitted and pending review.

## 2016-04-27 ENCOUNTER — Ambulatory Visit: Payer: Commercial Managed Care - HMO | Admitting: Family Medicine

## 2016-04-28 ENCOUNTER — Encounter
Admission: RE | Admit: 2016-04-28 | Discharge: 2016-04-28 | Disposition: A | Discharge status: Home / Self Care | Payer: Commercial Managed Care - HMO | Source: Ambulatory Visit | Attending: Cardiology | Admitting: Cardiology

## 2016-04-28 DIAGNOSIS — R0602 Shortness of breath: Secondary | ICD-10-CM | POA: Diagnosis not present

## 2016-04-28 DIAGNOSIS — Z01810 Encounter for preprocedural cardiovascular examination: Secondary | ICD-10-CM

## 2016-04-28 MED ORDER — TECHNETIUM TC 99M TETROFOSMIN IV KIT
12.5000 | PACK | Freq: Once | INTRAVENOUS | Status: AC | PRN
  Administered 2016-04-28: 13.92 via INTRAVENOUS

## 2016-04-28 MED ORDER — REGADENOSON 0.4 MG/5ML IV SOLN
0.4000 mg | Freq: Once | INTRAVENOUS | Status: AC
  Administered 2016-04-28: 0.4 mg via INTRAVENOUS

## 2016-04-28 MED ORDER — TECHNETIUM TC 99M TETROFOSMIN IV KIT
33.0000 | PACK | Freq: Once | INTRAVENOUS | Status: AC | PRN
  Administered 2016-04-28: 30.704 via INTRAVENOUS

## 2016-04-28 NOTE — Telephone Encounter (Signed)
I did a retro-authorization for date 11/05. It's still in review but they usually do not approve retro-authorizations. But I did it so it could be on file  I did go ahead and did another authorization starting today (04/28/2016) for 6 months and 6 visits.  It was approved. Authorization number [id number].

## 2016-04-30 LAB — NM MYOCAR MULTI W/SPECT W/WALL MOTION / EF
CHL CUP NUCLEAR SDS: 1
CHL CUP RESTING HR STRESS: 64 {beats}/min
CSEPPHR: 81 {beats}/min
LV dias vol: 163 mL (ref 62–150)
LVSYSVOL: 90 mL
SRS: 8
SSS: 5
TID: 1.08

## 2016-05-01 ENCOUNTER — Telehealth: Payer: Self-pay | Admitting: Family Medicine

## 2016-05-01 DIAGNOSIS — M1611 Unilateral primary osteoarthritis, right hip: Secondary | ICD-10-CM | POA: Diagnosis not present

## 2016-05-01 DIAGNOSIS — Z01818 Encounter for other preprocedural examination: Secondary | ICD-10-CM | POA: Diagnosis not present

## 2016-05-01 NOTE — Telephone Encounter (Signed)
Called Rickey Glover and provided  [facility] Authorization# [id number] for CPAP machine. Advised patient to answer all phone call as someone from [place] will contact him today to set up an appt to deliver the CPAP machine.   Also contacted [facility] as well.

## 2016-05-01 NOTE — Telephone Encounter (Signed)
Pt called needs refills on the following:  Lasix And his other fluid pill   Phone went out before I could get the pharmacy. Thanks

## 2016-05-02 ENCOUNTER — Other Ambulatory Visit: Payer: Self-pay | Admitting: Family Medicine

## 2016-05-02 ENCOUNTER — Telehealth: Payer: Self-pay | Admitting: Family Medicine

## 2016-05-02 NOTE — Telephone Encounter (Signed)
Pt called and would like to know if he needs to go to his appt with Dr Leta Baptist. He stated that he didn't want to take the time off of work if he was only going to hear the same thing twice.

## 2016-05-02 NOTE — Telephone Encounter (Signed)
He got 6 months of his spironalactone in September, so he shouldn't be due. I've never written his lasix- he should call the cardiologist for that.

## 2016-05-02 NOTE — Telephone Encounter (Signed)
Spoke with patient, he is upset that he has to go to so many appointments. Please call him.

## 2016-05-02 NOTE — Telephone Encounter (Signed)
Per Dr. Wynetta Emery, [facility] advising patient that he must keep appointment with Dr. Leta Baptist.

## 2016-05-02 NOTE — Telephone Encounter (Signed)
As discussed

## 2016-05-03 ENCOUNTER — Telehealth: Payer: Self-pay | Admitting: Family Medicine

## 2016-05-03 ENCOUNTER — Telehealth: Payer: Self-pay | Admitting: *Deleted

## 2016-05-03 ENCOUNTER — Encounter: Payer: Self-pay | Admitting: Diagnostic Neuroimaging

## 2016-05-03 ENCOUNTER — Ambulatory Visit (INDEPENDENT_AMBULATORY_CARE_PROVIDER_SITE_OTHER): Payer: Commercial Managed Care - HMO | Admitting: Diagnostic Neuroimaging

## 2016-05-03 VITALS — BP 104/72 | HR 83 | Wt 278.8 lb

## 2016-05-03 DIAGNOSIS — I10 Essential (primary) hypertension: Secondary | ICD-10-CM

## 2016-05-03 DIAGNOSIS — Z720 Tobacco use: Secondary | ICD-10-CM

## 2016-05-03 DIAGNOSIS — G453 Amaurosis fugax: Secondary | ICD-10-CM | POA: Diagnosis not present

## 2016-05-03 DIAGNOSIS — E785 Hyperlipidemia, unspecified: Secondary | ICD-10-CM | POA: Diagnosis not present

## 2016-05-03 DIAGNOSIS — E1165 Type 2 diabetes mellitus with hyperglycemia: Secondary | ICD-10-CM | POA: Diagnosis not present

## 2016-05-03 DIAGNOSIS — G4733 Obstructive sleep apnea (adult) (pediatric): Secondary | ICD-10-CM | POA: Diagnosis not present

## 2016-05-03 DIAGNOSIS — R0600 Dyspnea, unspecified: Secondary | ICD-10-CM

## 2016-05-03 NOTE — Progress Notes (Signed)
[facility]  PATIENT: Rickey Glover. DOB: 17-May-1961  REFERRING CLINICIAN: Mellody Memos, MD HISTORY FROM: patient REASON FOR VISIT: follow up    HISTORICAL  CHIEF COMPLAINT:  Chief Complaint  Patient presents with  . Amaurosis of left eye    rm 7  . Follow-up    FU prior to Total Hip Replacement surgery 05/29/16    HISTORY OF PRESENT ILLNESS:   UPDATE 05/03/16: Since last visit, doing well. No further neurologic attacks. Tolerating medications. BP and A1c and lipids are improving. Planning to get back on CPAP therapy soon. Also planning to have right hip replacement in Dec 2017. Here for pre-op evaluation. Also in May 2017 was switched from aggrenox to clopidogrel due to cost / insurance coverage. Continues on aspirin as well.   PRIOR HPI (06/07/15): 55 year old male here for evaluation of transient left eye vision loss. 3 months ago patient had sudden onset left eye vision loss lasting for 1 minute. He did not seek medical attention. No other associated symptoms noted. Apparently patient was evaluated by neurologist at [facility], the patient does not recall this evaluation. I reviewed notes through Epic/care everywhere Link, and found neurology notes from Dr. Christella Hartigan, who recommended MRI brain, MRA head and neck, echocardiogram, stroke risk factor reduction. Patient does not recall this evaluation. I also found notes summarizing sleep apnea evaluation at [facility] clinic, where patient was diagnosed with sleep apnea and recommended to use CPAP therapy. Patient reports difficulty in following up with sleep clinic due to change in insurance and problems with his machine. Review of other notes in our computer system also indicate multiple occurrences of no-show, being lost to follow-up, and compliance issues. Today patient is here with his fianc who is now trying to help patient follow-up with his office visits, testing and treatments.    REVIEW OF  SYSTEMS: Full 14 system review of systems performed and negative except as per HPI.    ALLERGIES: Allergies  Allergen Reactions  . Percocet [Oxycodone-Acetaminophen]     HOME MEDICATIONS: Outpatient Medications Prior to Visit  Medication Sig Dispense Refill  . amLODipine (NORVASC) 10 MG tablet Take 1 tablet (10 mg total) by mouth daily. 90 tablet 1  . aspirin 81 MG tablet Take 81 mg by mouth daily.    . clonazePAM (KLONOPIN) 0.5 MG tablet 1 tablet at bedtime as needed (try every other day at least) 30 tablet 0  . clopidogrel (PLAVIX) 75 MG tablet Take 1 tablet (75 mg total) by mouth daily. 90 tablet 3  . furosemide (LASIX) 40 MG tablet Take 40 mg by mouth daily.    Marland Kitchen glimepiride (AMARYL) 4 MG tablet Take 1 tablet (4 mg total) by mouth 2 (two) times daily. 180 tablet 1  . HYDROcodone-acetaminophen (NORCO) 10-325 MG tablet Take 1 tablet by mouth every 6 (six) hours as needed. 100 tablet 0  . metFORMIN (GLUCOPHAGE-XR) 500 MG 24 hr tablet Take 1 tablet (500 mg total) by mouth 2 (two) times daily with a meal. 360 tablet 1  . quinapril (ACCUPRIL) 40 MG tablet Take 1 tablet (40 mg total) by mouth daily. 90 tablet 1  . spironolactone (ALDACTONE) 25 MG tablet Take 1 tablet (25 mg total) by mouth daily. 90 tablet 1   No facility-administered medications prior to visit.     PAST MEDICAL HISTORY: Past Medical History:  Diagnosis Date  . Chronic back pain   . DDD (degenerative disc disease), lumbar   . Diabetes mellitus without complication (  HCC)   . Hip pain   . Hyperlipidemia   . Hypertension   . Insomnia   . Knee pain, left   . LAP-BAND surgery status   . Sciatica of right side   . Sleep apnea    new DX - has not been for CPAP titration yet  . Vitamin D deficiency     PAST SURGICAL HISTORY: Past Surgical History:  Procedure Laterality Date  . COLONOSCOPY WITH PROPOFOL N/A 07/15/2015   Procedure: COLONOSCOPY WITH PROPOFOL;  Surgeon: Lucilla Lame, MD;  Location: [place];   Service: Endoscopy;  Laterality: N/A;  Diabetic - oral meds Sleep apnea  . lap band surgery    . LUMBAR SPINE SURGERY  April 2017    FAMILY HISTORY: Family History  Problem Relation Age of Onset  . Aneurysm Mother     Brain  . Stroke Maternal Grandmother   . Stroke Paternal Grandmother     SOCIAL HISTORY:  Social History   Social History  . Marital status: Single    Spouse name: N/A  . Number of children: 3  . Years of education: 12   Occupational History  .      not working, Geophysicist/field seismologist in past   Social History Main Topics  . Smoking status: Current Every Day Smoker    Packs/day: 0.50    Years: 20.00    Types: Cigarettes  . Smokeless tobacco: Never Used  . Alcohol use No  . Drug use: No  . Sexual activity: Not Currently    Birth control/ protection: None   Other Topics Concern  . Not on file   Social History Narrative   ** Merged History Encounter **       Engaged to [employer], lives at home with Pam  caffeine use- coffee- 1 cup daily, sodas- 1 daily     PHYSICAL EXAM  GENERAL EXAM/CONSTITUTIONAL: Vitals:  Vitals:   05/03/16 0751  BP: 104/72  Pulse: 83  Weight: 278 lb 12.8 oz (126.5 kg)   Wt Readings from Last 3 Encounters:  05/03/16 278 lb 12.8 oz (126.5 kg)  04/19/16 282 lb 6.4 oz (128.1 kg)  04/12/16 278 lb 11.2 oz (126.4 kg)    Body mass index is 36.58 kg/m. No exam data present  Patient is in no distress; well developed, nourished and groomed; neck is supple  CARDIOVASCULAR:  Examination of carotid arteries is normal; no carotid bruits  Regular rate and rhythm, no murmurs  Examination of peripheral vascular system by observation and palpation is normal  EYES:  Ophthalmoscopic exam of optic discs and posterior segments is normal; no papilledema or hemorrhages  MUSCULOSKELETAL:  Gait, strength, tone, movements noted in Neurologic exam below  NEUROLOGIC: MENTAL STATUS:  No flowsheet data found.  awake, alert, oriented to person,  place and time  recent and remote memory intact  normal attention and concentration  language fluent, comprehension intact, naming intact,   fund of knowledge appropriate  CRANIAL NERVE:   2nd - no papilledema on fundoscopic exam  2nd, 3rd, 4th, 6th - pupils equal and reactive to light, visual fields full to confrontation, extraocular muscles intact, no nystagmus  5th - facial sensation symmetric  7th - facial strength symmetric  8th - hearing intact  9th - palate elevates symmetrically, uvula midline  11th - shoulder shrug symmetric  12th - tongue protrusion midline  SLURRED SPEECH  MOTOR:   normal bulk and tone, full strength in the BUE, RLE; LLE 3  SENSORY:  normal and symmetric to light touch, temperature, vibration  COORDINATION:   finger-nose-finger, fine finger movements SLOW  REFLEXES:   deep tendon reflexes TRACE and symmetric  GAIT/STATION:   ANTALGIC GAIT; LEFT KNEE BRACE    DIAGNOSTIC DATA (LABS, IMAGING, TESTING) - I reviewed patient records, labs, notes, testing and imaging myself where available.  Lab Results  Component Value Date   WBC 14.3 (H) 12/16/2015   HCT 44.1 12/16/2015   MCV 86 12/16/2015   PLT 331 12/16/2015      Component Value Date/Time   NA 143 03/16/2016 1549   K 4.2 03/16/2016 1549   CL 102 03/16/2016 1549   CO2 25 03/16/2016 1549   GLUCOSE 91 03/16/2016 1549   BUN 13 03/16/2016 1549   CREATININE 1.17 03/16/2016 1549   CALCIUM 9.7 03/16/2016 1549   PROT 6.9 03/16/2016 1549   ALBUMIN 4.2 03/16/2016 1549   AST 14 03/16/2016 1549   ALT 13 03/16/2016 1549   ALKPHOS 75 03/16/2016 1549   BILITOT 0.4 03/16/2016 1549   GFRNONAA 71 03/16/2016 1549   GFRAA 82 03/16/2016 1549   Lipid Panel     Component Value Date/Time   CHOL 172 03/16/2016 1549   TRIG 167 (H) 03/16/2016 1549   HDL 35 (L) 10/04/2015 1409   VLDL 33 (H) 03/16/2016 1549   LDLCALC 80 10/04/2015 1409   Lab Results  Component Value Date   HGBA1C  6.4 03/16/2016   No results found for: [id number] Lab Results  Component Value Date   TSH 0.932 04/27/2015    10/31/14 MRI lumbar spine  1.No significant change from prior examination. 2.Multilevel degenerative disc and facet disease as well as epidural lipomatosis superimposed upon congenitally small spinal canal. Resultant severe spinal canal narrowing at L3-L4 and L4-L5. Moderate spinal canal narrowing at other levels. 3.Multilevel neuroforaminal narrowing; severe on the right L3-L4 and severe on the left at L5-L6.  08/24/14 polysomnogram (Dr. Lisabeth Devoid) 1) CPAP titration demonstrated improvement in the patient's sleep quality. 2) CPAP pressure of 8 cwp with heated humidification is recommended for initial home PAP therapy.  05/26/14 MRI brain  1. Small acute white matter infarct of the periventricular white matter of the frontoparietal junction. 2. No additional acute intracranial abnormalities are noted.  05/27/15 MRA head  - Normal MRA of the brain.  05/26/14 MRA neck 1. No significant plaque or filling defect of the carotid or vertebral arteries within the neck. 2. Somewhat limited study due to motion artifact.  05/26/14 TTE  - PRESERVED LV SYSTOLIC FUNCTION - MODERATE SEVERE CONCENTRIC LVH - NORMAL RV MILD RVH - MILD LAE - NORMAL VALVES - NO PERICARDIAL EFFUSION - NEGATIVE MICRO-CAVITATION - COMPARED TO PRIOR:NO SIGNIFICANT CHANGE  06/09/15 carotid u/s - The vertebral arteries appear patent with antegrade flow. - Findings consistent with 1- 39 percent stenosis involving the right internal carotid artery and the left internal carotid artery.  06/18/15 MRI brain  1.  Two T2 hyperintense foci within the left hemisphere, one involving the inferior left lentiform nucleus and adjacent internal capsule and another in the periventricular white matter. Both of these appear to be chronic. These foci could represent areas of chronic microvascular ischemic change or demyelination. 2.    There are no acute findings. 3.   Flow voids were present within the major intracerebral arteries.  06/29/15 TTE  - Left ventricle: The cavity size was mildly dilated. There was moderate concentric hypertrophy. Systolic function was normal. The estimated ejection fraction was in the range of  60% to 65%. Wall motion was normal; there were no regional wall motion abnormalities. Doppler parameters are consistent with abnormal left ventricular relaxation (grade 1 diastolic dysfunction). - Left atrium: The atrium was mildly dilated.    ASSESSMENT AND PLAN  55 y.o. year old male here with transient left eye visual loss in Sept 2016, concerning for TIA/amaurosis fugax of left eye. Patient had significant uncontrolled stroke risk factors including hypertension, diabetes, hyperlipidemia, tobacco abuse, obstructive sleep apnea. Now doing better under the care of Dr. Wynetta Emery.   Dx:  Amaurosis fugax of left eye  Type 2 diabetes mellitus with hyperglycemia, without long-term current use of insulin (HCC)  Essential hypertension  Hyperlipidemia, unspecified hyperlipidemia type  Tobacco abuse  OSA (obstructive sleep apnea)    PLAN:  - consider de-escalating dual to single anti-platelet for stroke prevention. For example, I would recommend continuing clopidogrel 75mg  daily and stopping aspirin 81mg ; dual-antiplatelet only recommended in first 3 months after TIA/stroke in high risk patients with severe intracranial atherosclerosis. I would continue dual anti-platelet therapy only if there is some other non-stroke vascular or cardiac indication. - continue follow up with Dr. Wynetta Emery regarding high blood pressure, diabetes, high cholesterol and smoking cessation - follow up with CPAP treatment for sleep apnea - no neurologic contraindication for hip replacement surgery at this time  Return if symptoms worsen or fail to improve, for return to PCP.    Penni Bombard, MD [id number], [id number]  AM Certified in Neurology, Neurophysiology and Neuroimaging  [facility] [address] [place], [place] [address] [phone]

## 2016-05-03 NOTE — Telephone Encounter (Signed)
Appointment changed to May 22, 2016.

## 2016-05-03 NOTE — Telephone Encounter (Signed)
-----   Message from Wende Bushy, MD sent at 05/03/2016 11:43 AM EST ----- No ischemia. Last echo was beginning of this year. Would recommend to repeat echocardiogram to reevaluate ejection fraction.

## 2016-05-03 NOTE — Telephone Encounter (Signed)
Reviewed results of stress test and recommendations for repeat echocardiogram. He verbalized understanding and transferred him to scheduling to set up appointment for echo. He was appreciative for the call and had no further questions at this time.

## 2016-05-03 NOTE — Telephone Encounter (Signed)
Pt called and stated he was cleared by Dr Leta Baptist but he still has not received his CPAP machine. He would like to know if he should reschedule his appt with [place] until he gets the CPAP.

## 2016-05-05 ENCOUNTER — Ambulatory Visit: Payer: Commercial Managed Care - HMO | Admitting: Family Medicine

## 2016-05-22 ENCOUNTER — Ambulatory Visit (INDEPENDENT_AMBULATORY_CARE_PROVIDER_SITE_OTHER): Payer: Commercial Managed Care - HMO

## 2016-05-22 ENCOUNTER — Ambulatory Visit: Payer: Self-pay | Admitting: Family Medicine

## 2016-05-22 ENCOUNTER — Other Ambulatory Visit: Payer: Self-pay

## 2016-05-22 DIAGNOSIS — R0600 Dyspnea, unspecified: Secondary | ICD-10-CM | POA: Diagnosis not present

## 2016-05-23 ENCOUNTER — Other Ambulatory Visit: Payer: Self-pay | Admitting: Family Medicine

## 2016-05-23 ENCOUNTER — Ambulatory Visit: Payer: Commercial Managed Care - HMO | Admitting: Family Medicine

## 2016-05-23 ENCOUNTER — Encounter: Payer: Self-pay | Admitting: Family Medicine

## 2016-05-23 ENCOUNTER — Ambulatory Visit (INDEPENDENT_AMBULATORY_CARE_PROVIDER_SITE_OTHER): Payer: Commercial Managed Care - HMO | Admitting: Family Medicine

## 2016-05-23 VITALS — BP 113/79 | HR 90 | Temp 98.6°F | Wt 277.5 lb

## 2016-05-23 DIAGNOSIS — F5101 Primary insomnia: Secondary | ICD-10-CM | POA: Diagnosis not present

## 2016-05-23 DIAGNOSIS — Z01812 Encounter for preprocedural laboratory examination: Secondary | ICD-10-CM

## 2016-05-23 DIAGNOSIS — Z01818 Encounter for other preprocedural examination: Secondary | ICD-10-CM | POA: Diagnosis not present

## 2016-05-23 DIAGNOSIS — M25551 Pain in right hip: Secondary | ICD-10-CM

## 2016-05-23 DIAGNOSIS — G4733 Obstructive sleep apnea (adult) (pediatric): Secondary | ICD-10-CM | POA: Diagnosis not present

## 2016-05-23 DIAGNOSIS — G894 Chronic pain syndrome: Secondary | ICD-10-CM

## 2016-05-23 MED ORDER — HYDROCODONE-ACETAMINOPHEN 10-325 MG PO TABS: 1.0000 | ORAL_TABLET | Freq: Four times a day (QID) | ORAL | 0 refills | Status: DC | PRN

## 2016-05-23 MED ORDER — CLONAZEPAM 0.5 MG PO TABS: ORAL_TABLET | ORAL | 0 refills | Status: DC

## 2016-05-23 NOTE — Assessment & Plan Note (Signed)
To have hip replacement. Scheduled for 05/29/16.

## 2016-05-23 NOTE — Assessment & Plan Note (Signed)
Needs to come in monthly for Rx for now. He will need to see pain management as we will only do this for another 1 month- he is aware.

## 2016-05-23 NOTE — Progress Notes (Addendum)
BP 113/79 (BP Location: Left Arm, Patient Position: Sitting, Cuff Size: Large)   Pulse 90   Temp 98.6 F (37 C)   Wt 277 lb 8 oz (125.9 kg)   SpO2 97%   BMI 36.41 kg/m    Subjective:    Patient ID: Rickey Gottron., male    DOB: [birth year], 55 y.o.   MRN: [id number]  HPI: Rickey Castellano. is a 55 y.o. male  Chief Complaint  Patient presents with  . surgical clearance    05/29/16  . Pain    Patient states that he needs a refill on his pain medications   Here today for surgical clearance. Went and got his CPAP this morning, has never had any problems with surgery in the past, has had surgery about 7 months ago. No one in his family has had any problems. He had no trouble being intubated. He didn't have any problems being extubated. He did wake up under conscious sedation in the past and woke up, has never had any problems with the anesthesia.  CHRONIC PAIN- Saw [facility] 03/22/16 and to have a total hip replacement done on 05/29/16 Present dose: 40 Morphine equivalents Pain control status: exacerbated again since last visit Duration: chronic Location: low back, R hip, bilateral knees Quality: dull, aching, sore and stabbing Current Pain Level: severe Previous Pain Level: severe Breakthrough pain: yes Benefit from narcotic medications: Unsure What Activities task can be accomplished with current medication?: Able to take care of self and work Interested in Careers adviser off narcotics:no  Stool softners/OTC fiber: no  Previous pain specialty evaluation: no Non-narcotic analgesic meds: yes- had been on gabapentin, didn't like it, made him sleepy. Weaned off at a previousvisit Narcotic contract: yes   INSOMNIA- got his CPAP This morning. Has not slept with it yet. Duration:chronic Satisfied with sleep quality: no Difficulty falling asleep: no Difficulty staying asleep: yes Waking a few hours after sleep onset: yes Early morning awakenings: no Daytime  hypersomnolence: yes Wakes feeling refreshed: no Good sleep hygiene: no Apnea: yes- hasn't gotten his CPAP yet Snoring: yes Depressed/anxious mood: no Recent stress: no Restless legs/nocturnal leg cramps: no Chronic pain/arthritis:yes History of sleep study: yes Treatments attempted: melatonin, uinsom, benadryl and ambien  Relevant past medical, surgical, family and social history reviewed and updated as indicated. Interim medical history since our last visit reviewed. Allergies and medications reviewed and updated.  Review of Systems  Constitutional: Negative.   Respiratory: Negative.   Cardiovascular: Negative.   Musculoskeletal: Positive for arthralgias, back pain, gait problem, joint swelling and myalgias. Negative for neck pain and neck stiffness.  Psychiatric/Behavioral: Positive for sleep disturbance. Negative for agitation, behavioral problems, confusion, decreased concentration, dysphoric mood, hallucinations, self-injury and suicidal ideas. The patient is not nervous/anxious and is not hyperactive.     Per HPI unless specifically indicated above     Objective:    BP 113/79 (BP Location: Left Arm, Patient Position: Sitting, Cuff Size: Large)   Pulse 90   Temp 98.6 F (37 C)   Wt 277 lb 8 oz (125.9 kg)   SpO2 97%   BMI 36.41 kg/m   Wt Readings from Last 3 Encounters:  05/23/16 277 lb 8 oz (125.9 kg)  05/03/16 278 lb 12.8 oz (126.5 kg)  04/19/16 282 lb 6.4 oz (128.1 kg)    Physical Exam  Constitutional: He is oriented to person, place, and time. He appears well-developed and well-nourished. No distress.  HENT:  Head: Normocephalic and atraumatic.  Right  Ear: Hearing normal.  Left Ear: Hearing normal.  Nose: Nose normal.  Eyes: Conjunctivae and lids are normal. Right eye exhibits no discharge. Left eye exhibits no discharge. No scleral icterus.  Pulmonary/Chest: Effort normal. No respiratory distress.  Musculoskeletal: Normal range of motion.  Neurological:  He is alert and oriented to person, place, and time.  Skin: Skin is intact. No rash noted.  Psychiatric: He has a normal mood and affect. His speech is normal and behavior is normal. Judgment and thought content normal. Cognition and memory are normal.    Results for orders placed or performed during the hospital encounter of 04/28/16  NM Myocar Multi W/Spect W/Wall Motion / EF  Result Value Ref Range   Rest HR 64 bpm   Rest BP 1,129/70 mmHg   Peak HR 81 bpm   Peak BP 120/68 mmHg   SSS 5    SRS 8    SDS 1    TID 1.08    LV sys vol 90 mL   LV dias vol 163 62 - 150 mL      Assessment & Plan:   Problem List Items Addressed This Visit      Respiratory   OSA (obstructive sleep apnea)    Picked up his CPAP this AM. Has not used it yet. To make anesthesia aware of his OSA when he goes for surgery and bring his CPAP with him.         Other   Insomnia    Refill of klonopin given today, to last 2 months. Got his CPAP today. Has not used it yet- will check back in with him in 1 month about his insomnia.       Chronic pain    Needs to come in monthly for Rx for now. He will need to see pain management as we will only do this for another 1 month- he is aware.       Relevant Medications   clonazePAM (KLONOPIN) 0.5 MG tablet   HYDROcodone-acetaminophen (NORCO) 10-325 MG tablet   Right hip pain    To have hip replacement. Scheduled for 05/29/16.        Other Visit Diagnoses    Preop exam for internal medicine    -  Primary   Should be cleared from our perspective pending labs and cardiology approval. To bring CPAP with him and make anesthesiology aware of OSA.      05/24/16 8:02AM- pending review of his labs today, Rickey Glover is cleared for surgery pending cardiology approval.  Follow up plan: Return in about 4 weeks (around 06/20/2016).

## 2016-05-23 NOTE — Assessment & Plan Note (Signed)
Picked up his CPAP this AM. Has not used it yet. To make anesthesia aware of his OSA when he goes for surgery and bring his CPAP with him.

## 2016-05-23 NOTE — Assessment & Plan Note (Signed)
Refill of klonopin given today, to last 2 months. Got his CPAP today. Has not used it yet- will check back in with him in 1 month about his insomnia.

## 2016-05-24 ENCOUNTER — Telehealth: Payer: Self-pay | Admitting: Family Medicine

## 2016-05-24 LAB — COMPREHENSIVE METABOLIC PANEL
ALK PHOS: 74 IU/L (ref 39–117)
ALT: 13 IU/L (ref 0–44)
AST: 12 IU/L (ref 0–40)
Albumin/Globulin Ratio: 2 (ref 1.2–2.2)
Albumin: 4.7 g/dL (ref 3.5–5.5)
BILIRUBIN TOTAL: 0.4 mg/dL (ref 0.0–1.2)
BUN/Creatinine Ratio: 13 (ref 9–20)
BUN: 14 mg/dL (ref 6–24)
CHLORIDE: 102 mmol/L (ref 96–106)
CO2: 26 mmol/L (ref 18–29)
CREATININE: 1.06 mg/dL (ref 0.76–1.27)
Calcium: 9.5 mg/dL (ref 8.7–10.2)
GFR calc Af Amer: 92 mL/min/{1.73_m2} (ref >59)
GFR calc non Af Amer: 80 mL/min/{1.73_m2} (ref >59)
GLOBULIN, TOTAL: 2.3 g/dL (ref 1.5–4.5)
GLUCOSE: 125 mg/dL — AB (ref 65–99)
Potassium: 4.2 mmol/L (ref 3.5–5.2)
SODIUM: 143 mmol/L (ref 134–144)
Total Protein: 7 g/dL (ref 6.0–8.5)

## 2016-05-24 LAB — CBC WITH DIFFERENTIAL/PLATELET
BASOS ABS: 0 10*3/uL (ref 0.0–0.2)
Basos: 0 %
EOS (ABSOLUTE): 0.2 10*3/uL (ref 0.0–0.4)
Eos: 1 %
HEMATOCRIT: 42.8 % (ref 37.5–51.0)
Hemoglobin: 14.2 g/dL (ref 13.0–17.7)
Immature Grans (Abs): 0 10*3/uL (ref 0.0–0.1)
Immature Granulocytes: 0 %
Lymphocytes Absolute: 4.2 10*3/uL — ABNORMAL HIGH (ref 0.7–3.1)
Lymphs: 35 %
MCH: 27.8 pg (ref 26.6–33.0)
MCHC: 33.2 g/dL (ref 31.5–35.7)
MCV: 84 fL (ref 79–97)
MONOS ABS: 0.5 10*3/uL (ref 0.1–0.9)
Monocytes: 4 %
Neutrophils Absolute: 7.3 10*3/uL — ABNORMAL HIGH (ref 1.4–7.0)
Neutrophils: 60 %
Platelets: 329 10*3/uL (ref 150–379)
RBC: 5.1 x10E6/uL (ref 4.14–5.80)
RDW: 14.6 % (ref 12.3–15.4)
WBC: 12.2 10*3/uL — AB (ref 3.4–10.8)

## 2016-05-24 LAB — PROTIME-INR
INR: 0.9 (ref 0.8–1.2)
Prothrombin Time: 9.5 s (ref 9.1–12.0)

## 2016-05-24 LAB — APTT: APTT: 26 s (ref 24–33)

## 2016-05-26 ENCOUNTER — Telehealth: Payer: Self-pay | Admitting: Cardiology

## 2016-05-26 ENCOUNTER — Telehealth: Payer: Self-pay | Admitting: Family Medicine

## 2016-05-26 NOTE — Telephone Encounter (Signed)
Faxed OV notes, EKG and echo results to [facility], attn: Theodora Blow, [phone]

## 2016-05-26 NOTE — Telephone Encounter (Signed)
[employer] notified that we will not handle his STD paperwork, and that they need to contact [facility]

## 2016-05-26 NOTE — Telephone Encounter (Signed)
Faxed cardiac clearance and last OV notes for pt 12/11 right total hip replacement to [facility], [phone]

## 2016-05-26 NOTE — Telephone Encounter (Signed)
[facility] called stating they need patients EKG and Stress test results faxed over ASAP.  Thanks  [phone]

## 2016-05-26 NOTE — Telephone Encounter (Signed)
Yesa from [place] called requesting information on this patient She needs the surgery date, ICD10, and CPT Code [phone]

## 2016-05-26 NOTE — Telephone Encounter (Signed)
Notified that they need to get this from [place].

## 2016-05-29 DIAGNOSIS — I10 Essential (primary) hypertension: Secondary | ICD-10-CM | POA: Diagnosis not present

## 2016-05-29 DIAGNOSIS — Z6837 Body mass index (BMI) 37.0-37.9, adult: Secondary | ICD-10-CM | POA: Diagnosis not present

## 2016-05-29 DIAGNOSIS — M25551 Pain in right hip: Secondary | ICD-10-CM | POA: Diagnosis not present

## 2016-05-29 DIAGNOSIS — E669 Obesity, unspecified: Secondary | ICD-10-CM | POA: Diagnosis not present

## 2016-05-29 DIAGNOSIS — E785 Hyperlipidemia, unspecified: Secondary | ICD-10-CM | POA: Diagnosis not present

## 2016-05-29 DIAGNOSIS — E119 Type 2 diabetes mellitus without complications: Secondary | ICD-10-CM | POA: Diagnosis not present

## 2016-05-29 DIAGNOSIS — Z4789 Encounter for other orthopedic aftercare: Secondary | ICD-10-CM | POA: Diagnosis not present

## 2016-05-29 DIAGNOSIS — R2689 Other abnormalities of gait and mobility: Secondary | ICD-10-CM | POA: Diagnosis not present

## 2016-05-29 DIAGNOSIS — G4733 Obstructive sleep apnea (adult) (pediatric): Secondary | ICD-10-CM | POA: Diagnosis not present

## 2016-05-29 DIAGNOSIS — I5032 Chronic diastolic (congestive) heart failure: Secondary | ICD-10-CM | POA: Diagnosis not present

## 2016-05-29 DIAGNOSIS — H11009 Unspecified pterygium of unspecified eye: Secondary | ICD-10-CM | POA: Diagnosis not present

## 2016-05-29 DIAGNOSIS — M1611 Unilateral primary osteoarthritis, right hip: Secondary | ICD-10-CM | POA: Diagnosis not present

## 2016-05-29 DIAGNOSIS — M25651 Stiffness of right hip, not elsewhere classified: Secondary | ICD-10-CM | POA: Diagnosis not present

## 2016-05-29 DIAGNOSIS — F17210 Nicotine dependence, cigarettes, uncomplicated: Secondary | ICD-10-CM | POA: Diagnosis not present

## 2016-05-30 ENCOUNTER — Other Ambulatory Visit: Payer: Self-pay

## 2016-05-30 DIAGNOSIS — M25651 Stiffness of right hip, not elsewhere classified: Secondary | ICD-10-CM | POA: Diagnosis not present

## 2016-05-30 DIAGNOSIS — M25551 Pain in right hip: Secondary | ICD-10-CM | POA: Diagnosis not present

## 2016-05-30 DIAGNOSIS — R2689 Other abnormalities of gait and mobility: Secondary | ICD-10-CM | POA: Diagnosis not present

## 2016-05-30 NOTE — Telephone Encounter (Signed)
Done

## 2016-06-01 ENCOUNTER — Telehealth: Payer: Self-pay | Admitting: Family Medicine

## 2016-06-01 ENCOUNTER — Other Ambulatory Visit: Payer: Self-pay | Admitting: Family Medicine

## 2016-06-01 DIAGNOSIS — H11009 Unspecified pterygium of unspecified eye: Secondary | ICD-10-CM

## 2016-06-01 NOTE — Telephone Encounter (Signed)
Patient had a pterygium during his surgery, needs to see opthalmology ASAP- can't see [facility] due to insurance. Referral put in, please try to get him into [facility]

## 2016-06-01 NOTE — Telephone Encounter (Signed)
Patient has medication at his pharmacy.

## 2016-06-01 NOTE — Telephone Encounter (Signed)
Pt needs a refill on quinapril (ACCUPRIL) 40 MG tablet and spironolactone (ALDACTONE) 25 MG tablet sent to [facility] [address]

## 2016-06-01 NOTE — Telephone Encounter (Signed)
Appointment scheduled for 06/02/16 @ 9:45 with Dr.Vin at [facility] location.  Patient's girlfriend notified.

## 2016-06-02 DIAGNOSIS — E119 Type 2 diabetes mellitus without complications: Secondary | ICD-10-CM | POA: Diagnosis not present

## 2016-06-02 DIAGNOSIS — M25651 Stiffness of right hip, not elsewhere classified: Secondary | ICD-10-CM | POA: Diagnosis not present

## 2016-06-02 DIAGNOSIS — M25551 Pain in right hip: Secondary | ICD-10-CM | POA: Diagnosis not present

## 2016-06-05 ENCOUNTER — Telehealth: Payer: Self-pay | Admitting: Family Medicine

## 2016-06-05 DIAGNOSIS — R2689 Other abnormalities of gait and mobility: Secondary | ICD-10-CM | POA: Diagnosis not present

## 2016-06-05 DIAGNOSIS — M25551 Pain in right hip: Secondary | ICD-10-CM | POA: Diagnosis not present

## 2016-06-05 NOTE — Telephone Encounter (Signed)
Will need to discuss this with his surgeon as they just did the procedure.

## 2016-06-05 NOTE — Telephone Encounter (Signed)
Pt called and stated while in PT anti-inflammatory medication was suggested and he would like to know if he could have something sent in to [facility] [address]

## 2016-06-05 NOTE — Telephone Encounter (Signed)
Patient notified

## 2016-06-07 DIAGNOSIS — M25651 Stiffness of right hip, not elsewhere classified: Secondary | ICD-10-CM | POA: Diagnosis not present

## 2016-06-07 DIAGNOSIS — M25551 Pain in right hip: Secondary | ICD-10-CM | POA: Diagnosis not present

## 2016-06-14 DIAGNOSIS — Z96641 Presence of right artificial hip joint: Secondary | ICD-10-CM | POA: Diagnosis not present

## 2016-06-14 DIAGNOSIS — R2689 Other abnormalities of gait and mobility: Secondary | ICD-10-CM | POA: Diagnosis not present

## 2016-06-14 DIAGNOSIS — M25551 Pain in right hip: Secondary | ICD-10-CM | POA: Diagnosis not present

## 2016-06-14 DIAGNOSIS — Z4789 Encounter for other orthopedic aftercare: Secondary | ICD-10-CM | POA: Diagnosis not present

## 2016-06-15 ENCOUNTER — Encounter: Payer: Self-pay | Admitting: Podiatry

## 2016-06-15 ENCOUNTER — Ambulatory Visit (INDEPENDENT_AMBULATORY_CARE_PROVIDER_SITE_OTHER): Payer: Commercial Managed Care - HMO | Admitting: Podiatry

## 2016-06-15 DIAGNOSIS — M79672 Pain in left foot: Secondary | ICD-10-CM | POA: Diagnosis not present

## 2016-06-15 DIAGNOSIS — M79671 Pain in right foot: Secondary | ICD-10-CM

## 2016-06-15 DIAGNOSIS — E1142 Type 2 diabetes mellitus with diabetic polyneuropathy: Secondary | ICD-10-CM

## 2016-06-15 DIAGNOSIS — B351 Tinea unguium: Secondary | ICD-10-CM

## 2016-06-15 NOTE — Progress Notes (Signed)
Subjective: 55 y.o. returns the office today for painful, elongated, thickened toenails which he cannot trim himself. Denies any redness or drainage around the nails. He just had a hip replacement 3 weeks ago. He did not start the urea cream for the thick toenails. Denies any acute changes since last appointment and no new complaints today. Denies any systemic complaints such as fevers, chills, nausea, vomiting.   Objective: AAO 3, NAD DP/PT pulses palpable however PT 1/4, CRT less than 3 seconds; chronic ankle edema is present bilaterally. Nails hypertrophic, dystrophic, elongated, brittle, discolored 10. There is tenderness overlying the nails 1-5 bilaterally. There is no surrounding erythema or drainage along the nail sites. No open lesions or pre-ulcerative lesions are identified. No other areas of tenderness bilateral lower extremities. No overlying edema, erythema, increased warmth. No pain with calf compression, swelling, warmth, erythema.  Assessment: Patient presents with symptomatic onychomycosis  Plan: -Treatment options including alternatives, risks, complications were discussed -Nails sharply debrided 10 without complication/bleeding.  -He will start the urea cream.  -Discussed daily foot inspection. If there are any changes, to call the office immediately.  -Follow-up in 3 months or sooner if any problems are to arise. In the meantime, encouraged to call the office with any questions, concerns, changes symptoms.  Celesta Gentile, DPM

## 2016-06-16 DIAGNOSIS — R2689 Other abnormalities of gait and mobility: Secondary | ICD-10-CM | POA: Diagnosis not present

## 2016-06-16 DIAGNOSIS — M25551 Pain in right hip: Secondary | ICD-10-CM | POA: Diagnosis not present

## 2016-06-20 DIAGNOSIS — Z96641 Presence of right artificial hip joint: Secondary | ICD-10-CM | POA: Diagnosis not present

## 2016-06-20 DIAGNOSIS — M25551 Pain in right hip: Secondary | ICD-10-CM | POA: Diagnosis not present

## 2016-06-23 ENCOUNTER — Ambulatory Visit (INDEPENDENT_AMBULATORY_CARE_PROVIDER_SITE_OTHER): Payer: Medicare HMO | Admitting: Family Medicine

## 2016-06-23 ENCOUNTER — Encounter: Payer: Self-pay | Admitting: Family Medicine

## 2016-06-23 VITALS — BP 105/66 | HR 91 | Temp 98.1°F | Ht 73.0 in | Wt 288.2 lb

## 2016-06-23 DIAGNOSIS — L723 Sebaceous cyst: Secondary | ICD-10-CM

## 2016-06-23 DIAGNOSIS — G4733 Obstructive sleep apnea (adult) (pediatric): Secondary | ICD-10-CM | POA: Diagnosis not present

## 2016-06-23 NOTE — Patient Instructions (Addendum)
Epidermal Cyst An epidermal cyst is usually a small, painless lump under the skin. Cysts often occur on the face, neck, stomach, chest, or genitals. The cyst may be filled with a bad smelling paste. Do not pop your cyst. Popping the cyst can cause pain and puffiness (swelling). HOME CARE   Only take medicines as told by your doctor.  Take your medicine (antibiotics) as told. Finish it even if you start to feel better. GET HELP RIGHT AWAY IF:  Your cyst is tender, red, or puffy.  You are not getting better, or you are getting worse.  You have any questions or concerns. MAKE SURE YOU:  Understand these instructions.  Will watch your condition.  Will get help right away if you are not doing well or get worse. This information is not intended to replace advice given to you by your health care provider. Make sure you discuss any questions you have with your health care provider. Document Released: 07/13/2004 Document Revised: 12/05/2011 Document Reviewed: 04/07/2015 Elsevier Interactive Patient Education  2017 Elsevier Inc.  

## 2016-06-23 NOTE — Progress Notes (Signed)
BP 105/66 (BP Location: Left Arm, Patient Position: Sitting, Cuff Size: Normal)   Pulse 91   Temp 98.1 F (36.7 C)   Ht 6\' 1"  (1.854 m)   Wt 288 lb 3.2 oz (130.7 kg)   SpO2 96%   BMI 38.02 kg/m    Subjective:    Patient ID: Rickey Gottron., male    DOB: 1960/12/21, 56 y.o.   MRN: [id number]  HPI: Rickey Glover. is a 56 y.o. male  Chief Complaint  Patient presents with  . Cyst    Been ruptured once before by old PCP. Just has came back. White on the top.    LUMP Duration: several years Location: anterior neck Onset: gradual Painful: no Discomfort: no Status:  bigger Trauma: no Redness: no Bruising: no Recent infection: no Swollen lymph nodes: no Requesting removal: yes History of cancer: no Family history of cancer: no History of the same: no  Relevant past medical, surgical, family and social history reviewed and updated as indicated. Interim medical history since our last visit reviewed. Allergies and medications reviewed and updated.  Review of Systems  Constitutional: Negative.   Respiratory: Negative.   Cardiovascular: Negative.   Skin: Negative for color change, pallor, rash and wound.  Psychiatric/Behavioral: Negative.     Per HPI unless specifically indicated above     Objective:    BP 105/66 (BP Location: Left Arm, Patient Position: Sitting, Cuff Size: Normal)   Pulse 91   Temp 98.1 F (36.7 C)   Ht 6\' 1"  (1.854 m)   Wt 288 lb 3.2 oz (130.7 kg)   SpO2 96%   BMI 38.02 kg/m   Wt Readings from Last 3 Encounters:  06/23/16 288 lb 3.2 oz (130.7 kg)  05/23/16 277 lb 8 oz (125.9 kg)  05/03/16 278 lb 12.8 oz (126.5 kg)    Physical Exam  Constitutional: He is oriented to person, place, and time. He appears well-developed and well-nourished. No distress.  HENT:  Head: Normocephalic and atraumatic.  Right Ear: Hearing normal.  Left Ear: Hearing normal.  Nose: Nose normal.  Eyes: Conjunctivae and lids are normal. Right eye  exhibits no discharge. Left eye exhibits no discharge. No scleral icterus.  Pulmonary/Chest: Effort normal. No respiratory distress.  Musculoskeletal: Normal range of motion.  Neurological: He is alert and oriented to person, place, and time.  Skin: Skin is warm, dry and intact. No rash noted. No erythema. No pallor.  1cm sebaceous cyst with some skin break down over the top on anterior neck just superior to the sternal notch. No tenderness, no heat, no swelling, no redness.   Psychiatric: He has a normal mood and affect. His speech is normal and behavior is normal. Judgment and thought content normal. Cognition and memory are normal.  Nursing note and vitals reviewed.   Results for orders placed or performed in visit on 05/23/16  CBC with Differential/Platelet  Result Value Ref Range   WBC 12.2 (H) 3.4 - 10.8 x10E3/uL   RBC 5.10 4.14 - 5.80 x10E6/uL   Hemoglobin 14.2 13.0 - 17.7 g/dL   Hematocrit 42.8 37.5 - 51.0 %   MCV 84 79 - 97 fL   MCH 27.8 26.6 - 33.0 pg   MCHC 33.2 31.5 - 35.7 g/dL   RDW 14.6 12.3 - 15.4 %   Platelets 329 150 - 379 x10E3/uL   Neutrophils 60 Not Estab. %   Lymphs 35 Not Estab. %   Monocytes 4 Not Estab. %   Eos  1 Not Estab. %   Basos 0 Not Estab. %   Neutrophils Absolute 7.3 (H) 1.4 - 7.0 x10E3/uL   Lymphocytes Absolute 4.2 (H) 0.7 - 3.1 x10E3/uL   Monocytes Absolute 0.5 0.1 - 0.9 x10E3/uL   EOS (ABSOLUTE) 0.2 0.0 - 0.4 x10E3/uL   Basophils Absolute 0.0 0.0 - 0.2 x10E3/uL   Immature Granulocytes 0 Not Estab. %   Immature Grans (Abs) 0.0 0.0 - 0.1 x10E3/uL  Comprehensive metabolic panel  Result Value Ref Range   Glucose 125 (H) 65 - 99 mg/dL   BUN 14 6 - 24 mg/dL   Creatinine, Ser 1.06 0.76 - 1.27 mg/dL   GFR calc non Af Amer 80 >59 mL/min/1.73   GFR calc Af Amer 92 >59 mL/min/1.73   BUN/Creatinine Ratio 13 9 - 20   Sodium 143 134 - 144 mmol/L   Potassium 4.2 3.5 - 5.2 mmol/L   Chloride 102 96 - 106 mmol/L   CO2 26 18 - 29 mmol/L   Calcium 9.5 8.7  - 10.2 mg/dL   Total Protein 7.0 6.0 - 8.5 g/dL   Albumin 4.7 3.5 - 5.5 g/dL   Globulin, Total 2.3 1.5 - 4.5 g/dL   Albumin/Globulin Ratio 2.0 1.2 - 2.2   Bilirubin Total 0.4 0.0 - 1.2 mg/dL   Alkaline Phosphatase 74 39 - 117 IU/L   AST 12 0 - 40 IU/L   ALT 13 0 - 44 IU/L  PTT  Result Value Ref Range   aPTT 26 24 - 33 sec  INR/PT  Result Value Ref Range   INR 0.9 0.8 - 1.2   Prothrombin Time 9.5 9.1 - 12.0 sec      Assessment & Plan:   Problem List Items Addressed This Visit    None    Visit Diagnoses    Sebaceous cyst    -  Primary   Not currently infected. Given the location on the anterior neck will refer him to general surgery for removal. Call with any concerns.    Relevant Orders   Ambulatory referral to General Surgery       Follow up plan: Return 1-2 weeks, for DM follow up.

## 2016-06-27 NOTE — Telephone Encounter (Signed)
ERROR

## 2016-06-28 ENCOUNTER — Other Ambulatory Visit: Payer: Self-pay

## 2016-06-29 DIAGNOSIS — M25551 Pain in right hip: Secondary | ICD-10-CM | POA: Diagnosis not present

## 2016-06-29 DIAGNOSIS — Z96641 Presence of right artificial hip joint: Secondary | ICD-10-CM | POA: Diagnosis not present

## 2016-06-30 ENCOUNTER — Ambulatory Visit: Payer: Medicare HMO | Admitting: Surgery

## 2016-06-30 ENCOUNTER — Telehealth: Payer: Self-pay

## 2016-06-30 ENCOUNTER — Encounter: Payer: Self-pay | Admitting: Surgery

## 2016-06-30 ENCOUNTER — Ambulatory Visit (INDEPENDENT_AMBULATORY_CARE_PROVIDER_SITE_OTHER): Payer: Medicare HMO | Admitting: Surgery

## 2016-06-30 VITALS — BP 122/73 | HR 98 | Temp 98.3°F | Ht 73.0 in | Wt 283.8 lb

## 2016-06-30 VITALS — BP 122/73 | HR 98 | Temp 98.3°F | Ht 73.0 in | Wt 283.0 lb

## 2016-06-30 DIAGNOSIS — L723 Sebaceous cyst: Secondary | ICD-10-CM

## 2016-06-30 DIAGNOSIS — L720 Epidermal cyst: Secondary | ICD-10-CM | POA: Diagnosis not present

## 2016-06-30 MED ORDER — HYDROCODONE-ACETAMINOPHEN 5-325 MG PO TABS: 1.0000 | ORAL_TABLET | Freq: Four times a day (QID) | ORAL | 0 refills | Status: DC | PRN

## 2016-06-30 NOTE — Progress Notes (Signed)
  Procedure Date:  06/30/2016  Pre-operative Diagnosis:  Sebaceous cyst of anterior neck  Post-operative Diagnosis:  Sebaceous cyst of anterior neck  Procedure:  Excision of sebaceous cyst  Surgeon:  Melvyn Neth, MD  Anesthesia:  10 ml of lidocaine with epi  Estimated Blood Loss:  1 ml  Specimens:  Sebaceous cyst  Complications:  None  Indications for Procedure:  This is a 56 y.o. male who presents with a recurrent sebaceous cyst s/p I&D 2 years ago.  The risks of bleeding, abscess or infection, injury to surrounding structures, and need for further procedures were all discussed with the patient and was willing to proceed.  Description of Procedure: The patient was correctly identified at bedside.  The patient was placed supine.  Appropriate time-outs were performed.  The patient's neck and upper chest were prepped and draped in a sterile fashion.  Local anesthetic was infused around the cyst.  The cyst measured 1.2 cm in size. An elliptical incision was made surrounding the cyst, measuring 3 cm x 1 cm.  Using the scalpel, the cyst and surrounding subcutaneous tissue were dissected free and intact.  Pressure was applied for hemostasis of the wound bed.  The wound was then closed in two layers using 3-0 Vicryl and 4-0 Monocryl.  The incision was cleaned and dried and sealed with DermaBond.  The patient tolerated the procedure well and all counts were correct at the end of the case.   Melvyn Neth, MD

## 2016-06-30 NOTE — Telephone Encounter (Signed)
Spoke with Jackelyn Poling at [facility] at this time for pick up. She stated she will arrange pick up of specimen on Monday 07/03/16.

## 2016-06-30 NOTE — Patient Instructions (Signed)
We will see you back at 2:45 pm today for your procedure.

## 2016-06-30 NOTE — Patient Instructions (Addendum)
Please do not apply any thing to your incision area. You may shower tomorrow but do not rub the area.  Please see your follow up appointment listed below.Please call our office if you have any questions or concerns.

## 2016-06-30 NOTE — Progress Notes (Signed)
06/30/2016  Reason for Visit:  Sebaceous cyst of the anterior neck  History of Present Illness: Rickey Glover. is a 56 y.o. male referred by his PCP for a sebaceous cyst of the anterior neck. He reports that 2 years ago he developed a cyst in the anterior neck above the sternal notch and this was I & D'd by his PCP. However now has grown back again and is becoming very annoying to the patient. He denies any tenderness or drainage or any concerns for infection. However it is irritating and sometimes can get caught up on his clothes. He denies having any fevers, chills, chest pain, shortness of breath. He denies any cellulitis or swelling of the neck.  Of note he recently had right hip surgery and is due to be back at work this coming Monday  Past Medical History: Past Medical History:  Diagnosis Date  . Chronic back pain   . DDD (degenerative disc disease), lumbar   . Diabetes mellitus without complication ([place])   . Hip pain   . Hyperlipidemia   . Hypertension   . Insomnia   . Knee pain, left   . LAP-BAND surgery status   . Sciatica of right side   . Sleep apnea    new DX - has not been for CPAP titration yet  . Vitamin D deficiency      Past Surgical History: Past Surgical History:  Procedure Laterality Date  . COLONOSCOPY WITH PROPOFOL N/A 07/15/2015   Procedure: COLONOSCOPY WITH PROPOFOL;  Surgeon: Lucilla Lame, MD;  Location: [facility];  Service: Endoscopy;  Laterality: N/A;  Diabetic - oral meds Sleep apnea  . JOINT REPLACEMENT Right 2016  . lap band surgery    . LUMBAR SPINE SURGERY  April 2017    Home Medications: Prior to Admission medications   Medication Sig Start Date End Date Taking? Authorizing Provider  amLODipine (NORVASC) 10 MG tablet Take 1 tablet (10 mg total) by mouth daily. 03/06/16  Yes Megan P Johnson, DO  clonazePAM (KLONOPIN) 0.5 MG tablet 1 tablet at bedtime as needed (try every other day at least) 05/23/16  Yes Megan P Johnson, DO   glimepiride (AMARYL) 4 MG tablet Take 1 tablet (4 mg total) by mouth 2 (two) times daily. 03/16/16  Yes Megan P Johnson, DO  metFORMIN (GLUCOPHAGE-XR) 500 MG 24 hr tablet Take 1 tablet (500 mg total) by mouth 2 (two) times daily with a meal. 03/16/16  Yes Megan P Johnson, DO  oxyCODONE (OXY IR/ROXICODONE) 5 MG immediate release tablet  06/20/16  Yes Historical Provider, MD  quinapril (ACCUPRIL) 40 MG tablet Take 1 tablet (40 mg total) by mouth daily. 03/16/16  Yes Megan P Johnson, DO  spironolactone (ALDACTONE) 25 MG tablet Take 1 tablet (25 mg total) by mouth daily. 03/16/16  Yes Megan Annia Friendly, DO    Allergies: Allergies  Allergen Reactions  . Percocet [Oxycodone-Acetaminophen]     Social History:  reports that he has been smoking Cigarettes.  He has a 10.00 pack-year smoking history. He has never used smokeless tobacco. He reports that he does not drink alcohol or use drugs.   Family History: Family History  Problem Relation Age of Onset  . Aneurysm Mother     Brain  . Stroke Maternal Grandmother   . Stroke Paternal Grandmother     Review of Systems: Review of Systems  Constitutional: Negative for chills and fever.  HENT: Negative for hearing loss.   Eyes: Negative for blurred vision.  Respiratory: Negative for cough and shortness of breath.   Cardiovascular: Negative for chest pain and leg swelling.  Gastrointestinal: Negative for abdominal pain, heartburn, nausea and vomiting.  Genitourinary: Negative for dysuria and hematuria.  Musculoskeletal: Positive for joint pain (right hip pain after surgery).  Skin: Negative for rash.  Neurological: Negative for dizziness.  Psychiatric/Behavioral: Negative for depression.    Physical Exam BP 122/73   Pulse 98   Temp 98.3 F (36.8 C) (Oral)   Ht 6\' 1"  (1.854 m)   Wt 128.7 kg (283 lb 12.8 oz)   BMI 37.44 kg/m  CONSTITUTIONAL: No acute distress. HEENT:  Normocephalic, atraumatic, extraocular motion intact. NECK: Trachea is  midline, and there is no jugular venous distension.  SKIN: Patient has a 1 cm sebaceous cyst over the anterior neck, about 2 cm superior to the sternal notch. There is no active drainage, no tenderness to palpation, no cellulitis or induration. NEUROLOGIC:  Motor and sensation is grossly normal.  Cranial nerves are grossly intact. PSYCH:  Alert and oriented to person, place and time. Affect is normal.  Laboratory Analysis: No results found for this or any previous visit (from the past 24 hour(s)).  Imaging: No results found.  Assessment and Plan: This is a 56 y.o. male who presents with a recurrent sebaceous cyst of the anterior neck.  Discussed with the patient that this cyst could be excised at an office procedure.  The risks and benefits of the procedure were discussed as well as post-op expectations for the wound and instructions were discussed.  The patient would rather have the cyst removed today so he can get back to work Monday and not have to take additional days off.  He will come back in the afternoon for the procedure.  Face-to-face time spent with the patient and care providers was 30 minutes, with more than 50% of the time spent counseling, educating, and coordinating care of the patient.     Melvyn Neth, [place]

## 2016-07-03 ENCOUNTER — Telehealth: Payer: Self-pay

## 2016-07-03 NOTE — Telephone Encounter (Signed)
Specimen picked up by Jana Half from [facility] 1:30 pm.

## 2016-07-03 NOTE — Telephone Encounter (Signed)
Spoke with Diane at [facility] to ensure pick up of Specimen today.

## 2016-07-04 ENCOUNTER — Ambulatory Visit (INDEPENDENT_AMBULATORY_CARE_PROVIDER_SITE_OTHER): Payer: Medicare HMO | Admitting: Family Medicine

## 2016-07-04 ENCOUNTER — Encounter: Payer: Self-pay | Admitting: Family Medicine

## 2016-07-04 VITALS — BP 121/77 | HR 97 | Temp 98.4°F | Wt 280.7 lb

## 2016-07-04 DIAGNOSIS — E1165 Type 2 diabetes mellitus with hyperglycemia: Secondary | ICD-10-CM | POA: Diagnosis not present

## 2016-07-04 DIAGNOSIS — M1711 Unilateral primary osteoarthritis, right knee: Secondary | ICD-10-CM | POA: Diagnosis not present

## 2016-07-04 DIAGNOSIS — G894 Chronic pain syndrome: Secondary | ICD-10-CM

## 2016-07-04 DIAGNOSIS — F5101 Primary insomnia: Secondary | ICD-10-CM | POA: Diagnosis not present

## 2016-07-04 LAB — BAYER DCA HB A1C WAIVED: HB A1C: 6.5 % (ref <7.0)

## 2016-07-04 MED ORDER — HYDROCODONE-ACETAMINOPHEN 10-325 MG PO TABS: ORAL_TABLET | ORAL | 0 refills | Status: DC

## 2016-07-04 NOTE — Assessment & Plan Note (Signed)
Refill of klonopin given last visit, to last 2 months, not due yet. Having issues with his CPAP. Strongly encouraged him to use it.

## 2016-07-04 NOTE — Progress Notes (Addendum)
BP 121/77 (BP Location: Left Arm, Patient Position: Sitting, Cuff Size: Large)   Pulse 97   Temp 98.4 F (36.9 C)   Wt 280 lb 11.2 oz (127.3 kg)   SpO2 97%   BMI 37.03 kg/m    Subjective:    Patient ID: Rickey Glover., male    DOB: 20-Sep-1960, 56 y.o.   MRN: [id number]  HPI: Rickey Glover. is a 57 y.o. male  Chief Complaint  Patient presents with  . Diabetes  . Insomnia  . Pain   DIABETES Hypoglycemic episodes:no Polydipsia/polyuria: no Visual disturbance: no Chest pain: no Paresthesias: no Glucose Monitoring: no Taking Insulin?: no Blood Pressure Monitoring: not checking Retinal Examination: Up to Date Foot Exam: Done today Diabetic Education: Not Completed Pneumovax: Up to Date Influenza: Up to Date Aspirin: yes   INSOMNIA- doesn't like his CPAP because he's got some claustrophobia with the mask Duration:chronic Satisfied with sleep quality: no Difficulty falling asleep: no Difficulty staying asleep: yes Waking a few hours after sleep onset: yes Early morning awakenings: no Daytime hypersomnolence: yes Wakes feeling refreshed: no Good sleep hygiene: no Apnea: yes- hasn't gotten his CPAP yet Snoring: yes Depressed/anxious mood: no Recent stress: no Restless legs/nocturnal leg cramps: no Chronic pain/arthritis:yes History of sleep study: yes Treatments attempted: melatonin, uinsom, benadryl and ambien  CHRONIC PAIN- had surgery done 05/29/16 (hip replacement). Orthopedics has been writing his post-operative pain medication. Has been taking oxycodone. Was released from his orthopedist on 06/29/16, he is not taking any more pain medicine. He is in pain at night because he can't sleep on his R hip Present dose:  Morphine equivalents Pain control status: better Duration: chronic Location: R hip Quality: dull and aching Current Pain Level: 10/10 Previous Pain Level: 10/10 Breakthrough pain: no Benefit from narcotic medications:  yes What Activities task can be accomplished with current medication?: able to work and do his job Interested in Careers adviser off narcotics:no   Stool softners/OTC fiber: no  Previous pain specialty evaluation: yes Non-narcotic analgesic meds: no Narcotic contract: yes  Relevant past medical, surgical, family and social history reviewed and updated as indicated. Interim medical history since our last visit reviewed. Allergies and medications reviewed and updated.  Review of Systems  Constitutional: Negative.   Respiratory: Negative.   Cardiovascular: Negative.   Musculoskeletal: Positive for arthralgias, back pain and gait problem. Negative for joint swelling, myalgias, neck pain and neck stiffness.  Psychiatric/Behavioral: Negative.     Per HPI unless specifically indicated above     Objective:    BP 121/77 (BP Location: Left Arm, Patient Position: Sitting, Cuff Size: Large)   Pulse 97   Temp 98.4 F (36.9 C)   Wt 280 lb 11.2 oz (127.3 kg)   SpO2 97%   BMI 37.03 kg/m   Wt Readings from Last 3 Encounters:  07/04/16 280 lb 11.2 oz (127.3 kg)  06/30/16 283 lb (128.4 kg)  06/30/16 283 lb 12.8 oz (128.7 kg)    Physical Exam  Constitutional: He is oriented to person, place, and time. He appears well-developed and well-nourished. No distress.  HENT:  Head: Normocephalic and atraumatic.  Right Ear: Hearing normal.  Left Ear: Hearing normal.  Nose: Nose normal.  Eyes: Conjunctivae and lids are normal. Right eye exhibits no discharge. Left eye exhibits no discharge. No scleral icterus.  Cardiovascular: Normal rate, regular rhythm, normal heart sounds and intact distal pulses.  Exam reveals no gallop and no friction rub.   No murmur heard. Pulmonary/Chest: Effort  normal and breath sounds normal. No respiratory distress. He has no wheezes. He has no rales. He exhibits no tenderness.  Musculoskeletal: Normal range of motion.  Neurological: He is alert and oriented to person, place,  and time.  Skin: Skin is warm, dry and intact. No rash noted. No erythema. No pallor.  Psychiatric: He has a normal mood and affect. His speech is normal and behavior is normal. Judgment and thought content normal. Cognition and memory are normal.  Nursing note and vitals reviewed.  Diabetic Foot Exam - Simple   Simple Foot Form Diabetic Foot exam was performed with the following findings:  Yes 07/04/2016  3:18 PM  Visual Inspection No deformities, no ulcerations, no other skin breakdown bilaterally:  Yes Sensation Testing Intact to touch and monofilament testing bilaterally:  Yes Pulse Check Posterior Tibialis and Dorsalis pulse intact bilaterally:  Yes Comments      Results for orders placed or performed in visit on 05/23/16  CBC with Differential/Platelet  Result Value Ref Range   WBC 12.2 (H) 3.4 - 10.8 x10E3/uL   RBC 5.10 4.14 - 5.80 x10E6/uL   Hemoglobin 14.2 13.0 - 17.7 g/dL   Hematocrit 42.8 37.5 - 51.0 %   MCV 84 79 - 97 fL   MCH 27.8 26.6 - 33.0 pg   MCHC 33.2 31.5 - 35.7 g/dL   RDW 14.6 12.3 - 15.4 %   Platelets 329 150 - 379 x10E3/uL   Neutrophils 60 Not Estab. %   Lymphs 35 Not Estab. %   Monocytes 4 Not Estab. %   Eos 1 Not Estab. %   Basos 0 Not Estab. %   Neutrophils Absolute 7.3 (H) 1.4 - 7.0 x10E3/uL   Lymphocytes Absolute 4.2 (H) 0.7 - 3.1 x10E3/uL   Monocytes Absolute 0.5 0.1 - 0.9 x10E3/uL   EOS (ABSOLUTE) 0.2 0.0 - 0.4 x10E3/uL   Basophils Absolute 0.0 0.0 - 0.2 x10E3/uL   Immature Granulocytes 0 Not Estab. %   Immature Grans (Abs) 0.0 0.0 - 0.1 x10E3/uL  Comprehensive metabolic panel  Result Value Ref Range   Glucose 125 (H) 65 - 99 mg/dL   BUN 14 6 - 24 mg/dL   Creatinine, Ser 1.06 0.76 - 1.27 mg/dL   GFR calc non Af Amer 80 >59 mL/min/1.73   GFR calc Af Amer 92 >59 mL/min/1.73   BUN/Creatinine Ratio 13 9 - 20   Sodium 143 134 - 144 mmol/L   Potassium 4.2 3.5 - 5.2 mmol/L   Chloride 102 96 - 106 mmol/L   CO2 26 18 - 29 mmol/L   Calcium 9.5  8.7 - 10.2 mg/dL   Total Protein 7.0 6.0 - 8.5 g/dL   Albumin 4.7 3.5 - 5.5 g/dL   Globulin, Total 2.3 1.5 - 4.5 g/dL   Albumin/Globulin Ratio 2.0 1.2 - 2.2   Bilirubin Total 0.4 0.0 - 1.2 mg/dL   Alkaline Phosphatase 74 39 - 117 IU/L   AST 12 0 - 40 IU/L   ALT 13 0 - 44 IU/L  PTT  Result Value Ref Range   aPTT 26 24 - 33 sec  INR/PT  Result Value Ref Range   INR 0.9 0.8 - 1.2   Prothrombin Time 9.5 9.1 - 12.0 sec      Assessment & Plan:   Problem List Items Addressed This Visit      Endocrine   Type 2 diabetes mellitus with hyperglycemia, without long-term current use of insulin (HCC) - Primary    A1c 6.5.  Continue diet and exercise. Continue to monitor. Call with any concerns.       Relevant Orders   Bayer DCA Hb A1c Waived     Other   Insomnia    Refill of klonopin given last visit, to last 2 months, not due yet. Having issues with his CPAP. Strongly encouraged him to use it.       Chronic pain    Doing much better after surgery, but having pain at night. He is aware that he needs to see pain management, hasn't heard anything. Referral put back in again today. We will give him his medicine for this month, but this will be the last Rx for pain medication that he will get from [place]. He is aware.       Relevant Medications   HYDROcodone-acetaminophen (NORCO) 10-325 MG tablet   Other Relevant Orders   Ambulatory referral to Pain Clinic       Follow up plan: Return in about 3 months (around 10/02/2016) for DM visit.

## 2016-07-04 NOTE — Assessment & Plan Note (Signed)
A1c 6.5. Continue diet and exercise. Continue to monitor. Call with any concerns.

## 2016-07-04 NOTE — Assessment & Plan Note (Signed)
Doing much better after surgery, but having pain at night. He is aware that he needs to see pain management, hasn't heard anything. Referral put back in again today. We will give him his medicine for this month, but this will be the last Rx for pain medication that he will get from [place]. He is aware.

## 2016-07-05 ENCOUNTER — Ambulatory Visit: Payer: Commercial Managed Care - HMO | Admitting: Family Medicine

## 2016-07-07 ENCOUNTER — Telehealth: Payer: Self-pay

## 2016-07-07 ENCOUNTER — Encounter: Payer: Self-pay | Admitting: Surgery

## 2016-07-07 ENCOUNTER — Other Ambulatory Visit: Payer: Self-pay

## 2016-07-07 NOTE — Telephone Encounter (Signed)
Called [facility] to check status of specimen on this patient. I had to leave a message asking someone to call me back.

## 2016-07-10 ENCOUNTER — Telehealth: Payer: Self-pay

## 2016-07-10 NOTE — Telephone Encounter (Signed)
Left message at this time. Patient needs to schedule post op appointment to check wound and discuss pathology results.

## 2016-07-11 ENCOUNTER — Encounter: Payer: Self-pay | Admitting: Surgery

## 2016-07-11 ENCOUNTER — Telehealth: Payer: Self-pay | Admitting: Family Medicine

## 2016-07-11 NOTE — Telephone Encounter (Signed)
Needs appointment

## 2016-07-11 NOTE — Telephone Encounter (Signed)
Spoke with patient at this time. He is scheduled for 07/17/16 @ 3:30 with Dr.Pabon. Patient stated he just returned to work and could not come in this week for appointment. Reminder also sent to patients address.

## 2016-07-11 NOTE — Telephone Encounter (Signed)
Pt called and stated that he is interested in quitting smoking and would like to know if he could have something sent in to [facility]. He stated that he was interested in the patches but could not afford then at the moment.

## 2016-07-17 ENCOUNTER — Encounter: Payer: Self-pay | Admitting: Family Medicine

## 2016-07-17 ENCOUNTER — Ambulatory Visit (INDEPENDENT_AMBULATORY_CARE_PROVIDER_SITE_OTHER): Payer: Medicare HMO | Admitting: Family Medicine

## 2016-07-17 ENCOUNTER — Encounter: Payer: Self-pay | Admitting: Surgery

## 2016-07-17 VITALS — BP 116/72 | HR 77 | Temp 98.4°F | Wt 287.6 lb

## 2016-07-17 DIAGNOSIS — F5101 Primary insomnia: Secondary | ICD-10-CM

## 2016-07-17 DIAGNOSIS — Z720 Tobacco use: Secondary | ICD-10-CM | POA: Diagnosis not present

## 2016-07-17 MED ORDER — VARENICLINE TARTRATE 0.5 MG X 11 & 1 MG X 42 PO MISC: ORAL | 0 refills | Status: DC

## 2016-07-17 MED ORDER — NICOTINE 14 MG/24HR TD PT24: 14.0000 mg | MEDICATED_PATCH | Freq: Every day | TRANSDERMAL | 0 refills | Status: DC

## 2016-07-17 MED ORDER — NICOTINE 21 MG/24HR TD PT24: 21.0000 mg | MEDICATED_PATCH | Freq: Every day | TRANSDERMAL | 0 refills | Status: DC

## 2016-07-17 MED ORDER — CLONAZEPAM 0.5 MG PO TABS: ORAL_TABLET | ORAL | 0 refills | Status: DC

## 2016-07-17 MED ORDER — VARENICLINE TARTRATE 1 MG PO TABS: 1.0000 mg | ORAL_TABLET | Freq: Two times a day (BID) | ORAL | 1 refills | Status: DC

## 2016-07-17 NOTE — Patient Instructions (Addendum)
Steps to Quit Smoking Smoking tobacco can be bad for your health. It can also affect almost every organ in your body. Smoking puts you and people around you at risk for many serious long-lasting (chronic) diseases. Quitting smoking is hard, but it is one of the best things that you can do for your health. It is never too late to quit. What are the benefits of quitting smoking? When you quit smoking, you lower your risk for getting serious diseases and conditions. They can include:  Lung cancer or lung disease.  Heart disease.  Stroke.  Heart attack.  Not being able to have children (infertility).  Weak bones (osteoporosis) and broken bones (fractures). If you have coughing, wheezing, and shortness of breath, those symptoms may get better when you quit. You may also get sick less often. If you are pregnant, quitting smoking can help to lower your chances of having a baby of low birth weight. What can I do to help me quit smoking? Talk with your doctor about what can help you quit smoking. Some things you can do (strategies) include:  Quitting smoking totally, instead of slowly cutting back how much you smoke over a period of time.  Going to in-person counseling. You are more likely to quit if you go to many counseling sessions.  Using resources and support systems, such as:  Online chats with a counselor.  Phone quitlines.  Printed self-help materials.  Support groups or group counseling.  Text messaging programs.  Mobile phone apps or applications.  Taking medicines. Some of these medicines may have nicotine in them. If you are pregnant or breastfeeding, do not take any medicines to quit smoking unless your doctor says it is okay. Talk with your doctor about counseling or other things that can help you. Talk with your doctor about using more than one strategy at the same time, such as taking medicines while you are also going to in-person counseling. This can help make quitting  easier. What things can I do to make it easier to quit? Quitting smoking might feel very hard at first, but there is a lot that you can do to make it easier. Take these steps:  Talk to your family and friends. Ask them to support and encourage you.  Call phone quitlines, reach out to support groups, or work with a counselor.  Ask people who smoke to not smoke around you.  Avoid places that make you want (trigger) to smoke, such as:  Bars.  Parties.  Smoke-break areas at work.  Spend time with people who do not smoke.  Lower the stress in your life. Stress can make you want to smoke. Try these things to help your stress:  Getting regular exercise.  Deep-breathing exercises.  Yoga.  Meditating.  Doing a body scan. To do this, close your eyes, focus on one area of your body at a time from head to toe, and notice which parts of your body are tense. Try to relax the muscles in those areas.  Download or buy apps on your mobile phone or tablet that can help you stick to your quit plan. There are many free apps, such as QuitGuide from the CDC (Centers for Disease Control and Prevention). You can find more support from smokefree.gov and other websites. This information is not intended to replace advice given to you by your health care provider. Make sure you discuss any questions you have with your health care provider. Document Released: 04/01/2009 Document Revised: 02/01/2016 Document   Reviewed: 10/20/2014 Elsevier Interactive Patient Education  2017 Elsevier Inc.  

## 2016-07-17 NOTE — Assessment & Plan Note (Signed)
30 pills lasted him almost 2 months. Refills given today. Call with any concerns.  

## 2016-07-17 NOTE — Assessment & Plan Note (Signed)
Will start him on chantix and recheck in 1 month. Patches PRN. Call with any concerns.

## 2016-07-17 NOTE — Progress Notes (Signed)
   BP 116/72   Pulse 77   Temp 98.4 F (36.9 C)   Wt 287 lb 9.6 oz (130.5 kg)   SpO2 98%   BMI 37.94 kg/m    Subjective:    Patient ID: Rickey Gottron., male    DOB: June 15, 1961, 56 y.o.   MRN: [id number]  HPI: Rickey Glover. is a 56 y.o. male  Chief Complaint  Patient presents with  . Nicotine Dependence   SMOKING CESSATION Smoking Status: Current Every day Smoker Smoking Amount: 1ppd Smoking Onset: 32 years Smoking Quit Date: Not set Smoking triggers: Boredom, Driving, first thing in the morning.  Type of tobacco use: cigarettes Children in the house: yes Other household members who smoke: no Treatments attempted: cold [place], patches  Pneumovax: up to date  He notes that he just took a pain pill before he came in. Working on weaning off his medicine.   Relevant past medical, surgical, family and social history reviewed and updated as indicated. Interim medical history since our last visit reviewed. Allergies and medications reviewed and updated.  Review of Systems  Constitutional: Negative.   Respiratory: Negative.   Cardiovascular: Negative.   Neurological: Negative.   Psychiatric/Behavioral: Negative.     Per HPI unless specifically indicated above     Objective:    BP 116/72   Pulse 77   Temp 98.4 F (36.9 C)   Wt 287 lb 9.6 oz (130.5 kg)   SpO2 98%   BMI 37.94 kg/m   Wt Readings from Last 3 Encounters:  07/17/16 287 lb 9.6 oz (130.5 kg)  07/04/16 280 lb 11.2 oz (127.3 kg)  06/30/16 283 lb (128.4 kg)    Physical Exam  Constitutional: He is oriented to person, place, and time. He appears well-developed and well-nourished. No distress.  HENT:  Head: Normocephalic and atraumatic.  Right Ear: Hearing normal.  Left Ear: Hearing normal.  Nose: Nose normal.  Eyes: Conjunctivae and lids are normal. Right eye exhibits no discharge. Left eye exhibits no discharge. No scleral icterus.  Cardiovascular: Normal rate, regular rhythm, normal  heart sounds and intact distal pulses.  Exam reveals no gallop and no friction rub.   No murmur heard. Pulmonary/Chest: Effort normal and breath sounds normal. No respiratory distress. He has no wheezes. He has no rales. He exhibits no tenderness.  Musculoskeletal: Normal range of motion.  Neurological: He is alert and oriented to person, place, and time.  Skin: Skin is warm, dry and intact. No rash noted. No erythema. No pallor.  Psychiatric: He has a normal mood and affect. His speech is normal and behavior is normal. Judgment and thought content normal. Cognition and memory are normal.  Nursing note and vitals reviewed.   Results for orders placed or performed in visit on 07/04/16  Bayer DCA Hb A1c Waived  Result Value Ref Range   Bayer DCA Hb A1c Waived 6.5 <7.0 %      Assessment & Plan:   Problem List Items Addressed This Visit      Other   Insomnia    30 pills lasted him almost 2 months. Refills given today. Call with any concerns.       Tobacco abuse - Primary    Will start him on chantix and recheck in 1 month. Patches PRN. Call with any concerns.           Follow up plan: Return in about 4 weeks (around 08/14/2016) for Follow up smoking.

## 2016-07-18 ENCOUNTER — Telehealth: Payer: Self-pay

## 2016-07-18 NOTE — Telephone Encounter (Signed)
Patient is unable to come to follow up appointment at this time. He stated he started back to work from being out with Hip replacement surgery and cannot miss any work at this time. He stated he is doing well and does not have any problems from having the cyst removed.

## 2016-07-20 DIAGNOSIS — R110 Nausea: Secondary | ICD-10-CM | POA: Diagnosis not present

## 2016-07-20 DIAGNOSIS — R6883 Chills (without fever): Secondary | ICD-10-CM | POA: Diagnosis not present

## 2016-07-20 DIAGNOSIS — R6889 Other general symptoms and signs: Secondary | ICD-10-CM | POA: Diagnosis not present

## 2016-07-24 DIAGNOSIS — G4733 Obstructive sleep apnea (adult) (pediatric): Secondary | ICD-10-CM | POA: Diagnosis not present

## 2016-08-21 DIAGNOSIS — G4733 Obstructive sleep apnea (adult) (pediatric): Secondary | ICD-10-CM | POA: Diagnosis not present

## 2016-08-25 ENCOUNTER — Encounter: Payer: Self-pay | Admitting: Family Medicine

## 2016-08-25 ENCOUNTER — Ambulatory Visit (INDEPENDENT_AMBULATORY_CARE_PROVIDER_SITE_OTHER): Payer: 59 | Admitting: Family Medicine

## 2016-08-25 VITALS — BP 115/78 | HR 98 | Temp 98.1°F | Resp 17 | Ht 73.0 in | Wt 285.0 lb

## 2016-08-25 DIAGNOSIS — R200 Anesthesia of skin: Secondary | ICD-10-CM

## 2016-08-25 DIAGNOSIS — R202 Paresthesia of skin: Secondary | ICD-10-CM | POA: Diagnosis not present

## 2016-08-25 DIAGNOSIS — M5432 Sciatica, left side: Secondary | ICD-10-CM | POA: Diagnosis not present

## 2016-08-25 DIAGNOSIS — M5431 Sciatica, right side: Secondary | ICD-10-CM

## 2016-08-25 MED ORDER — CYCLOBENZAPRINE HCL 10 MG PO TABS: 10.0000 mg | ORAL_TABLET | Freq: Every day | ORAL | 0 refills | Status: DC

## 2016-08-25 NOTE — Progress Notes (Signed)
BP 115/78 (BP Location: Left Arm, Patient Position: Sitting, Cuff Size: Large)   Pulse 98   Temp 98.1 F (36.7 C) (Oral)   Resp 17   Ht 6\' 1"  (1.854 m)   Wt 285 lb (129.3 kg)   SpO2 99%   BMI 37.60 kg/m    Subjective:    Patient ID: [name]., male    DOB: 21-Nov-1960, 56 y.o.   MRN: [id number]  HPI: [place] Talcott. is a 55 y.o. male  Chief Complaint  Patient presents with  . Nicotine Dependence  . Numbness    Left hand and both legs   Did not pick up patches or the chantix. He is not at the point to quit smoking right now.  NUMBNESS Duration: about a month on the hand, about a week ago with the legs Onset: sudden Location: L hand and bilateral legs Bilateral: yes Symmetric: yes Decreased sensation: unknown  Weakness: no Pain: no Quality:  Tingling  Severity: moderate  Frequency: intermittent Trauma: no Recent illness: no Diabetes: yes Thyroid disease: no  HIV: no  Alcoholism: no  Spinal cord injury: no Alleviating factors: sitting, shaking the hand out Aggravating factors: standing Status: worse Treatments attempted: nothing  Relevant past medical, surgical, family and social history reviewed and updated as indicated. Interim medical history since our last visit reviewed. Allergies and medications reviewed and updated.  Review of Systems  Constitutional: Negative.   Respiratory: Negative.   Cardiovascular: Negative.   Musculoskeletal: Negative.   Neurological: Positive for numbness. Negative for dizziness, tremors, seizures, syncope, facial asymmetry, speech difficulty, weakness, light-headedness and headaches.  Psychiatric/Behavioral: Negative.     Per HPI unless specifically indicated above     Objective:    BP 115/78 (BP Location: Left Arm, Patient Position: Sitting, Cuff Size: Large)   Pulse 98   Temp 98.1 F (36.7 C) (Oral)   Resp 17   Ht 6\' 1"  (1.854 m)   Wt 285 lb (129.3 kg)   SpO2 99%   BMI 37.60 kg/m   Wt  Readings from Last 3 Encounters:  08/25/16 285 lb (129.3 kg)  07/17/16 287 lb 9.6 oz (130.5 kg)  07/04/16 280 lb 11.2 oz (127.3 kg)    Physical Exam  Constitutional: He is oriented to person, place, and time. He appears well-developed and well-nourished. No distress.  HENT:  Head: Normocephalic and atraumatic.  Right Ear: Hearing normal.  Left Ear: Hearing normal.  Nose: Nose normal.  Eyes: Conjunctivae and lids are normal. Right eye exhibits no discharge. Left eye exhibits no discharge. No scleral icterus.  Pulmonary/Chest: Effort normal. No respiratory distress.  Musculoskeletal:  Negative tinel's, negative phalen's  Neurological: He is alert and oriented to person, place, and time.  Skin: Skin is warm, dry and intact. No rash noted. No erythema. No pallor.  Psychiatric: He has a normal mood and affect. His speech is normal and behavior is normal. Judgment and thought content normal. Cognition and memory are normal.  Back Exam:    Inspection:  Normal spinal curvature.  No deformity, ecchymosis, erythema, or lesions     Palpation:     Midline spinal tenderness: no      Paralumbar tenderness: no      Parathoracic tenderness: no      Buttocks tenderness: no     Range of Motion:      Flexion: Fingers to Mid-[place]     Extension:Decreased     Lateral bending:Decreased    Rotation:Decreased  Neuro Exam:Lower extremity DTRs normal & symmetric.  Strength and sensation intact.    Special Tests:      Straight leg raise:negative   Results for orders placed or performed in visit on 07/04/16  Bayer DCA Hb A1c Waived  Result Value Ref Range   Bayer DCA Hb A1c Waived 6.5 <7.0 %      Assessment & Plan:   Problem List Items Addressed This Visit    None    Visit Diagnoses    Bilateral sciatica    -  Primary   Will start him on flexeril. Start stretches. Recheck in 1 month, if not better, consider blood work vs repeat x-ray.   Relevant Medications   cyclobenzaprine (FLEXERIL) 10 MG  tablet   Numbness and tingling in left hand       Likely due to shaking steering wheel. Work on stretches. Call with any concerns.        Follow up plan: Return in about 4 weeks (around 09/22/2016) for DM.

## 2016-08-25 NOTE — Patient Instructions (Addendum)

## 2016-09-15 ENCOUNTER — Encounter: Payer: Self-pay | Admitting: Family Medicine

## 2016-09-15 ENCOUNTER — Other Ambulatory Visit: Payer: Self-pay | Admitting: Family Medicine

## 2016-09-15 ENCOUNTER — Ambulatory Visit (INDEPENDENT_AMBULATORY_CARE_PROVIDER_SITE_OTHER): Payer: 59 | Admitting: Family Medicine

## 2016-09-15 VITALS — BP 115/74 | HR 82 | Temp 98.2°F | Resp 17 | Ht 73.0 in | Wt 289.0 lb

## 2016-09-15 DIAGNOSIS — R569 Unspecified convulsions: Secondary | ICD-10-CM

## 2016-09-15 DIAGNOSIS — M545 Low back pain, unspecified: Secondary | ICD-10-CM

## 2016-09-15 MED ORDER — TIZANIDINE HCL 4 MG PO TABS
4.0000 mg | ORAL_TABLET | Freq: Three times a day (TID) | ORAL | 1 refills | Status: DC
Start: 2016-09-15 — End: 2016-11-02

## 2016-09-15 MED ORDER — DICLOFENAC SODIUM 75 MG PO TBEC: 75.0000 mg | DELAYED_RELEASE_TABLET | Freq: Two times a day (BID) | ORAL | 3 refills | Status: DC

## 2016-09-15 NOTE — Progress Notes (Signed)
BP 115/74 (BP Location: Left Arm, Patient Position: Sitting, Cuff Size: Large)   Pulse 82   Temp 98.2 F (36.8 C) (Oral)   Resp 17   Ht 6\' 1"  (1.854 m)   Wt 289 lb (131.1 kg)   SpO2 96%   BMI 38.13 kg/m    Subjective:    Patient ID: Rickey Gottron., male    DOB: 01/11/1961, 56 y.o.   MRN: [id number]  HPI: Rickey Eltringham. is a 56 y.o. male  Chief Complaint  Patient presents with  . Back Pain   BACK PAIN Duration: about a month Mechanism of injury: no trauma Location: bilateral and low back Onset: gradual Severity: 8/10 Quality: sharp and aching Frequency: constant Radiation: L leg below the knee Aggravating factors: in the AM and riding in his truck Alleviating factors: nothing Status: worse Treatments attempted: muscle relaxer, rest and rest, 400-600mg  ibuprofen every 4-6 hours  Relief with NSAIDs?: mild Nighttime pain:  no Paresthesias / decreased sensation:  no Bowel / bladder incontinence:  no Fevers:  no Dysuria / urinary frequency:  yes  Was sitting watching TV and he couldn't move. He felt like his heart was racing. He was crying. He notes that he was shaking a bit. He remembered everything. He was confused afterwards. He says his girlfriend says his face contorted.  Relevant past medical, surgical, family and social history reviewed and updated as indicated. Interim medical history since our last visit reviewed. Allergies and medications reviewed and updated.  Review of Systems  Respiratory: Negative.   Cardiovascular: Negative.   Neurological: Positive for dizziness, seizures, facial asymmetry and weakness. Negative for tremors, syncope, speech difficulty, light-headedness, numbness and headaches.  Psychiatric/Behavioral: Negative.     Per HPI unless specifically indicated above     Objective:    BP 115/74 (BP Location: Left Arm, Patient Position: Sitting, Cuff Size: Large)   Pulse 82   Temp 98.2 F (36.8 C) (Oral)   Resp 17   Ht  6\' 1"  (1.854 m)   Wt 289 lb (131.1 kg)   SpO2 96%   BMI 38.13 kg/m   Wt Readings from Last 3 Encounters:  09/15/16 289 lb (131.1 kg)  08/25/16 285 lb (129.3 kg)  07/17/16 287 lb 9.6 oz (130.5 kg)    Physical Exam  Constitutional: He is oriented to person, place, and time. He appears well-developed and well-nourished. No distress.  HENT:  Head: Normocephalic and atraumatic.  Right Ear: Hearing normal.  Left Ear: Hearing normal.  Nose: Nose normal.  Eyes: Conjunctivae and lids are normal. Right eye exhibits no discharge. Left eye exhibits no discharge. No scleral icterus.  Cardiovascular: Normal rate, regular rhythm, normal heart sounds and intact distal pulses.  Exam reveals no gallop and no friction rub.   No murmur heard. Pulmonary/Chest: Effort normal and breath sounds normal. No respiratory distress. He has no wheezes. He has no rales. He exhibits no tenderness.  Musculoskeletal: Normal range of motion.  Neurological: He is alert and oriented to person, place, and time. He has normal reflexes. He displays normal reflexes. No cranial nerve deficit. He exhibits normal muscle tone. Coordination normal.  Skin: Skin is intact. No rash noted. He is not diaphoretic.  Psychiatric: He has a normal mood and affect. His speech is normal and behavior is normal. Judgment and thought content normal. Cognition and memory are normal.  Nursing note and vitals reviewed. Back Exam:    Inspection:  Normal spinal curvature.  No deformity, ecchymosis, erythema,  or lesions     Palpation:     Midline spinal tenderness: no      Paralumbar tenderness: yes L>R     Parathoracic tenderness: yes L>R     Buttocks tenderness: yesRight     Range of Motion:      Flexion: Fingers to Knees     Extension:Decreased     Lateral bending:Decreased    Rotation:Decreased    Neuro Exam:Lower extremity DTRs normal & symmetric.  Strength and sensation intact.    Special Tests:      Straight leg  raise:negative   Results for orders placed or performed in visit on 07/04/16  Bayer DCA Hb A1c Waived  Result Value Ref Range   Bayer DCA Hb A1c Waived 6.5 <7.0 %      Assessment & Plan:   Problem List Items Addressed This Visit    None    Visit Diagnoses    Acute bilateral low back pain without sciatica    -  Primary   Seems to be due to muscle spasm. Voltaren and tizanidine. Call if not getting better or getting worse. If not better in 1-2 weeks will obtain x-ray.   Relevant Medications   tiZANidine (ZANAFLEX) 4 MG tablet   diclofenac (VOLTAREN) 75 MG EC tablet   Other Relevant Orders   UA/M w/rflx Culture, Routine   Observed seizure-like activity ([place])       Will check labs and get him back into his neurologist for evaluation. Their office is closed- so will call them on Monday. Not to drive until he sees them.    Relevant Orders   VITAMIN D 25 Hydroxy (Vit-D Deficiency, Fractures)   CBC with Differential/Platelet   Comprehensive metabolic panel   TSH   K34 and Folate Panel       Follow up plan: Return in about 4 weeks (around 10/13/2016) for DM follow up.

## 2016-09-16 LAB — MICROSCOPIC EXAMINATION
BACTERIA UA: NONE SEEN
RBC MICROSCOPIC, UA: NONE SEEN /HPF (ref 0–?)
WBC, UA: NONE SEEN /hpf (ref 0–?)

## 2016-09-16 LAB — COMPREHENSIVE METABOLIC PANEL
ALBUMIN: 4.4 g/dL (ref 3.5–5.5)
ALK PHOS: 105 IU/L (ref 39–117)
ALT: 15 IU/L (ref 0–44)
AST: 14 IU/L (ref 0–40)
Albumin/Globulin Ratio: 1.9 (ref 1.2–2.2)
BILIRUBIN TOTAL: 0.3 mg/dL (ref 0.0–1.2)
BUN/Creatinine Ratio: 11 (ref 9–20)
BUN: 12 mg/dL (ref 6–24)
CALCIUM: 9.6 mg/dL (ref 8.7–10.2)
CHLORIDE: 101 mmol/L (ref 96–106)
CO2: 24 mmol/L (ref 18–29)
CREATININE: 1.13 mg/dL (ref 0.76–1.27)
GFR calc non Af Amer: 74 mL/min/{1.73_m2} (ref >59)
GFR, EST AFRICAN AMERICAN: 85 mL/min/{1.73_m2} (ref >59)
GLOBULIN, TOTAL: 2.3 g/dL (ref 1.5–4.5)
GLUCOSE: 164 mg/dL — AB (ref 65–99)
Potassium: 4.9 mmol/L (ref 3.5–5.2)
SODIUM: 142 mmol/L (ref 134–144)
Total Protein: 6.7 g/dL (ref 6.0–8.5)

## 2016-09-16 LAB — CBC WITH DIFFERENTIAL/PLATELET
BASOS ABS: 0 10*3/uL (ref 0.0–0.2)
Basos: 0 %
EOS (ABSOLUTE): 0.2 10*3/uL (ref 0.0–0.4)
Eos: 2 %
Hematocrit: 42.5 % (ref 37.5–51.0)
Hemoglobin: 13.8 g/dL (ref 13.0–17.7)
IMMATURE GRANS (ABS): 0 10*3/uL (ref 0.0–0.1)
IMMATURE GRANULOCYTES: 0 %
LYMPHS: 29 %
Lymphocytes Absolute: 3.5 10*3/uL — ABNORMAL HIGH (ref 0.7–3.1)
MCH: 26.1 pg — ABNORMAL LOW (ref 26.6–33.0)
MCHC: 32.5 g/dL (ref 31.5–35.7)
MCV: 81 fL (ref 79–97)
MONOS ABS: 0.6 10*3/uL (ref 0.1–0.9)
Monocytes: 5 %
NEUTROS PCT: 64 %
Neutrophils Absolute: 8 10*3/uL — ABNORMAL HIGH (ref 1.4–7.0)
PLATELETS: 330 10*3/uL (ref 150–379)
RBC: 5.28 x10E6/uL (ref 4.14–5.80)
RDW: 14.9 % (ref 12.3–15.4)
WBC: 12.3 10*3/uL — AB (ref 3.4–10.8)

## 2016-09-16 LAB — UA/M W/RFLX CULTURE, ROUTINE
BILIRUBIN UA: NEGATIVE
Glucose, UA: NEGATIVE
Leukocytes, UA: NEGATIVE
NITRITE UA: NEGATIVE
PH UA: 5.5 (ref 5.0–7.5)
RBC UA: NEGATIVE
Urobilinogen, Ur: 1 mg/dL (ref 0.2–1.0)

## 2016-09-16 LAB — B12 AND FOLATE PANEL
Folate: 9.9 ng/mL (ref >3.0)
Vitamin B-12: 549 pg/mL (ref 232–1245)

## 2016-09-16 LAB — TSH: TSH: 1.31 u[IU]/mL (ref 0.450–4.500)

## 2016-09-16 LAB — VITAMIN D 25 HYDROXY (VIT D DEFICIENCY, FRACTURES): Vit D, 25-Hydroxy: 13.4 ng/mL — ABNORMAL LOW (ref 30.0–100.0)

## 2016-09-18 ENCOUNTER — Other Ambulatory Visit: Payer: Self-pay | Admitting: Family Medicine

## 2016-09-18 ENCOUNTER — Telehealth: Payer: Self-pay | Admitting: Family Medicine

## 2016-09-18 DIAGNOSIS — R569 Unspecified convulsions: Secondary | ICD-10-CM

## 2016-09-18 MED ORDER — VITAMIN D (ERGOCALCIFEROL) 1.25 MG (50000 UNIT) PO CAPS: 50000.0000 [IU] | ORAL_CAPSULE | ORAL | 0 refills | Status: DC

## 2016-09-18 NOTE — Telephone Encounter (Signed)
Spoke with Rickey Glover after leaving message on patients voice mail about appointment with Dr. Burr Medico PA, [facility] on Thursday, April 5th at 1:15 at the [facility] location [facility]) [phone]. Also Dr. Wynetta Emery put in a referral to the neurologist and they will be calling him to make that appointment, be on look out for that phone call.

## 2016-09-18 NOTE — Telephone Encounter (Signed)
Please see if we can get him an appointment with his orthopedist and also with Dr. Leta Baptist for seizure like activity.

## 2016-09-18 NOTE — Telephone Encounter (Signed)
Rickey Glover, we got him an appointment with Orthopedics Thursday 4/5 at 1:15 at the [facility] location- can you let his GF know?  He needs a new referral to neurology, which I put in today and they are going to contact him. Thanks!

## 2016-09-19 NOTE — Telephone Encounter (Signed)
Directed referral to [place] where Dr. Leta Baptist is.

## 2016-09-21 DIAGNOSIS — G4733 Obstructive sleep apnea (adult) (pediatric): Secondary | ICD-10-CM | POA: Diagnosis not present

## 2016-09-22 DIAGNOSIS — Z96641 Presence of right artificial hip joint: Secondary | ICD-10-CM | POA: Diagnosis not present

## 2016-09-22 DIAGNOSIS — M7061 Trochanteric bursitis, right hip: Secondary | ICD-10-CM | POA: Diagnosis not present

## 2016-09-25 ENCOUNTER — Telehealth: Payer: Self-pay | Admitting: Family Medicine

## 2016-09-25 NOTE — Telephone Encounter (Signed)
Called pt-- no answer will call back

## 2016-09-25 NOTE — Telephone Encounter (Signed)
Pt called and stated he was given a cortisone injection in his hip and it is feeling better. He would like to know if he should continue to take the medication given at his last appt.

## 2016-09-25 NOTE — Telephone Encounter (Signed)
Pt.notified

## 2016-09-25 NOTE — Telephone Encounter (Signed)
He does not need to take it if he's feeling better.

## 2016-10-02 ENCOUNTER — Ambulatory Visit: Payer: Commercial Managed Care - HMO | Admitting: Podiatry

## 2016-10-02 ENCOUNTER — Ambulatory Visit: Payer: Medicare HMO | Admitting: Family Medicine

## 2016-10-04 ENCOUNTER — Encounter: Payer: Self-pay | Admitting: Family Medicine

## 2016-10-04 DIAGNOSIS — M1712 Unilateral primary osteoarthritis, left knee: Secondary | ICD-10-CM | POA: Diagnosis not present

## 2016-10-11 ENCOUNTER — Telehealth: Payer: Self-pay | Admitting: Family Medicine

## 2016-10-11 NOTE — Telephone Encounter (Signed)
I just gave him stretches, but he may be thinking of the flexeril, which I gave him to help with muscle spasms.

## 2016-10-11 NOTE — Telephone Encounter (Signed)
Dr.Johnson Do you know what medication he is talking about? I looked back at his notes, the only thing that I saw was Flexeril but that was given to him for Sciatica on 08/25/16.  Looks like he was told to do stretches.

## 2016-10-11 NOTE — Telephone Encounter (Signed)
Patient needs to know what medication Dr Wynetta Emery had him on for numbness in his hand.  He cannot remember the name of it.  Please advise.    Thanks  [phone]

## 2016-10-18 ENCOUNTER — Ambulatory Visit: Payer: Medicare HMO | Admitting: Diagnostic Neuroimaging

## 2016-10-18 ENCOUNTER — Ambulatory Visit: Payer: Medicare HMO | Admitting: Family Medicine

## 2016-10-20 ENCOUNTER — Ambulatory Visit: Payer: 59 | Admitting: Family Medicine

## 2016-10-21 DIAGNOSIS — G4733 Obstructive sleep apnea (adult) (pediatric): Secondary | ICD-10-CM | POA: Diagnosis not present

## 2016-11-01 ENCOUNTER — Encounter: Payer: Self-pay | Admitting: Family Medicine

## 2016-11-02 ENCOUNTER — Encounter: Payer: Self-pay | Admitting: Family Medicine

## 2016-11-02 ENCOUNTER — Ambulatory Visit (INDEPENDENT_AMBULATORY_CARE_PROVIDER_SITE_OTHER): Payer: 59 | Admitting: Family Medicine

## 2016-11-02 VITALS — BP 119/75 | HR 69 | Ht 73.5 in | Wt 293.0 lb

## 2016-11-02 DIAGNOSIS — R569 Unspecified convulsions: Secondary | ICD-10-CM

## 2016-11-02 DIAGNOSIS — F5101 Primary insomnia: Secondary | ICD-10-CM

## 2016-11-02 DIAGNOSIS — E1165 Type 2 diabetes mellitus with hyperglycemia: Secondary | ICD-10-CM | POA: Diagnosis not present

## 2016-11-02 DIAGNOSIS — G44219 Episodic tension-type headache, not intractable: Secondary | ICD-10-CM

## 2016-11-02 DIAGNOSIS — G4733 Obstructive sleep apnea (adult) (pediatric): Secondary | ICD-10-CM | POA: Diagnosis not present

## 2016-11-02 MED ORDER — NAPROXEN 500 MG PO TABS: 500.0000 mg | ORAL_TABLET | Freq: Two times a day (BID) | ORAL | 1 refills | Status: DC

## 2016-11-02 MED ORDER — CLONAZEPAM 0.5 MG PO TABS: ORAL_TABLET | ORAL | 0 refills | Status: DC

## 2016-11-02 NOTE — Patient Instructions (Addendum)
Appointment with [facility] Neurology January 01, 2017 8:45AM [place], [place], [place] [address] [phone]

## 2016-11-02 NOTE — Assessment & Plan Note (Signed)
30 pills lasted him almost 2 months. Refills given today. Call with any concerns.

## 2016-11-02 NOTE — Assessment & Plan Note (Signed)
A1c came back at 7.3 today. Really work on diet and exercise. Recheck 3 months.

## 2016-11-02 NOTE — Progress Notes (Signed)
BP 119/75   Pulse 69   Ht 6' 1.5" (1.867 m)   Wt 293 lb (132.9 kg)   SpO2 96%   BMI 38.13 kg/m    Subjective:    Patient ID: Rickey Gottron., male    DOB: 1960-09-15, 56 y.o.   MRN: [id number]  HPI: Rickey Varkey. is a 56 y.o. male  Chief Complaint  Patient presents with  . Follow-up  . Diabetes  . Headache    Left temperal headache daily.    DIABETES Hypoglycemic episodes:no Polydipsia/polyuria: no Visual disturbance: yes Chest pain: no Paresthesias: yes Glucose Monitoring: no Taking Insulin?: no Blood Pressure Monitoring: not checking Retinal Examination: Not up to Date Foot Exam: Up to Date Diabetic Education: Completed Pneumovax: Up to Date Influenza: Up to Date Aspirin: yes  HEADACHE Duration: About a week, has headaches 2-3x a day Onset: sudden Severity: severe Quality: sharp Frequency: coming and going Location:  L temple Headache duration: hours if he doesn't take his medicine Radiation: no Time of day headache occurs: comes and goes over the course of the day Alleviating factors: naproxen and tylenol Aggravating factors: nothing Headache status at time of visit: current headache Treatments attempted: Treatments attempted: rest, ice, heat, APAP, ibuprofen and aleve", excedrine   Aura: no Nausea:  no Vomiting: no Photophobia:  no Phonophobia:  no Effect on social functioning:  no Confusion:  no Gait disturbance/ataxia:  no Behavioral changes:  no Fevers:  no  INSOMNIA- doesn't like his CPAP because he's got some claustrophobia with the mask Duration:chronic Satisfied with sleep quality: no Difficulty falling asleep: no Difficulty staying asleep: yes Waking a few hours after sleep onset: yes Early morning awakenings: no Daytime hypersomnolence: yes Wakes feeling refreshed: no Good sleep hygiene: no Apnea: yes- hasn't gotten his CPAP yet Snoring: yes Depressed/anxious mood: no Recent stress: no Restless legs/nocturnal  leg cramps: no Chronic pain/arthritis:yes History of sleep study: yes Treatments attempted: melatonin, uinsom, benadryl and ambien  Relevant past medical, surgical, family and social history reviewed and updated as indicated. Interim medical history since our last visit reviewed. Allergies and medications reviewed and updated.  Review of Systems  Constitutional: Negative.   Respiratory: Negative.   Cardiovascular: Negative.   Musculoskeletal: Positive for arthralgias, back pain, gait problem and myalgias. Negative for joint swelling, neck pain and neck stiffness.  Neurological: Positive for numbness. Negative for dizziness, tremors, seizures, syncope, facial asymmetry, speech difficulty, weakness, light-headedness and headaches.  Psychiatric/Behavioral: Negative.     Per HPI unless specifically indicated above     Objective:    BP 119/75   Pulse 69   Ht 6' 1.5" (1.867 m)   Wt 293 lb (132.9 kg)   SpO2 96%   BMI 38.13 kg/m   Wt Readings from Last 3 Encounters:  11/02/16 293 lb (132.9 kg)  09/15/16 289 lb (131.1 kg)  08/25/16 285 lb (129.3 kg)    Physical Exam  Constitutional: He is oriented to person, place, and time. He appears well-developed and well-nourished. No distress.  HENT:  Head: Normocephalic and atraumatic.  Right Ear: Hearing normal.  Left Ear: Hearing normal.  Nose: Nose normal.  Eyes: Conjunctivae and lids are normal. Right eye exhibits no discharge. Left eye exhibits no discharge. No scleral icterus.  Cardiovascular: Normal rate, regular rhythm, normal heart sounds and intact distal pulses.  Exam reveals no gallop and no friction rub.   No murmur heard. Pulmonary/Chest: Effort normal and breath sounds normal. No respiratory distress. He has no wheezes.  He has no rales. He exhibits no tenderness.  Musculoskeletal: Normal range of motion.  Neurological: He is alert and oriented to person, place, and time.  Skin: Skin is warm, dry and intact. No rash noted.  He is not diaphoretic. No erythema. No pallor.  Psychiatric: He has a normal mood and affect. His speech is normal and behavior is normal. Judgment and thought content normal. Cognition and memory are normal.  Nursing note and vitals reviewed.   Results for orders placed or performed in visit on 09/15/16  Microscopic Examination  Result Value Ref Range   WBC, UA None seen 0 - 5 /hpf   RBC, UA None seen 0 - 2 /hpf   Epithelial Cells (non renal) CANCELED    Renal Epithel, UA 0-10 (A) None seen /hpf   Bacteria, UA None seen None seen/Few  UA/M w/rflx Culture, Routine  Result Value Ref Range   Specific Gravity, UA >1.030 (H) 1.005 - 1.030   pH, UA 5.5 5.0 - 7.5   Color, UA Yellow Yellow   Appearance Ur Clear Clear   Leukocytes, UA Negative Negative   Protein, UA 1+ (A) Negative/Trace   Glucose, UA Negative Negative   Ketones, UA 1+ (A) Negative   RBC, UA Negative Negative   Bilirubin, UA Negative Negative   Urobilinogen, Ur 1.0 0.2 - 1.0 mg/dL   Nitrite, UA Negative Negative   Microscopic Examination See below:   VITAMIN D 25 Hydroxy (Vit-D Deficiency, Fractures)  Result Value Ref Range   Vit D, 25-Hydroxy 13.4 (L) 30.0 - 100.0 ng/mL  CBC with Differential/Platelet  Result Value Ref Range   WBC 12.3 (H) 3.4 - 10.8 x10E3/uL   RBC 5.28 4.14 - 5.80 x10E6/uL   Hemoglobin 13.8 13.0 - 17.7 g/dL   Hematocrit 42.5 37.5 - 51.0 %   MCV 81 79 - 97 fL   MCH 26.1 (L) 26.6 - 33.0 pg   MCHC 32.5 31.5 - 35.7 g/dL   RDW 14.9 12.3 - 15.4 %   Platelets 330 150 - 379 x10E3/uL   Neutrophils 64 Not Estab. %   Lymphs 29 Not Estab. %   Monocytes 5 Not Estab. %   Eos 2 Not Estab. %   Basos 0 Not Estab. %   Neutrophils Absolute 8.0 (H) 1.4 - 7.0 x10E3/uL   Lymphocytes Absolute 3.5 (H) 0.7 - 3.1 x10E3/uL   Monocytes Absolute 0.6 0.1 - 0.9 x10E3/uL   EOS (ABSOLUTE) 0.2 0.0 - 0.4 x10E3/uL   Basophils Absolute 0.0 0.0 - 0.2 x10E3/uL   Immature Granulocytes 0 Not Estab. %   Immature Grans (Abs) 0.0  0.0 - 0.1 x10E3/uL  Comprehensive metabolic panel  Result Value Ref Range   Glucose 164 (H) 65 - 99 mg/dL   BUN 12 6 - 24 mg/dL   Creatinine, Ser 1.13 0.76 - 1.27 mg/dL   GFR calc non Af Amer 74 >59 mL/min/1.73   GFR calc Af Amer 85 >59 mL/min/1.73   BUN/Creatinine Ratio 11 9 - 20   Sodium 142 134 - 144 mmol/L   Potassium 4.9 3.5 - 5.2 mmol/L   Chloride 101 96 - 106 mmol/L   CO2 24 18 - 29 mmol/L   Calcium 9.6 8.7 - 10.2 mg/dL   Total Protein 6.7 6.0 - 8.5 g/dL   Albumin 4.4 3.5 - 5.5 g/dL   Globulin, Total 2.3 1.5 - 4.5 g/dL   Albumin/Globulin Ratio 1.9 1.2 - 2.2   Bilirubin Total 0.3 0.0 - 1.2 mg/dL  Alkaline Phosphatase 105 39 - 117 IU/L   AST 14 0 - 40 IU/L   ALT 15 0 - 44 IU/L  TSH  Result Value Ref Range   TSH 1.310 0.450 - 4.500 uIU/mL  B12 and Folate Panel  Result Value Ref Range   Vitamin B-12 549 232 - 1,245 pg/mL   Folate 9.9 >3.0 ng/mL      Assessment & Plan:   Problem List Items Addressed This Visit      Respiratory   OSA (obstructive sleep apnea)    Does not think that his pressure is high enough. Will get him back into sleep medicine. Referral generated today.      Relevant Orders   Ambulatory referral to Sleep Studies     Endocrine   Type 2 diabetes mellitus with hyperglycemia, without long-term current use of insulin (HCC) - Primary    A1c came back at 7.3 today. Really work on diet and exercise. Recheck 3 months.       Relevant Orders   Bayer DCA Hb A1c Waived     Other   Insomnia    30 pills lasted him almost 2 months. Refills given today. Call with any concerns.       Relevant Orders   Ambulatory referral to Sleep Studies    Other Visit Diagnoses    Episodic tension-type headache, not intractable       Potentially multifactorial. Will get him into neurology. USE YOUR CPAP. Referral to sleep medicine made. Naproxen PRN. Call if not getting better.   Relevant Medications   naproxen (NAPROSYN) 500 MG tablet   clonazePAM (KLONOPIN) 0.5  MG tablet   Seizure-like activity (HCC)       Again re-iterated that he should not be driving until he sees neurology. Appointment set up again with neurology. Await appointment.    Relevant Orders   Ambulatory referral to Neurology       Follow up plan: Return in about 3 months (around 02/02/2017) for DM/Cholesterol/BP follow up.

## 2016-11-02 NOTE — Assessment & Plan Note (Signed)
Does not think that his pressure is high enough. Will get him back into sleep medicine. Referral generated today.

## 2016-11-03 LAB — BAYER DCA HB A1C WAIVED: HB A1C (BAYER DCA - WAIVED): 7.3 % — ABNORMAL HIGH (ref <7.0)

## 2016-11-13 ENCOUNTER — Ambulatory Visit
Admission: EM | Admit: 2016-11-13 | Discharge: 2016-11-13 | Disposition: A | Discharge status: Home / Self Care | Payer: Medicare HMO | Attending: Family Medicine | Admitting: Family Medicine

## 2016-11-13 ENCOUNTER — Ambulatory Visit (INDEPENDENT_AMBULATORY_CARE_PROVIDER_SITE_OTHER): Payer: Medicare HMO

## 2016-11-13 ENCOUNTER — Encounter: Payer: Self-pay | Admitting: *Deleted

## 2016-11-13 DIAGNOSIS — M25562 Pain in left knee: Secondary | ICD-10-CM

## 2016-11-13 DIAGNOSIS — M1712 Unilateral primary osteoarthritis, left knee: Secondary | ICD-10-CM | POA: Diagnosis not present

## 2016-11-13 MED ORDER — KETOROLAC TROMETHAMINE 60 MG/2ML IM SOLN
30.0000 mg | Freq: Once | INTRAMUSCULAR | Status: AC
  Administered 2016-11-13: 30 mg via INTRAMUSCULAR

## 2016-11-13 MED ORDER — MELOXICAM 15 MG PO TABS: 15.0000 mg | ORAL_TABLET | Freq: Every day | ORAL | 0 refills | Status: DC | PRN

## 2016-11-13 NOTE — Discharge Instructions (Signed)
Take medication as prescribed. Rest. Elevate. Use ace bandage for support.   Follow up with your orthopedic this week for continued pain.   Follow up with your primary care physician this week as needed. Return to Urgent care for new or worsening concerns.

## 2016-11-13 NOTE — ED Triage Notes (Signed)
Yesterday, while standing from a seated position, pt felt and heard a "pop" in his left knee with immediate and later edema. Denies injury, and has been told his left knee should be replaced.

## 2016-11-13 NOTE — ED Provider Notes (Signed)
[facility] ____________________________________________  Time seen: Approximately 8:21 PM  I have reviewed the triage vital signs and the nursing notes.   HISTORY  Chief Complaint Knee Pain  HPI Rickey Glover. is a 56 y.o. male presents for evaluation of left knee pain. Patient reports that this is a recurrent issue for him. Patient states that yesterday he had been sitting, and describes sitting in a chair for a while, and when he went to stand up, he heard a pop and had onset of pain to his left knee. Patient reports he has had similar in the past. Reports he then had onset of swelling that has persisted. Patient denies any fall to the ground, direct injury or trauma. Patient reports he has issues with both his left and his right knee, and has followed with [facility] out of [place]. Patient reports he has had cortisone injections to both knees with was recently being to his right knee approximately 6 months ago. Patient states pain is currently mild, worse with direct palpation and ambulation. Denies pain radiation, paresthesias or decreased range of motion. Patient reports he does occasionally have some swelling to bilateral lower extremities, denies any atypical swelling. Reports has continued to remain active and ambulatory. Reports does have a Ace bandage at home that he uses in the situations that helps well. As well as a cane. Patient has not tried any alleviating factors at home. Reports otherwise feels well and denies other complaints.  Denies chest pain, shortness of breath, abdominal pain, or rash. Denies recent sickness. Denies recent antibiotic use.  Denies insect bite, tick bite or break in skin.  Valerie Roys, DO: PCP   Past Medical History:  Diagnosis Date  . Chronic back pain   . DDD (degenerative disc disease), lumbar   . Diabetes mellitus without complication ([place])   . Hip pain   . Hyperlipidemia   . Hypertension   . Insomnia   .  Knee pain, left   . LAP-BAND surgery status   . Sciatica of right side   . Sleep apnea    new DX - has not been for CPAP titration yet  . Vitamin D deficiency     Patient Active Problem List   Diagnosis Date Noted  . Sebaceous cyst 06/30/2016  . Multiple pulmonary nodules 01/13/2016  . Adrenal adenoma 01/13/2016  . CAD (coronary artery disease) 01/13/2016  . Controlled substance agreement signed 10/27/2015  . Hypertrophic toenail 10/08/2015  . Right hip pain 10/08/2015  . Special screening for malignant neoplasms, colon   . Chronic pain 06/30/2015  . Tobacco abuse 06/07/2015  . Amaurosis fugax of left eye 06/07/2015  . OSA (obstructive sleep apnea) 06/07/2015  . Insomnia 04/30/2015  . Leukocytosis 04/29/2015  . Type 2 diabetes mellitus with hyperglycemia, without long-term current use of insulin ([place]) 04/27/2015  . Right knee pain 04/27/2015  . DDD (degenerative disc disease), lumbar 04/27/2015  . Essential hypertension 04/27/2015  . HLD (hyperlipidemia) 04/27/2015  . Obesity 04/27/2015  . Bodies, loose, joint, knee 02/26/2015  . Degenerative arthritis of hip 02/01/2015  . Hallux abductovalgus with bunions 06/16/2014  . Hook nail 06/16/2014  . Fungal infection of nail 06/16/2014  . Foot pain 06/16/2014  . Lacunar infarction ([place]) 05/25/2014  . Lumbar radiculopathy 05/08/2014  . Morbid (severe) obesity due to excess calories ([place]) 02/05/2014  . ED (erectile dysfunction) of organic origin 12/16/2013  . History of surgical procedure 10/06/2013  . Arthritis of knee, degenerative 08/12/2012  .  Lumbar canal stenosis 06/04/2012  . Congestive heart failure ([place]) 02/12/2012  . Chronic pancreatitis ([place]) 02/05/2012    Past Surgical History:  Procedure Laterality Date  . COLONOSCOPY WITH PROPOFOL N/A 07/15/2015   Procedure: COLONOSCOPY WITH PROPOFOL;  Surgeon: Lucilla Lame, MD;  Location: [facility];  Service: Endoscopy;  Laterality: N/A;  Diabetic - oral meds Sleep  apnea  . JOINT REPLACEMENT Right 2016  . lap band surgery    . LUMBAR SPINE SURGERY  April 2017      Current Facility-Administered Medications:  .  ketorolac (TORADOL) injection 30 mg, 30 mg, Intramuscular, Once, Marylene Land, NP  Current Outpatient Prescriptions:  .  amLODipine (NORVASC) 10 MG tablet, Take 1 tablet (10 mg total) by mouth daily., Disp: 90 tablet, Rfl: 1 .  clonazePAM (KLONOPIN) 0.5 MG tablet, 1 tablet at bedtime as needed (try every other day at least), Disp: 30 tablet, Rfl: 0 .  furosemide (LASIX) 40 MG tablet, , Disp: , Rfl: 3 .  glimepiride (AMARYL) 4 MG tablet, Take 1 tablet (4 mg total) by mouth 2 (two) times daily., Disp: 180 tablet, Rfl: 1 .  metFORMIN (GLUCOPHAGE-XR) 500 MG 24 hr tablet, Take 1 tablet (500 mg total) by mouth 2 (two) times daily with a meal., Disp: 360 tablet, Rfl: 1 .  quinapril (ACCUPRIL) 40 MG tablet, Take 1 tablet (40 mg total) by mouth daily., Disp: 90 tablet, Rfl: 1 .  spironolactone (ALDACTONE) 25 MG tablet, Take 1 tablet (25 mg total) by mouth daily., Disp: 90 tablet, Rfl: 1 .  Vitamin D, Ergocalciferol, (DRISDOL) 50000 units CAPS capsule, Take 1 capsule (50,000 Units total) by mouth every 7 (seven) days., Disp: 12 capsule, Rfl: 0   Allergies Percocet [oxycodone-acetaminophen]  Family History  Problem Relation Age of Onset  . Aneurysm Mother        Brain  . Stroke Maternal Grandmother   . Stroke Paternal Grandmother     Social History Social History  Substance Use Topics  . Smoking status: Current Every Day Smoker    Packs/day: 0.50    Years: 20.00    Types: Cigarettes  . Smokeless tobacco: Never Used  . Alcohol use No    Review of Systems Constitutional: No fever/chills Cardiovascular: Denies chest pain. Respiratory: Denies shortness of breath. Gastrointestinal: No abdominal pain.  N Genitourinary: Negative for dysuria. Musculoskeletal: Negative for back pain. As above. Skin: Negative for  rash.  ____________________________________________   PHYSICAL EXAM:  VITAL SIGNS: ED Triage Vitals  Enc Vitals Group     BP 11/13/16 1949 136/86     Pulse Rate 11/13/16 1949 70     Resp 11/13/16 1949 16     Temp 11/13/16 1949 98.2 F (36.8 C)     Temp Source 11/13/16 1949 Oral     SpO2 11/13/16 1949 100 %     Weight 11/13/16 1950 286 lb (129.7 kg)     Height 11/13/16 1950 6\' 1"  (1.854 m)     Head Circumference --      Peak Flow --      Pain Score 11/13/16 1951 8     Pain Loc --      Pain Edu? --      Excl. in [place]? --     Constitutional: Alert and oriented. Well appearing and in no acute distress. Cardiovascular: Normal rate, regular rhythm. Grossly normal heart sounds.  Good peripheral circulation. Respiratory: Normal respiratory effort without tachypnea nor retractions. Breath sounds are clear and equal bilaterally. No  wheezes, rales, rhonchi. Gastrointestinal: Soft and nontender.  Musculoskeletal: No midline cervical, thoracic or lumbar tenderness to palpation. Bilateral pedal pulses equal and easily palpated. Except: Left knee mild left lateral anterior tenderness, medial upper mild to moderate tenderness to palpation with mild effusion noted, full range of motion present, no pain with anterior posterior drawer test, no pain with medial or lateral stress, no pain with resisted flexion or extension, full range of motion present. Left lower extremity otherwise nontender. No calf tenderness bilaterally. Ambulatory with mild antalgic gait. Neurologic:  Normal speech and language. Speech is normal. Skin:  Skin is warm, dry and intact. No rash noted.  Psychiatric: Mood and affect are normal. Speech and behavior are normal. Patient exhibits appropriate insight and judgment  ___________________________________________   LABS (all labs ordered are listed, but only abnormal results are displayed)  Labs Reviewed - No data to display  RADIOLOGY  Dg Knee Complete 4 Views  Left  Result Date: 11/13/2016 CLINICAL DATA:  Pain. EXAM: LEFT KNEE - COMPLETE 4+ VIEW COMPARISON:  None. FINDINGS: Calcifications projected in the soft tissues of the superior knee. The large is calcification may be in the suprapatellar space. Another is seen more laterally at the same level. Mild tricompartmental degenerative changes. No fracture or dislocation. No joint effusion. The oral no other acute abnormalities. IMPRESSION: 1. Suggested loose bodies in the suprapatellar space. 2. Tricompartmental degenerative changes, mild to moderate. Electronically Signed   By: Dorise Bullion III M.D   On: 11/13/2016 21:00   ____________________________________________   PROCEDURES Procedures    INITIAL IMPRESSION / ASSESSMENT AND PLAN / ED COURSE  Pertinent labs & imaging results that were available during my care of the patient were reviewed by me and considered in my medical decision making (see chart for details).  Very well-appearing patient in no acute distress. Presents for complaints of acute on chronic left knee pain. Suspect strain injury with continued osteoarthritis. 30 mg IM Toradol given once in urgent care with some improved pain afterwards. Will treat patient with oral daily Mobic, rest, ice and elevation. Patient states he has ace bandage at home that he will use as needed. Encourage follow-up with his orthopedics in [place]. Discussed follow-up and return parameters with patient.Discussed indication, risks and benefits of medications with patient.  Discussed follow up with Primary care physician this week. Discussed follow up and return parameters including no resolution or any worsening concerns. Patient verbalized understanding and agreed to plan.   ____________________________________________   FINAL CLINICAL IMPRESSION(S) / ED DIAGNOSES  Final diagnoses:  Acute pain of left knee     Discharge Medication List as of 11/13/2016  9:07 PM    START taking these  medications   Details  meloxicam (MOBIC) 15 MG tablet Take 1 tablet (15 mg total) by mouth daily as needed., Starting Mon 11/13/2016, Normal        Note: This dictation was prepared with Dragon dictation along with smaller phrase technology. Any transcriptional errors that result from this process are unintentional.         Marylene Land, NP 11/15/16 2141

## 2016-11-18 ENCOUNTER — Other Ambulatory Visit: Payer: Self-pay | Admitting: Family Medicine

## 2016-11-24 DIAGNOSIS — M545 Low back pain: Secondary | ICD-10-CM | POA: Diagnosis not present

## 2016-11-24 DIAGNOSIS — Z96641 Presence of right artificial hip joint: Secondary | ICD-10-CM | POA: Diagnosis not present

## 2016-11-24 DIAGNOSIS — M48061 Spinal stenosis, lumbar region without neurogenic claudication: Secondary | ICD-10-CM | POA: Diagnosis not present

## 2016-11-24 DIAGNOSIS — M7061 Trochanteric bursitis, right hip: Secondary | ICD-10-CM | POA: Diagnosis not present

## 2016-12-12 DIAGNOSIS — M48061 Spinal stenosis, lumbar region without neurogenic claudication: Secondary | ICD-10-CM | POA: Diagnosis not present

## 2016-12-19 ENCOUNTER — Other Ambulatory Visit: Payer: Self-pay | Admitting: Family Medicine

## 2017-01-01 ENCOUNTER — Ambulatory Visit: Payer: Medicare HMO | Admitting: Neurology

## 2017-01-19 ENCOUNTER — Telehealth: Payer: Self-pay | Admitting: Family Medicine

## 2017-01-19 MED ORDER — FUROSEMIDE 40 MG PO TABS: 40.0000 mg | ORAL_TABLET | Freq: Every day | ORAL | 3 refills | Status: DC

## 2017-01-19 NOTE — Telephone Encounter (Signed)
Patient requesting a refill on his Furosamide medication. Patient stated he called pharmacy today and pharmacy did not have medication , patient wanted to check on status of medication.  Please Advise.  Thank you

## 2017-01-19 NOTE — Telephone Encounter (Signed)
Routing to provider  

## 2017-01-19 NOTE — Telephone Encounter (Signed)
Patient notified

## 2017-01-19 NOTE — Telephone Encounter (Signed)
I have not gotten any requests on that, nor does it look like I've ever written it for him- so it was probably going to whoever's name is on his Rx bottle. I have now refilled it for him and sent it to his pharmacy.

## 2017-01-26 ENCOUNTER — Ambulatory Visit: Payer: 59

## 2017-01-26 DIAGNOSIS — E119 Type 2 diabetes mellitus without complications: Secondary | ICD-10-CM | POA: Diagnosis not present

## 2017-02-08 ENCOUNTER — Ambulatory Visit: Payer: 59

## 2017-02-09 ENCOUNTER — Ambulatory Visit: Payer: 59 | Admitting: Family Medicine

## 2017-02-15 ENCOUNTER — Telehealth: Payer: Self-pay

## 2017-02-15 MED ORDER — ATORVASTATIN CALCIUM 80 MG PO TABS: 80.0000 mg | ORAL_TABLET | Freq: Every day | ORAL | 1 refills | Status: DC

## 2017-02-15 NOTE — Telephone Encounter (Signed)
Received a fax recommending that patient be started on a statin since he is on Metformin.  Called and left a message to see if patient has been taking one.  Waiting for patient to return my call.

## 2017-02-15 NOTE — Telephone Encounter (Signed)
Patient is not on cholesterol medication, please send to [facility] in [place].

## 2017-02-23 ENCOUNTER — Ambulatory Visit (INDEPENDENT_AMBULATORY_CARE_PROVIDER_SITE_OTHER): Payer: 59 | Admitting: Family Medicine

## 2017-02-23 ENCOUNTER — Ambulatory Visit (INDEPENDENT_AMBULATORY_CARE_PROVIDER_SITE_OTHER): Payer: 59

## 2017-02-23 ENCOUNTER — Encounter: Payer: Self-pay | Admitting: Family Medicine

## 2017-02-23 VITALS — BP 134/79 | HR 88 | Temp 97.5°F | Resp 16 | Ht 73.5 in | Wt 306.0 lb

## 2017-02-23 DIAGNOSIS — M5136 Other intervertebral disc degeneration, lumbar region: Secondary | ICD-10-CM

## 2017-02-23 DIAGNOSIS — Z125 Encounter for screening for malignant neoplasm of prostate: Secondary | ICD-10-CM

## 2017-02-23 DIAGNOSIS — I2510 Atherosclerotic heart disease of native coronary artery without angina pectoris: Secondary | ICD-10-CM | POA: Diagnosis not present

## 2017-02-23 DIAGNOSIS — E785 Hyperlipidemia, unspecified: Secondary | ICD-10-CM

## 2017-02-23 DIAGNOSIS — M1712 Unilateral primary osteoarthritis, left knee: Secondary | ICD-10-CM | POA: Diagnosis not present

## 2017-02-23 DIAGNOSIS — Z0000 Encounter for general adult medical examination without abnormal findings: Secondary | ICD-10-CM

## 2017-02-23 DIAGNOSIS — D72829 Elevated white blood cell count, unspecified: Secondary | ICD-10-CM

## 2017-02-23 DIAGNOSIS — Z23 Encounter for immunization: Secondary | ICD-10-CM | POA: Diagnosis not present

## 2017-02-23 DIAGNOSIS — E1165 Type 2 diabetes mellitus with hyperglycemia: Secondary | ICD-10-CM | POA: Diagnosis not present

## 2017-02-23 DIAGNOSIS — I10 Essential (primary) hypertension: Secondary | ICD-10-CM | POA: Diagnosis not present

## 2017-02-23 MED ORDER — QUINAPRIL HCL 40 MG PO TABS: 40.0000 mg | ORAL_TABLET | Freq: Every day | ORAL | 1 refills | Status: DC

## 2017-02-23 MED ORDER — METFORMIN HCL ER 500 MG PO TB24: 500.0000 mg | ORAL_TABLET | Freq: Two times a day (BID) | ORAL | 1 refills | Status: DC

## 2017-02-23 MED ORDER — FUROSEMIDE 40 MG PO TABS: 40.0000 mg | ORAL_TABLET | Freq: Every day | ORAL | 3 refills | Status: DC

## 2017-02-23 MED ORDER — NAPROXEN 500 MG PO TABS: 500.0000 mg | ORAL_TABLET | Freq: Two times a day (BID) | ORAL | 1 refills | Status: DC

## 2017-02-23 MED ORDER — SPIRONOLACTONE 25 MG PO TABS: ORAL_TABLET | ORAL | 1 refills | Status: DC

## 2017-02-23 MED ORDER — AMLODIPINE BESYLATE 10 MG PO TABS: 10.0000 mg | ORAL_TABLET | Freq: Every day | ORAL | 1 refills | Status: DC

## 2017-02-23 MED ORDER — GLIMEPIRIDE 4 MG PO TABS: 4.0000 mg | ORAL_TABLET | Freq: Two times a day (BID) | ORAL | 1 refills | Status: DC

## 2017-02-23 NOTE — Assessment & Plan Note (Signed)
Continuing to act up. Referral to [facility] made today to try to get help with paying for MRI. Declines PT today. Appointment with orthopedics made today. Call with any concerns.

## 2017-02-23 NOTE — Progress Notes (Signed)
Subjective:   Rickey Glover. is a 56 y.o. male who presents for Medicare Annual/Subsequent preventive examination.  Review of Systems:  Cardiac Risk Factors include: male gender;obesity (BMI >30kg/m2);diabetes mellitus;dyslipidemia;hypertension     Objective:    Vitals: BP 134/79 (BP Location: Left Arm, Patient Position: Sitting)   Pulse 88   Temp (!) 97.5 F (36.4 C)   Resp 16   Ht 6' 1.5" (1.867 m)   Wt (!) 306 lb (138.8 kg)   SpO2 98%   BMI 39.82 kg/m   Body mass index is 39.82 kg/m.  Tobacco History  Smoking Status  . Current Every Day Smoker  . Packs/day: 0.50  . Years: 20.00  . Types: Cigarettes  Smokeless Tobacco  . Never Used     Ready to quit: Yes Counseling given: Yes   Past Medical History:  Diagnosis Date  . Chronic back pain   . DDD (degenerative disc disease), lumbar   . Diabetes mellitus without complication ([place])   . Hip pain   . Hyperlipidemia   . Hypertension   . Insomnia   . Knee pain, left   . LAP-BAND surgery status   . Sciatica of right side   . Sleep apnea    new DX - has not been for CPAP titration yet  . Vitamin D deficiency    Past Surgical History:  Procedure Laterality Date  . COLONOSCOPY WITH PROPOFOL N/A 07/15/2015   Procedure: COLONOSCOPY WITH PROPOFOL;  Surgeon: Lucilla Lame, MD;  Location: [facility];  Service: Endoscopy;  Laterality: N/A;  Diabetic - oral meds Sleep apnea  . JOINT REPLACEMENT Right 2016  . lap band surgery    . LUMBAR SPINE SURGERY  April 2017   Family History  Problem Relation Age of Onset  . Aneurysm Mother        Brain  . Stroke Maternal Grandmother   . Stroke Paternal Grandmother    History  Sexual Activity  . Sexual activity: Not Currently  . Birth control/ protection: None    Outpatient Encounter Prescriptions as of 02/23/2017  Medication Sig  . amLODipine (NORVASC) 10 MG tablet Take 1 tablet (10 mg total) by mouth daily.  Marland Kitchen atorvastatin (LIPITOR) 80 MG tablet Take 1  tablet (80 mg total) by mouth daily.  . clonazePAM (KLONOPIN) 0.5 MG tablet 1 tablet at bedtime as needed (try every other day at least)  . clopidogrel (PLAVIX) 75 MG tablet TK 1 T PO D  . furosemide (LASIX) 40 MG tablet Take 1 tablet (40 mg total) by mouth daily.  Marland Kitchen glimepiride (AMARYL) 4 MG tablet Take 1 tablet (4 mg total) by mouth 2 (two) times daily.  . metFORMIN (GLUCOPHAGE-XR) 500 MG 24 hr tablet Take 1 tablet (500 mg total) by mouth 2 (two) times daily with a meal.  . quinapril (ACCUPRIL) 40 MG tablet Take 1 tablet (40 mg total) by mouth daily.  Marland Kitchen spironolactone (ALDACTONE) 25 MG tablet TAKE 1 TABLET(25 MG) BY MOUTH DAILY  . Vitamin D, Ergocalciferol, (DRISDOL) 50000 units CAPS capsule Take 1 capsule (50,000 Units total) by mouth every 7 (seven) days. (Patient not taking: Reported on 02/23/2017)  . [DISCONTINUED] meloxicam (MOBIC) 15 MG tablet Take 1 tablet (15 mg total) by mouth daily as needed. (Patient not taking: Reported on 02/23/2017)  . [DISCONTINUED] naproxen (NAPROSYN) 500 MG tablet Take 1 tablet (500 mg total) by mouth 2 (two) times daily with a meal. (Patient not taking: Reported on 02/23/2017)   No facility-administered encounter  medications on file as of 02/23/2017.     Activities of Daily Living In your present state of health, do you have any difficulty performing the following activities: 02/23/2017  Hearing? N  Vision? N  Difficulty concentrating or making decisions? N  Walking or climbing stairs? Y  Comment d/t back pain  Dressing or bathing? N  Doing errands, shopping? N  Preparing Food and eating ? N  Using the Toilet? N  In the past six months, have you accidently leaked urine? N  Do you have problems with loss of bowel control? N  Managing your Medications? N  Managing your Finances? N  Housekeeping or managing your Housekeeping? N  Some recent data might be hidden    Patient Care Team: Valerie Roys, DO as PCP - General (Family Medicine) Dimmig, Marcello Moores, MD  as Referring Physician (Orthopedic Surgery) Wynona Canes, MD as Referring Physician (Neurology)   Assessment:     Exercise Activities and Dietary recommendations Current Exercise Habits: Home exercise routine, Type of exercise: walking, Time (Minutes): 30, Frequency (Times/Week): 2, Weekly Exercise (Minutes/Week): 60, Intensity: Mild, Exercise limited by: orthopedic condition(s) (back pain)  Goals    . smoking          Smoking cessation discussed      Fall Risk Fall Risk  02/23/2017 05/03/2016 06/07/2015  Falls in the past year? No No No   Depression Screen PHQ 2/9 Scores 02/23/2017 10/04/2015 04/27/2015 04/27/2015  PHQ - 2 Score 0 0 0 0  PHQ- 9 Score - - 6 -    Cognitive Function     6CIT Screen 02/23/2017  What Year? 0 points  What month? 0 points  What time? 0 points  Count back from 20 0 points  Months in reverse 0 points  Repeat phrase 0 points  Total Score 0    Immunization History  Administered Date(s) Administered  . Influenza, Seasonal, Injecte, Preservative Fre 03/26/2013  . Influenza,inj,Quad PF,6+ Mos 03/25/2014, 03/23/2015, 03/16/2016, 02/23/2017  . Influenza-Unspecified 03/27/2012, 03/26/2013, 03/25/2014, 02/18/2015, 03/23/2015  . Pneumococcal Polysaccharide-23 06/30/2015  . Td 06/19/2014  . Tdap 12/25/2013   Screening Tests Health Maintenance  Topic Date Due  . INFLUENZA VACCINE  01/17/2017  . HEMOGLOBIN A1C  05/05/2017  . FOOT EXAM  07/04/2017  . OPHTHALMOLOGY EXAM  01/17/2018  . PNEUMOCOCCAL POLYSACCHARIDE VACCINE (2) 06/29/2020  . TETANUS/TDAP  06/19/2024  . COLONOSCOPY  07/14/2025  . Hepatitis C Screening  Completed  . HIV Screening  Completed      Plan:    I have personally reviewed and addressed the Medicare Annual Wellness questionnaire and have noted the following in the patient's chart:  A. Medical and social history B. Use of alcohol, tobacco or illicit drugs  C. Current medications and supplements D. Functional ability and  status E.  Nutritional status F.  Physical activity G. Advance directives H. List of other physicians I.  Hospitalizations, surgeries, and ER visits in previous 12 months J.  [place] such as hearing and vision if needed, cognitive and depression L. Referrals and appointments   In addition, I have reviewed and discussed with patient certain preventive protocols, quality metrics, and best practice recommendations. A written personalized care plan for preventive services as well as general preventive health recommendations were provided to patient.   Signed,  Tyler Aas, LPN Nurse Health Advisor   MD Recommendations: none

## 2017-02-23 NOTE — Assessment & Plan Note (Signed)
No pain today. Continue current regimen. Continue to monitor. Call with any concerns.

## 2017-02-23 NOTE — Assessment & Plan Note (Signed)
Under good control. Continue current regimen. Continue to monitor. Call with any concerns. 

## 2017-02-23 NOTE — Progress Notes (Signed)
Subjective:   Rickey Glover. is a 56 y.o. male who presents for Medicare Annual/Subsequent preventive examination.  Review of Systems:  Cardiac Risk Factors include: male gender;obesity (BMI >30kg/m2);diabetes mellitus;dyslipidemia;hypertension     Objective:    Vitals: BP 134/79 (BP Location: Left Arm, Patient Position: Sitting)   Pulse 88   Temp (!) 97.5 F (36.4 C)   Resp 16   Ht 6' 1.5" (1.867 m)   Wt (!) 306 lb (138.8 kg)   SpO2 98%   BMI 39.82 kg/m   Body mass index is 39.82 kg/m.  Tobacco History  Smoking Status  . Current Every Day Smoker  . Packs/day: 0.50  . Years: 20.00  . Types: Cigarettes  Smokeless Tobacco  . Never Used     Ready to quit: Yes Counseling given: Yes   Past Medical History:  Diagnosis Date  . Chronic back pain   . DDD (degenerative disc disease), lumbar   . Diabetes mellitus without complication ([place])   . Hip pain   . Hyperlipidemia   . Hypertension   . Insomnia   . Knee pain, left   . LAP-BAND surgery status   . Sciatica of right side   . Sleep apnea    new DX - has not been for CPAP titration yet  . Vitamin D deficiency    Past Surgical History:  Procedure Laterality Date  . COLONOSCOPY WITH PROPOFOL N/A 07/15/2015   Procedure: COLONOSCOPY WITH PROPOFOL;  Surgeon: Lucilla Lame, MD;  Location: [facility];  Service: Endoscopy;  Laterality: N/A;  Diabetic - oral meds Sleep apnea  . JOINT REPLACEMENT Right 2016  . lap band surgery    . LUMBAR SPINE SURGERY  April 2017   Family History  Problem Relation Age of Onset  . Aneurysm Mother        Brain  . Stroke Maternal Grandmother   . Stroke Paternal Grandmother    History  Sexual Activity  . Sexual activity: Not Currently  . Birth control/ protection: None    Outpatient Encounter Prescriptions as of 02/23/2017  Medication Sig  . amLODipine (NORVASC) 10 MG tablet Take 1 tablet (10 mg total) by mouth daily.  Marland Kitchen atorvastatin (LIPITOR) 80 MG tablet Take 1  tablet (80 mg total) by mouth daily.  . clonazePAM (KLONOPIN) 0.5 MG tablet 1 tablet at bedtime as needed (try every other day at least)  . clopidogrel (PLAVIX) 75 MG tablet TK 1 T PO D  . furosemide (LASIX) 40 MG tablet Take 1 tablet (40 mg total) by mouth daily.  Marland Kitchen glimepiride (AMARYL) 4 MG tablet Take 1 tablet (4 mg total) by mouth 2 (two) times daily.  . metFORMIN (GLUCOPHAGE-XR) 500 MG 24 hr tablet Take 1 tablet (500 mg total) by mouth 2 (two) times daily with a meal.  . quinapril (ACCUPRIL) 40 MG tablet Take 1 tablet (40 mg total) by mouth daily.  Marland Kitchen spironolactone (ALDACTONE) 25 MG tablet TAKE 1 TABLET(25 MG) BY MOUTH DAILY  . Vitamin D, Ergocalciferol, (DRISDOL) 50000 units CAPS capsule Take 1 capsule (50,000 Units total) by mouth every 7 (seven) days. (Patient not taking: Reported on 02/23/2017)  . [DISCONTINUED] meloxicam (MOBIC) 15 MG tablet Take 1 tablet (15 mg total) by mouth daily as needed. (Patient not taking: Reported on 02/23/2017)  . [DISCONTINUED] naproxen (NAPROSYN) 500 MG tablet Take 1 tablet (500 mg total) by mouth 2 (two) times daily with a meal. (Patient not taking: Reported on 02/23/2017)   No facility-administered encounter  medications on file as of 02/23/2017.     Activities of Daily Living In your present state of health, do you have any difficulty performing the following activities: 02/23/2017  Hearing? N  Vision? N  Difficulty concentrating or making decisions? N  Walking or climbing stairs? Y  Comment d/t back pain  Dressing or bathing? N  Doing errands, shopping? N  Preparing Food and eating ? N  Using the Toilet? N  In the past six months, have you accidently leaked urine? N  Do you have problems with loss of bowel control? N  Managing your Medications? N  Managing your Finances? N  Housekeeping or managing your Housekeeping? N  Some recent data might be hidden    Patient Care Team: Valerie Roys, DO as PCP - General (Family Medicine) Dimmig, Marcello Moores, MD  as Referring Physician (Orthopedic Surgery) Wynona Canes, MD as Referring Physician (Neurology)   Assessment:     Exercise Activities and Dietary recommendations Current Exercise Habits: Home exercise routine, Type of exercise: walking, Time (Minutes): 30, Frequency (Times/Week): 2, Weekly Exercise (Minutes/Week): 60, Intensity: Mild, Exercise limited by: orthopedic condition(s) (back pain)  Goals    . smoking          Smoking cessation discussed      Fall Risk Fall Risk  02/23/2017 05/03/2016 06/07/2015  Falls in the past year? No No No   Depression Screen PHQ 2/9 Scores 02/23/2017 10/04/2015 04/27/2015 04/27/2015  PHQ - 2 Score 0 0 0 0  PHQ- 9 Score - - 6 -    Cognitive Function     6CIT Screen 02/23/2017  What Year? 0 points  What month? 0 points  What time? 0 points  Count back from 20 0 points  Months in reverse 0 points  Repeat phrase 0 points  Total Score 0    Immunization History  Administered Date(s) Administered  . Influenza, Seasonal, Injecte, Preservative Fre 03/26/2013  . Influenza,inj,Quad PF,6+ Mos 03/25/2014, 03/23/2015, 03/16/2016, 02/23/2017  . Influenza-Unspecified 03/27/2012, 03/26/2013, 03/25/2014, 02/18/2015, 03/23/2015  . Pneumococcal Polysaccharide-23 06/30/2015  . Td 06/19/2014  . Tdap 12/25/2013   Screening Tests Health Maintenance  Topic Date Due  . INFLUENZA VACCINE  01/17/2017  . HEMOGLOBIN A1C  05/05/2017  . FOOT EXAM  07/04/2017  . OPHTHALMOLOGY EXAM  01/17/2018  . PNEUMOCOCCAL POLYSACCHARIDE VACCINE (2) 06/29/2020  . TETANUS/TDAP  06/19/2024  . COLONOSCOPY  07/14/2025  . Hepatitis C Screening  Completed  . HIV Screening  Completed      Plan:

## 2017-02-23 NOTE — Assessment & Plan Note (Signed)
Will get him back in to see orthopedics. Appointment scheduled today.

## 2017-02-23 NOTE — Assessment & Plan Note (Signed)
Rechecking levels today. Await results.  

## 2017-02-23 NOTE — Assessment & Plan Note (Signed)
Rechecking levels today. Await results. Call with any concerns.  

## 2017-02-23 NOTE — Progress Notes (Signed)
BP 134/79   Pulse 88   Temp (!) 97.5 F (36.4 C)   Resp 16   Ht 6' 1.5" (1.867 m)   Wt (!) 306 lb (138.8 kg)   SpO2 98%   BMI 39.82 kg/m    Subjective:    Patient ID: Rickey Glover., male    DOB: 08-20-1960, 56 y.o.   MRN: [id number]  HPI: Laird Runnion. is a 56 y.o. male  Chief Complaint  Patient presents with  . Hypertension  . Hyperlipidemia  . Diabetes   HYPERTENSION / HYPERLIPIDEMIA Satisfied with current treatment? yes Duration of hypertension: chronic BP monitoring frequency: not checking BP medication side effects: no Past BP meds:  Duration of hyperlipidemia: chronic Cholesterol medication side effects: no Cholesterol supplements: none Past cholesterol medications:  Medication compliance: excellent compliance Aspirin: no Recent stressors: yes Recurrent headaches: no Visual changes: no Palpitations: no Dyspnea: no Chest pain: no Lower extremity edema: no Dizzy/lightheaded: no  DIABETES Hypoglycemic episodes:no Polydipsia/polyuria: no Visual disturbance: no Chest pain: no Paresthesias: no Glucose Monitoring: no Taking Insulin?: no Blood Pressure Monitoring: not checking Retinal Examination: Up to Date Foot Exam: Up to Date Diabetic Education: Completed Pneumovax: Up to Date Influenza: Up to Date Aspirin: yes  Still in a lot of pain in his back. Has been seeing orthopedics, but they wanted him to have an MRI and he couldn't afford it, so he hasn't seen them. Was due to see them again in July. He notes that his back has been hurting a lot and radiating into his L leg and thigh. His L knee is also acting up and swelling again. He would like to have another shot in his knee. He is otherwise pretty stressed as his fiance's parents were both diagnosed with pancreatic cancer. He is otherwise doing OK with no other concerns or complaints at this time.   Relevant past medical, surgical, family and social history reviewed and updated as  indicated. Interim medical history since our last visit reviewed. Allergies and medications reviewed and updated.  Review of Systems  Constitutional: Negative.   Respiratory: Negative.   Cardiovascular: Negative.   Musculoskeletal: Positive for back pain and myalgias. Negative for arthralgias, gait problem, joint swelling, neck pain and neck stiffness.  Neurological: Negative.   Psychiatric/Behavioral: Negative.     Per HPI unless specifically indicated above     Objective:    BP 134/79   Pulse 88   Temp (!) 97.5 F (36.4 C)   Resp 16   Ht 6' 1.5" (1.867 m)   Wt (!) 306 lb (138.8 kg)   SpO2 98%   BMI 39.82 kg/m   Wt Readings from Last 3 Encounters:  02/23/17 (!) 306 lb (138.8 kg)  02/23/17 (!) 306 lb (138.8 kg)  11/13/16 286 lb (129.7 kg)    Physical Exam  Constitutional: He is oriented to person, place, and time. He appears well-developed and well-nourished. No distress.  HENT:  Head: Normocephalic and atraumatic.  Right Ear: Hearing normal.  Left Ear: Hearing normal.  Nose: Nose normal.  Eyes: Conjunctivae and lids are normal. Right eye exhibits no discharge. Left eye exhibits no discharge. No scleral icterus.  Cardiovascular: Normal rate, regular rhythm, normal heart sounds and intact distal pulses.  Exam reveals no gallop and no friction rub.   No murmur heard. Pulmonary/Chest: Effort normal and breath sounds normal. No respiratory distress. He has no wheezes. He has no rales. He exhibits no tenderness.  Musculoskeletal: Normal range of motion.  Neurological: He is alert and oriented to person, place, and time.  Skin: Skin is warm, dry and intact. No rash noted. He is not diaphoretic. No erythema. No pallor.  Psychiatric: He has a normal mood and affect. His speech is normal and behavior is normal. Judgment and thought content normal. Cognition and memory are normal.  Nursing note and vitals reviewed.   Results for orders placed or performed in visit on 11/02/16    Bayer DCA Hb A1c Waived  Result Value Ref Range   Bayer DCA Hb A1c Waived 7.3 (H) <7.0 %      Assessment & Plan:   Problem List Items Addressed This Visit      Cardiovascular and Mediastinum   Essential hypertension - Primary    Under good control. Continue current regimen. Continue to monitor. Call with any concerns.       Relevant Medications   amLODipine (NORVASC) 10 MG tablet   furosemide (LASIX) 40 MG tablet   quinapril (ACCUPRIL) 40 MG tablet   spironolactone (ALDACTONE) 25 MG tablet   Other Relevant Orders   Comprehensive metabolic panel   TSH   UA/M w/rflx Culture, Routine   CAD (coronary artery disease)    No pain today. Continue current regimen. Continue to monitor. Call with any concerns.       Relevant Medications   amLODipine (NORVASC) 10 MG tablet   furosemide (LASIX) 40 MG tablet   quinapril (ACCUPRIL) 40 MG tablet   spironolactone (ALDACTONE) 25 MG tablet   Other Relevant Orders   Comprehensive metabolic panel   TSH   UA/M w/rflx Culture, Routine     Endocrine   Type 2 diabetes mellitus with hyperglycemia, without long-term current use of insulin (HCC)    Under good control. Continue current regimen. Continue to monitor. Call with any concerns.       Relevant Medications   glimepiride (AMARYL) 4 MG tablet   quinapril (ACCUPRIL) 40 MG tablet   metFORMIN (GLUCOPHAGE-XR) 500 MG 24 hr tablet   Other Relevant Orders   Comprehensive metabolic panel   Hgb O6Z w/o eAG   TSH   UA/M w/rflx Culture, Routine     Musculoskeletal and Integument   DDD (degenerative disc disease), lumbar    Continuing to act up. Referral to [facility] made today to try to get help with paying for MRI. Declines PT today. Appointment with orthopedics made today. Call with any concerns.       Relevant Medications   naproxen (NAPROSYN) 500 MG tablet   Arthritis of knee, degenerative    Will get him back in to see orthopedics. Appointment scheduled today.      Relevant Medications    naproxen (NAPROSYN) 500 MG tablet     Other   HLD (hyperlipidemia)    Rechecking levels today. Await results. Call with any concerns.       Relevant Medications   amLODipine (NORVASC) 10 MG tablet   furosemide (LASIX) 40 MG tablet   quinapril (ACCUPRIL) 40 MG tablet   spironolactone (ALDACTONE) 25 MG tablet   Other Relevant Orders   Comprehensive metabolic panel   Lipid Panel w/o Chol/HDL Ratio   TSH   UA/M w/rflx Culture, Routine   Leukocytosis    Rechecking levels today. Await results.       Relevant Orders   CBC with Differential/Platelet   Comprehensive metabolic panel   TSH   UA/M w/rflx Culture, Routine    Other Visit Diagnoses    Screening for prostate cancer  Labs drawn today, await results.    Relevant Orders   PSA       Follow up plan: Return in about 3 months (around 05/25/2017) for Follow up DM.

## 2017-02-23 NOTE — Assessment & Plan Note (Addendum)
Under good control. Continue current regimen. Continue to monitor. Call with any concerns. 

## 2017-02-23 NOTE — Patient Instructions (Addendum)
Rickey Glover , Thank you for taking time to come for your Medicare Wellness Visit. I appreciate your ongoing commitment to your health goals. Please review the following plan we discussed and let me know if I can assist you in the future.   Screening recommendations/referrals: Colonoscopy: completed 07/15/2015 Recommended yearly ophthalmology/optometry visit for glaucoma screening and checkup Recommended yearly dental visit for hygiene and checkup  Vaccinations: Influenza vaccine: due now Pneumococcal vaccine: due at age 7 Tdap vaccine: up to date  Shingles vaccine: due at age 65   Advanced directives: Advance directive discussed with you today. I have provided a copy for you to complete at home and have notarized. Once this is complete please bring a copy in to our office so we can scan it into your chart.  Conditions/risks identified: smoking cessation discussed  Next appointment: follow up in one year for your annual wellness exam   Preventive Care 40-64 Years, Male Preventive care refers to lifestyle choices and visits with your health care provider that can promote health and wellness. What does preventive care include?  A yearly physical exam. This is also called an annual well check.  Dental exams once or twice a year.  Routine eye exams. Ask your health care provider how often you should have your eyes checked.  Personal lifestyle choices, including:  Daily care of your teeth and gums.  Regular physical activity.  Eating a healthy diet.  Avoiding tobacco and drug use.  Limiting alcohol use.  Practicing safe sex.  Taking low-dose aspirin every day starting at age 32. What happens during an annual well check? The services and screenings done by your health care provider during your annual well check will depend on your age, overall health, lifestyle risk factors, and family history of disease. Counseling  Your health care provider may ask you questions about  your:  Alcohol use.  Tobacco use.  Drug use.  Emotional well-being.  Home and relationship well-being.  Sexual activity.  Eating habits.  Work and work Statistician. Screening  You may have the following tests or measurements:  Height, weight, and BMI.  Blood pressure.  Lipid and cholesterol levels. These may be checked every 5 years, or more frequently if you are over 58 years old.  Skin check.  Lung cancer screening. You may have this screening every year starting at age 48 if you have a 30-pack-year history of smoking and currently smoke or have quit within the past 15 years.  Fecal occult blood test (FOBT) of the stool. You may have this test every year starting at age 36.  Flexible sigmoidoscopy or colonoscopy. You may have a sigmoidoscopy every 5 years or a colonoscopy every 10 years starting at age 66.  Prostate cancer screening. Recommendations will vary depending on your family history and other risks.  Hepatitis C blood test.  Hepatitis B blood test.  Sexually transmitted disease (STD) testing.  Diabetes screening. This is done by checking your blood sugar (glucose) after you have not eaten for a while (fasting). You may have this done every 1-3 years. Discuss your test results, treatment options, and if necessary, the need for more tests with your health care provider. Vaccines  Your health care provider may recommend certain vaccines, such as:  Influenza vaccine. This is recommended every year.  Tetanus, diphtheria, and acellular pertussis (Tdap, Td) vaccine. You may need a Td booster every 10 years.  Zoster vaccine. You may need this after age 36.  Pneumococcal 13-valent conjugate (PCV13) vaccine.  You may need this if you have certain conditions and have not been vaccinated.  Pneumococcal polysaccharide (PPSV23) vaccine. You may need one or two doses if you smoke cigarettes or if you have certain conditions. Talk to your health care provider about  which screenings and vaccines you need and how often you need them. This information is not intended to replace advice given to you by your health care provider. Make sure you discuss any questions you have with your health care provider. Document Released: 07/02/2015 Document Revised: 02/23/2016 Document Reviewed: 04/06/2015 Elsevier Interactive Patient Education  2017 [place] Prevention in the Home Falls can cause injuries. They can happen to people of all ages. There are many things you can do to make your home safe and to help prevent falls. What can I do on the outside of my home?  Regularly fix the edges of walkways and driveways and fix any cracks.  Remove anything that might make you trip as you walk through a door, such as a raised step or threshold.  Trim any bushes or trees on the path to your home.  Use bright outdoor lighting.  Clear any walking paths of anything that might make someone trip, such as rocks or tools.  Regularly check to see if handrails are loose or broken. Make sure that both sides of any steps have handrails.  Any raised decks and porches should have guardrails on the edges.  Have any leaves, snow, or ice cleared regularly.  Use sand or salt on walking paths during winter.  Clean up any spills in your garage right away. This includes oil or grease spills. What can I do in the bathroom?  Use night lights.  Install grab bars by the toilet and in the tub and shower. Do not use towel bars as grab bars.  Use non-skid mats or decals in the tub or shower.  If you need to sit down in the shower, use a plastic, non-slip stool.  Keep the floor dry. Clean up any water that spills on the floor as soon as it happens.  Remove soap buildup in the tub or shower regularly.  Attach bath mats securely with double-sided non-slip rug tape.  Do not have throw rugs and other things on the floor that can make you trip. What can I do in the  bedroom?  Use night lights.  Make sure that you have a light by your bed that is easy to reach.  Do not use any sheets or blankets that are too big for your bed. They should not hang down onto the floor.  Have a firm chair that has side arms. You can use this for support while you get dressed.  Do not have throw rugs and other things on the floor that can make you trip. What can I do in the kitchen?  Clean up any spills right away.  Avoid walking on wet floors.  Keep items that you use a lot in easy-to-reach places.  If you need to reach something above you, use a strong step stool that has a grab bar.  Keep electrical cords out of the way.  Do not use floor polish or wax that makes floors slippery. If you must use wax, use non-skid floor wax.  Do not have throw rugs and other things on the floor that can make you trip. What can I do with my stairs?  Do not leave any items on the stairs.  Make sure that  there are handrails on both sides of the stairs and use them. Fix handrails that are broken or loose. Make sure that handrails are as long as the stairways.  Check any carpeting to make sure that it is firmly attached to the stairs. Fix any carpet that is loose or worn.  Avoid having throw rugs at the top or bottom of the stairs. If you do have throw rugs, attach them to the floor with carpet tape.  Make sure that you have a light switch at the top of the stairs and the bottom of the stairs. If you do not have them, ask someone to add them for you. What else can I do to help prevent falls?  Wear shoes that:  Do not have high heels.  Have rubber bottoms.  Are comfortable and fit you well.  Are closed at the toe. Do not wear sandals.  If you use a stepladder:  Make sure that it is fully opened. Do not climb a closed stepladder.  Make sure that both sides of the stepladder are locked into place.  Ask someone to hold it for you, if possible.  Clearly mark and make  sure that you can see:  Any grab bars or handrails.  First and last steps.  Where the edge of each step is.  Use tools that help you move around (mobility aids) if they are needed. These include:  Canes.  Walkers.  Scooters.  Crutches.  Turn on the lights when you go into a dark area. Replace any light bulbs as soon as they burn out.  Set up your furniture so you have a clear path. Avoid moving your furniture around.  If any of your floors are uneven, fix them.  If there are any pets around you, be aware of where they are.  Review your medicines with your doctor. Some medicines can make you feel dizzy. This can increase your chance of falling. Ask your doctor what other things that you can do to help prevent falls. This information is not intended to replace advice given to you by your health care provider. Make sure you discuss any questions you have with your health care provider. Document Released: 04/01/2009 Document Revised: 11/11/2015 Document Reviewed: 07/10/2014 Elsevier Interactive Patient Education  2017 [place].  Influenza (Flu) Vaccine (Inactivated or Recombinant): What You Need to Know 1. Why get vaccinated? Influenza ("flu") is a contagious disease that spreads around the [place] every year, usually between October and May. Flu is caused by influenza viruses, and is spread mainly by coughing, sneezing, and close contact. Anyone can get flu. Flu strikes suddenly and can last several days. Symptoms vary by age, but can include:  fever/chills  sore throat  muscle aches  fatigue  cough  headache  runny or stuffy nose  Flu can also lead to pneumonia and blood infections, and cause diarrhea and seizures in children. If you have a medical condition, such as heart or lung disease, flu can make it worse. Flu is more dangerous for some people. Infants and young children, people 59 years of age and older, pregnant women, and people with certain  health conditions or a weakened immune system are at greatest risk. Each year thousands of people in the [place] die from flu, and many more are hospitalized. Flu vaccine can:  keep you from getting flu,  make flu less severe if you do get it, and  keep you from spreading flu to your family and other  people. 2. Inactivated and recombinant flu vaccines A dose of flu vaccine is recommended every flu season. Children 6 months through 61 years of age may need two doses during the same flu season. Everyone else needs only one dose each flu season. Some inactivated flu vaccines contain a very small amount of a mercury-based preservative called thimerosal. Studies have not shown thimerosal in vaccines to be harmful, but flu vaccines that do not contain thimerosal are available. There is no live flu virus in flu shots. They cannot cause the flu. There are many flu viruses, and they are always changing. Each year a new flu vaccine is made to protect against three or four viruses that are likely to cause disease in the upcoming flu season. But even when the vaccine doesn't exactly match these viruses, it may still provide some protection. Flu vaccine cannot prevent:  flu that is caused by a virus not covered by the vaccine, or  illnesses that look like flu but are not.  It takes about 2 weeks for protection to develop after vaccination, and protection lasts through the flu season. 3. Some people should not get this vaccine Tell the person who is giving you the vaccine:  If you have any severe, life-threatening allergies. If you ever had a life-threatening allergic reaction after a dose of flu vaccine, or have a severe allergy to any part of this vaccine, you may be advised not to get vaccinated. Most, but not all, types of flu vaccine contain a small amount of egg protein.  If you ever had Guillain-Barr Syndrome (also called GBS). Some people with a history of GBS should not get this vaccine.  This should be discussed with your doctor.  If you are not feeling well. It is usually okay to get flu vaccine when you have a mild illness, but you might be asked to come back when you feel better.  4. Risks of a vaccine reaction With any medicine, including vaccines, there is a chance of reactions. These are usually mild and go away on their own, but serious reactions are also possible. Most people who get a flu shot do not have any problems with it. Minor problems following a flu shot include:  soreness, redness, or swelling where the shot was given  hoarseness  sore, red or itchy eyes  cough  fever  aches  headache  itching  fatigue  If these problems occur, they usually begin soon after the shot and last 1 or 2 days. More serious problems following a flu shot can include the following:  There may be a small increased risk of Guillain-Barre Syndrome (GBS) after inactivated flu vaccine. This risk has been estimated at 1 or 2 additional cases per million people vaccinated. This is much lower than the risk of severe complications from flu, which can be prevented by flu vaccine.  Young children who get the flu shot along with pneumococcal vaccine (PCV13) and/or DTaP vaccine at the same time might be slightly more likely to have a seizure caused by fever. Ask your doctor for more information. Tell your doctor if a child who is getting flu vaccine has ever had a seizure.  Problems that could happen after any injected vaccine:  People sometimes faint after a medical procedure, including vaccination. Sitting or lying down for about 15 minutes can help prevent fainting, and injuries caused by a fall. Tell your doctor if you feel dizzy, or have vision changes or ringing in the ears.  Some people  get severe pain in the shoulder and have difficulty moving the arm where a shot was given. This happens very rarely.  Any medication can cause a severe allergic reaction. Such reactions from  a vaccine are very rare, estimated at about 1 in a million doses, and would happen within a few minutes to a few hours after the vaccination. As with any medicine, there is a very remote chance of a vaccine causing a serious injury or death. The safety of vaccines is always being monitored. For more information, visit: http://www.aguilar.org/ 5. What if there is a serious reaction? What should I look for? Look for anything that concerns you, such as signs of a severe allergic reaction, very high fever, or unusual behavior. Signs of a severe allergic reaction can include hives, swelling of the face and throat, difficulty breathing, a fast heartbeat, dizziness, and weakness. These would start a few minutes to a few hours after the vaccination. What should I do?  If you think it is a severe allergic reaction or other emergency that can't wait, call 9-1-1 and get the person to the nearest hospital. Otherwise, call your doctor.  Reactions should be reported to the Vaccine Adverse Event Reporting System (VAERS). Your doctor should file this report, or you can do it yourself through the VAERS web site at www.vaers.SamedayNews.es, or by calling 413-400-5175. ? VAERS does not give medical advice. 6. The National Vaccine Injury Compensation Program The [employer] Vaccine Injury Compensation Program (VICP) is a federal program that was created to compensate people who may have been injured by certain vaccines. Persons who believe they may have been injured by a vaccine can learn about the program and about filing a claim by calling (463)647-1894 or visiting the [employer] website at GoldCloset.com.ee. There is a time limit to file a claim for compensation. 7. How can I learn more?  Ask your healthcare provider. He or she can give you the vaccine package insert or suggest other sources of information.  Call your local or state health department.  Contact the Centers for Disease Control and Prevention  (CDC): ? Call 850-385-7256 (1-800-CDC-INFO) or ? Visit CDC's website at https://gibson.com/ Vaccine Information Statement, Inactivated Influenza Vaccine (01/23/2014) This information is not intended to replace advice given to you by your health care provider. Make sure you discuss any questions you have with your health care provider. Document Released: 03/30/2006 Document Revised: 02/24/2016 Document Reviewed: 02/24/2016 Elsevier Interactive Patient Education  2017 [employer].

## 2017-02-24 LAB — CBC WITH DIFFERENTIAL/PLATELET
BASOS: 0 %
Basophils Absolute: 0 10*3/uL (ref 0.0–0.2)
EOS (ABSOLUTE): 0.3 10*3/uL (ref 0.0–0.4)
EOS: 2 %
HEMATOCRIT: 41.6 % (ref 37.5–51.0)
Hemoglobin: 13.6 g/dL (ref 13.0–17.7)
IMMATURE GRANS (ABS): 0.1 10*3/uL (ref 0.0–0.1)
IMMATURE GRANULOCYTES: 1 %
LYMPHS: 25 %
Lymphocytes Absolute: 3.5 10*3/uL — ABNORMAL HIGH (ref 0.7–3.1)
MCH: 28.2 pg (ref 26.6–33.0)
MCHC: 32.7 g/dL (ref 31.5–35.7)
MCV: 86 fL (ref 79–97)
Monocytes Absolute: 0.6 10*3/uL (ref 0.1–0.9)
Monocytes: 4 %
NEUTROS PCT: 68 %
Neutrophils Absolute: 9.4 10*3/uL — ABNORMAL HIGH (ref 1.4–7.0)
PLATELETS: 327 10*3/uL (ref 150–379)
RBC: 4.83 x10E6/uL (ref 4.14–5.80)
RDW: 14 % (ref 12.3–15.4)
WBC: 13.9 10*3/uL — ABNORMAL HIGH (ref 3.4–10.8)

## 2017-02-24 LAB — LIPID PANEL W/O CHOL/HDL RATIO
CHOLESTEROL TOTAL: 113 mg/dL (ref 100–199)
HDL: 48 mg/dL (ref >39)
LDL CALC: 54 mg/dL (ref 0–99)
TRIGLYCERIDES: 54 mg/dL (ref 0–149)
VLDL Cholesterol Cal: 11 mg/dL (ref 5–40)

## 2017-02-24 LAB — UA/M W/RFLX CULTURE, ROUTINE
Leukocytes, UA: NEGATIVE
PH UA: 5.5 (ref 5.0–7.5)
Protein, UA: NEGATIVE
SPEC GRAV UA: 1.02 (ref 1.005–1.030)
UUROB: 1 mg/dL (ref 0.2–1.0)

## 2017-02-24 LAB — COMPREHENSIVE METABOLIC PANEL
ALBUMIN: 4.3 g/dL (ref 3.5–5.5)
ALT: 20 IU/L (ref 0–44)
AST: 17 IU/L (ref 0–40)
Albumin/Globulin Ratio: 1.6 (ref 1.2–2.2)
Alkaline Phosphatase: 91 IU/L (ref 39–117)
BUN/Creatinine Ratio: 9 (ref 9–20)
BUN: 11 mg/dL (ref 6–24)
Bilirubin Total: 0.4 mg/dL (ref 0.0–1.2)
CALCIUM: 9.7 mg/dL (ref 8.7–10.2)
CHLORIDE: 102 mmol/L (ref 96–106)
CO2: 23 mmol/L (ref 20–29)
Creatinine, Ser: 1.2 mg/dL (ref 0.76–1.27)
GFR, EST AFRICAN AMERICAN: 79 mL/min/{1.73_m2} (ref >59)
GFR, EST NON AFRICAN AMERICAN: 68 mL/min/{1.73_m2} (ref >59)
GLOBULIN, TOTAL: 2.7 g/dL (ref 1.5–4.5)
Glucose: 248 mg/dL — ABNORMAL HIGH (ref 65–99)
POTASSIUM: 4.7 mmol/L (ref 3.5–5.2)
SODIUM: 141 mmol/L (ref 134–144)
Total Protein: 7 g/dL (ref 6.0–8.5)

## 2017-02-24 LAB — HGB A1C W/O EAG: Hgb A1c MFr Bld: 8.3 % — ABNORMAL HIGH (ref 4.8–5.6)

## 2017-02-24 LAB — PSA: PROSTATE SPECIFIC AG, SERUM: 1.6 ng/mL (ref 0.0–4.0)

## 2017-02-24 LAB — TSH: TSH: 1.92 u[IU]/mL (ref 0.450–4.500)

## 2017-02-26 ENCOUNTER — Telehealth: Payer: Self-pay | Admitting: Family Medicine

## 2017-02-26 ENCOUNTER — Ambulatory Visit: Payer: Medicare HMO | Admitting: Diagnostic Neuroimaging

## 2017-02-26 NOTE — Telephone Encounter (Signed)
Spoke with patient to clarify medication directions.

## 2017-02-26 NOTE — Telephone Encounter (Signed)
Called Rickey Glover to give him his results. LMOM for him to call back. A1c is up to 8.3! Need to bring it back down again. Other labs look good. Need to make sure he's taking his medicine. If he is we will increase his metformin from 500mg  BID to 1000mg  BID and recheck in 3 months.

## 2017-03-01 DIAGNOSIS — M1712 Unilateral primary osteoarthritis, left knee: Secondary | ICD-10-CM | POA: Diagnosis not present

## 2017-03-05 ENCOUNTER — Ambulatory Visit: Payer: 59 | Admitting: Family Medicine

## 2017-03-07 DIAGNOSIS — F17210 Nicotine dependence, cigarettes, uncomplicated: Secondary | ICD-10-CM | POA: Diagnosis not present

## 2017-03-07 DIAGNOSIS — R0789 Other chest pain: Secondary | ICD-10-CM | POA: Diagnosis not present

## 2017-03-07 DIAGNOSIS — R42 Dizziness and giddiness: Secondary | ICD-10-CM | POA: Diagnosis not present

## 2017-03-07 DIAGNOSIS — J9811 Atelectasis: Secondary | ICD-10-CM | POA: Diagnosis not present

## 2017-03-07 DIAGNOSIS — I10 Essential (primary) hypertension: Secondary | ICD-10-CM | POA: Diagnosis not present

## 2017-03-09 ENCOUNTER — Encounter: Payer: Self-pay | Admitting: Family Medicine

## 2017-03-09 ENCOUNTER — Ambulatory Visit (INDEPENDENT_AMBULATORY_CARE_PROVIDER_SITE_OTHER): Payer: 59 | Admitting: Family Medicine

## 2017-03-09 ENCOUNTER — Other Ambulatory Visit: Payer: Self-pay | Admitting: Family Medicine

## 2017-03-09 VITALS — BP 124/82 | HR 83 | Temp 98.6°F | Wt 302.0 lb

## 2017-03-09 DIAGNOSIS — R079 Chest pain, unspecified: Secondary | ICD-10-CM

## 2017-03-09 DIAGNOSIS — F5101 Primary insomnia: Secondary | ICD-10-CM

## 2017-03-09 MED ORDER — CLONAZEPAM 0.5 MG PO TABS: ORAL_TABLET | ORAL | 0 refills | Status: DC

## 2017-03-09 NOTE — Assessment & Plan Note (Signed)
Klonopin refilled for prn use. Script should last 2 months or so. Continue current regimen. Precautions reviewed

## 2017-03-09 NOTE — Progress Notes (Signed)
BP 124/82   Pulse 83   Temp 98.6 F (37 C)   Wt (!) 302 lb (137 kg)   SpO2 98%   BMI 39.30 kg/m    Subjective:    Patient ID: Rickey Glover., male    DOB: 1961/01/08, 56 y.o.   MRN: [id number]  HPI: Rickey Dente. is a 56 y.o. male  Chief Complaint  Patient presents with  . Follow-up    was seen for CP and told he needed a referral to cardiology  . Medication Refill    he needs a refill on Clonazepam.   Patient presents today for ER f/u. Presented with CP and diaphoresis. Workup negative for MI, but recommended stress test/f/u with Cardiology after discharge. Pain has subsided, but still having occasional sweats. Does have hx of DM, HTN, and CAD.   Also needing a refill on his klonopin for insomnia. Does not take every night. Working well, no side effects noted.   Relevant past medical, surgical, family and social history reviewed and updated as indicated. Interim medical history since our last visit reviewed. Allergies and medications reviewed and updated.  Review of Systems  Constitutional: Positive for diaphoresis.  Respiratory: Negative.   Cardiovascular: Negative.   Musculoskeletal: Negative.   Neurological: Negative.   Psychiatric/Behavioral: Negative.    Per HPI unless specifically indicated above     Objective:    BP 124/82   Pulse 83   Temp 98.6 F (37 C)   Wt (!) 302 lb (137 kg)   SpO2 98%   BMI 39.30 kg/m   Wt Readings from Last 3 Encounters:  03/09/17 (!) 302 lb (137 kg)  02/23/17 (!) 306 lb (138.8 kg)  02/23/17 (!) 306 lb (138.8 kg)    Physical Exam  Constitutional: He is oriented to person, place, and time. He appears well-developed and well-nourished. No distress.  HENT:  Head: Atraumatic.  Eyes: Pupils are equal, round, and reactive to light. Conjunctivae are normal.  Neck: Normal range of motion. Neck supple.  Cardiovascular: Normal rate and normal heart sounds.   Pulmonary/Chest: Effort normal and breath sounds normal.  No respiratory distress.  Musculoskeletal: Normal range of motion.  Neurological: He is alert and oriented to person, place, and time.  Skin: Skin is warm and dry.  Psychiatric: He has a normal mood and affect. His behavior is normal.  Nursing note and vitals reviewed.  Results for orders placed or performed in visit on 02/23/17  CBC with Differential/Platelet  Result Value Ref Range   WBC 13.9 (H) 3.4 - 10.8 x10E3/uL   RBC 4.83 4.14 - 5.80 x10E6/uL   Hemoglobin 13.6 13.0 - 17.7 g/dL   Hematocrit 41.6 37.5 - 51.0 %   MCV 86 79 - 97 fL   MCH 28.2 26.6 - 33.0 pg   MCHC 32.7 31.5 - 35.7 g/dL   RDW 14.0 12.3 - 15.4 %   Platelets 327 150 - 379 x10E3/uL   Neutrophils 68 Not Estab. %   Lymphs 25 Not Estab. %   Monocytes 4 Not Estab. %   Eos 2 Not Estab. %   Basos 0 Not Estab. %   Neutrophils Absolute 9.4 (H) 1.4 - 7.0 x10E3/uL   Lymphocytes Absolute 3.5 (H) 0.7 - 3.1 x10E3/uL   Monocytes Absolute 0.6 0.1 - 0.9 x10E3/uL   EOS (ABSOLUTE) 0.3 0.0 - 0.4 x10E3/uL   Basophils Absolute 0.0 0.0 - 0.2 x10E3/uL   Immature Granulocytes 1 Not Estab. %   Immature Grans (Abs)  0.1 0.0 - 0.1 x10E3/uL  Comprehensive metabolic panel  Result Value Ref Range   Glucose 248 (H) 65 - 99 mg/dL   BUN 11 6 - 24 mg/dL   Creatinine, Ser 1.20 0.76 - 1.27 mg/dL   GFR calc non Af Amer 68 >59 mL/min/1.73   GFR calc Af Amer 79 >59 mL/min/1.73   BUN/Creatinine Ratio 9 9 - 20   Sodium 141 134 - 144 mmol/L   Potassium 4.7 3.5 - 5.2 mmol/L   Chloride 102 96 - 106 mmol/L   CO2 23 20 - 29 mmol/L   Calcium 9.7 8.7 - 10.2 mg/dL   Total Protein 7.0 6.0 - 8.5 g/dL   Albumin 4.3 3.5 - 5.5 g/dL   Globulin, Total 2.7 1.5 - 4.5 g/dL   Albumin/Globulin Ratio 1.6 1.2 - 2.2   Bilirubin Total 0.4 0.0 - 1.2 mg/dL   Alkaline Phosphatase 91 39 - 117 IU/L   AST 17 0 - 40 IU/L   ALT 20 0 - 44 IU/L  Hgb A1c w/o eAG  Result Value Ref Range   Hgb A1c MFr Bld 8.3 (H) 4.8 - 5.6 %  Lipid Panel w/o Chol/HDL Ratio  Result Value  Ref Range   Cholesterol, Total 113 100 - 199 mg/dL   Triglycerides 54 0 - 149 mg/dL   HDL 48 >39 mg/dL   VLDL Cholesterol Cal 11 5 - 40 mg/dL   LDL Calculated 54 0 - 99 mg/dL  PSA  Result Value Ref Range   Prostate Specific Ag, Serum 1.6 0.0 - 4.0 ng/mL  TSH  Result Value Ref Range   TSH 1.920 0.450 - 4.500 uIU/mL  UA/M w/rflx Culture, Routine  Result Value Ref Range   Specific Gravity, UA 1.020 1.005 - 1.030   pH, UA 5.5 5.0 - 7.5   Color, UA Yellow Yellow   Appearance Ur Clear Clear   Leukocytes, UA Negative Negative   Protein, UA Negative Negative/Trace   Glucose, UA 3+ (A) Negative   Ketones, UA Trace (A) Negative   Urobilinogen, Ur 1.0 0.2 - 1.0 mg/dL      Assessment & Plan:   Problem List Items Addressed This Visit      Other   Insomnia    Klonopin refilled for prn use. Script should last 2 months or so. Continue current regimen. Precautions reviewed       Other Visit Diagnoses    Chest pain, unspecified type    -  Primary   Referral generated to Cardiology. Pain has subsided since d/c. Return precautions reviewed, f/u with Cardiology for further evaluation.    Relevant Orders   Ambulatory referral to Cardiology       Follow up plan: Return for as scheduled.

## 2017-03-09 NOTE — Patient Instructions (Signed)
Follow up as needed

## 2017-03-18 ENCOUNTER — Other Ambulatory Visit: Payer: Self-pay | Admitting: Family Medicine

## 2017-03-18 DIAGNOSIS — G453 Amaurosis fugax: Secondary | ICD-10-CM

## 2017-04-10 ENCOUNTER — Telehealth: Payer: Self-pay | Admitting: Family Medicine

## 2017-04-10 NOTE — Telephone Encounter (Signed)
Copied from [place] [id number]. Topic: Quick Communication - See Telephone Encounter >> Apr 10, 2017  8:09 AM Ether Griffins B wrote: CRM for notification. See Telephone encounter for: 04/10/17.  Pt wanting to know about a referral and scheduling an MRI on his back.

## 2017-04-10 NOTE — Telephone Encounter (Signed)
Please call patient in regards to referral for help with the cost of an MRI, patient has not been contacted.

## 2017-04-16 NOTE — Telephone Encounter (Signed)
Patient called back

## 2017-04-16 NOTE — Telephone Encounter (Signed)
Pt returned your call, please call [phone]

## 2017-04-16 NOTE — Telephone Encounter (Signed)
Were you able to contact this patient?

## 2017-04-20 ENCOUNTER — Encounter: Payer: Self-pay | Admitting: Family Medicine

## 2017-04-23 ENCOUNTER — Ambulatory Visit: Payer: Self-pay | Admitting: *Deleted

## 2017-04-23 NOTE — Telephone Encounter (Signed)
   Reason for Disposition . [1] Hives has occurred 3 or more times in the last year AND [2] the cause was not found  Answer Assessment - Initial Assessment Questions 1. APPEARANCE: "What does the rash look like?"      Hard area that itches 2. LOCATION: "Where is the rash located?"      Chest- more on left side 3. NUMBER: "How many hives are there?"      One big area 4. SIZE: "How big are the hives?" (inches, cm, compare to coins) "Do they all look the same or is there lots of variation in shape and size?"      Area as big as hand- above the breast area 5. ONSET: "When did the hives begin?" (Hours or days ago)      1 1/2 months ago- went away and came back 6. ITCHING: "Does it itch?" If so, ask: "How bad is the itch?"    - MILD: doesn't interfere with normal activities   - MODERATE-SEVERE: interferes with work, school, sleep, or other activities      Moderate 7. RECURRENT PROBLEM: "Have you had hives before?" If so, ask: "When was the last time?" and "What happened that time?"      Occurred before- faded away and came back 8. TRIGGERS: "Were you exposed to any new food, plant, cosmetic product or animal just before the hives began?"     Nothing that patient is aware of 9. OTHER SYMPTOMS: "Do you have any other symptoms?" (e.g., fever, tongue swelling, difficulty breathing, abdominal pain)     no 10. PREGNANCY: "Is there any chance you are pregnant?" "When was your last menstrual period?"       n/a  Protocols used: HIVES-A-AH

## 2017-04-25 ENCOUNTER — Encounter: Payer: Self-pay | Admitting: Unknown Physician Specialty

## 2017-04-25 ENCOUNTER — Ambulatory Visit (INDEPENDENT_AMBULATORY_CARE_PROVIDER_SITE_OTHER): Payer: Medicare HMO | Admitting: Unknown Physician Specialty

## 2017-04-25 VITALS — BP 128/83 | HR 79 | Temp 98.2°F | Wt 298.6 lb

## 2017-04-25 DIAGNOSIS — R05 Cough: Secondary | ICD-10-CM | POA: Diagnosis not present

## 2017-04-25 DIAGNOSIS — R053 Chronic cough: Secondary | ICD-10-CM

## 2017-04-25 DIAGNOSIS — L509 Urticaria, unspecified: Secondary | ICD-10-CM | POA: Diagnosis not present

## 2017-04-25 MED ORDER — TRIAMCINOLONE ACETONIDE 0.1 % EX CREA: 1.0000 "application " | TOPICAL_CREAM | Freq: Two times a day (BID) | CUTANEOUS | 0 refills | Status: DC

## 2017-04-25 MED ORDER — OLMESARTAN MEDOXOMIL 40 MG PO TABS: 40.0000 mg | ORAL_TABLET | Freq: Every day | ORAL | 1 refills | Status: AC

## 2017-04-25 NOTE — Progress Notes (Signed)
BP 128/83   Pulse 79   Temp 98.2 F (36.8 C) (Oral)   Wt 298 lb 9.6 oz (135.4 kg)   SpO2 97%   BMI 38.86 kg/m    Subjective:    Patient ID: Rickey Gottron., male    DOB: 1961-03-27, 56 y.o.   MRN: [id number]  HPI: Rickey Winebarger. is a 56 y.o. male  Chief Complaint  Patient presents with  . Rash    pt states that he has a rash on his chest that he has had off and on for a few months, States the rash burn and itches this time. Has tried OTC cream and benadryl with no relief  . Cough    pt states he has had a dry cough for a month, has tried multiple otc medications with no relief    Rash Pt with erythemetous/papular rash that itches.  States it has been there for 2 weeks.  Using Benadryl cream without help.    Dry cough Dry cough and has taken multiple OTC medications.  No help with any of those OTC meds.  Has been on Accupril for a while  Relevant past medical, surgical, family and social history reviewed and updated as indicated. Interim medical history since our last visit reviewed. Allergies and medications reviewed and updated.  Review of Systems  Per HPI unless specifically indicated above     Objective:    BP 128/83   Pulse 79   Temp 98.2 F (36.8 C) (Oral)   Wt 298 lb 9.6 oz (135.4 kg)   SpO2 97%   BMI 38.86 kg/m   Wt Readings from Last 3 Encounters:  04/25/17 298 lb 9.6 oz (135.4 kg)  03/09/17 (!) 302 lb (137 kg)  02/23/17 (!) 306 lb (138.8 kg)    Physical Exam  Constitutional: He is oriented to person, place, and time. He appears well-developed and well-nourished. No distress.  HENT:  Head: Normocephalic and atraumatic.  Eyes: Conjunctivae and lids are normal. Right eye exhibits no discharge. Left eye exhibits no discharge. No scleral icterus.  Neck: Normal range of motion. Neck supple. No JVD present. Carotid bruit is not present.  Cardiovascular: Normal rate, regular rhythm and normal heart sounds.  Pulmonary/Chest: Effort normal  and breath sounds normal. No respiratory distress.  Abdominal: Normal appearance. There is no splenomegaly or hepatomegaly.  Musculoskeletal: Normal range of motion.  Neurological: He is alert and oriented to person, place, and time.  Skin: Skin is warm, dry and intact. No pallor.  Papular rash upper chest  Psychiatric: He has a normal mood and affect. His behavior is normal. Judgment and thought content normal.    Results for orders placed or performed in visit on 02/23/17  CBC with Differential/Platelet  Result Value Ref Range   WBC 13.9 (H) 3.4 - 10.8 x10E3/uL   RBC 4.83 4.14 - 5.80 x10E6/uL   Hemoglobin 13.6 13.0 - 17.7 g/dL   Hematocrit 41.6 37.5 - 51.0 %   MCV 86 79 - 97 fL   MCH 28.2 26.6 - 33.0 pg   MCHC 32.7 31.5 - 35.7 g/dL   RDW 14.0 12.3 - 15.4 %   Platelets 327 150 - 379 x10E3/uL   Neutrophils 68 Not Estab. %   Lymphs 25 Not Estab. %   Monocytes 4 Not Estab. %   Eos 2 Not Estab. %   Basos 0 Not Estab. %   Neutrophils Absolute 9.4 (H) 1.4 - 7.0 x10E3/uL   Lymphocytes Absolute 3.5 (  H) 0.7 - 3.1 x10E3/uL   Monocytes Absolute 0.6 0.1 - 0.9 x10E3/uL   EOS (ABSOLUTE) 0.3 0.0 - 0.4 x10E3/uL   Basophils Absolute 0.0 0.0 - 0.2 x10E3/uL   Immature Granulocytes 1 Not Estab. %   Immature Grans (Abs) 0.1 0.0 - 0.1 x10E3/uL  Comprehensive metabolic panel  Result Value Ref Range   Glucose 248 (H) 65 - 99 mg/dL   BUN 11 6 - 24 mg/dL   Creatinine, Ser 1.20 0.76 - 1.27 mg/dL   GFR calc non Af Amer 68 >59 mL/min/1.73   GFR calc Af Amer 79 >59 mL/min/1.73   BUN/Creatinine Ratio 9 9 - 20   Sodium 141 134 - 144 mmol/L   Potassium 4.7 3.5 - 5.2 mmol/L   Chloride 102 96 - 106 mmol/L   CO2 23 20 - 29 mmol/L   Calcium 9.7 8.7 - 10.2 mg/dL   Total Protein 7.0 6.0 - 8.5 g/dL   Albumin 4.3 3.5 - 5.5 g/dL   Globulin, Total 2.7 1.5 - 4.5 g/dL   Albumin/Globulin Ratio 1.6 1.2 - 2.2   Bilirubin Total 0.4 0.0 - 1.2 mg/dL   Alkaline Phosphatase 91 39 - 117 IU/L   AST 17 0 - 40 IU/L    ALT 20 0 - 44 IU/L  Hgb A1c w/o eAG  Result Value Ref Range   Hgb A1c MFr Bld 8.3 (H) 4.8 - 5.6 %  Lipid Panel w/o Chol/HDL Ratio  Result Value Ref Range   Cholesterol, Total 113 100 - 199 mg/dL   Triglycerides 54 0 - 149 mg/dL   HDL 48 >39 mg/dL   VLDL Cholesterol Cal 11 5 - 40 mg/dL   LDL Calculated 54 0 - 99 mg/dL  PSA  Result Value Ref Range   Prostate Specific Ag, Serum 1.6 0.0 - 4.0 ng/mL  TSH  Result Value Ref Range   TSH 1.920 0.450 - 4.500 uIU/mL  UA/M w/rflx Culture, Routine  Result Value Ref Range   Specific Gravity, UA 1.020 1.005 - 1.030   pH, UA 5.5 5.0 - 7.5   Color, UA Yellow Yellow   Appearance Ur Clear Clear   Leukocytes, UA Negative Negative   Protein, UA Negative Negative/Trace   Glucose, UA 3+ (A) Negative   Ketones, UA Trace (A) Negative   Urobilinogen, Ur 1.0 0.2 - 1.0 mg/dL      Assessment & Plan:   Problem List Items Addressed This Visit    None    Visit Diagnoses    Urticaria    -  Primary   New problem.  Triamcinolone cream BID   Persistent dry cough       ? related to COPD as a long time smoker.  ? ACE.  Change to Olmesartan 40 mg.  Will need K level checked at next visit in 1 month. Add Incruse sample. 1 puff/d        Follow up plan: Return for next month with Dr. Wynetta Emery.

## 2017-04-27 ENCOUNTER — Ambulatory Visit: Payer: PRIVATE HEALTH INSURANCE | Admitting: Cardiovascular Disease

## 2017-05-02 ENCOUNTER — Telehealth: Payer: Self-pay | Admitting: Family Medicine

## 2017-05-02 MED ORDER — ALBUTEROL SULFATE HFA 108 (90 BASE) MCG/ACT IN AERS: 2.0000 | INHALATION_SPRAY | Freq: Four times a day (QID) | RESPIRATORY_TRACT | 2 refills | Status: DC | PRN

## 2017-05-02 NOTE — Telephone Encounter (Signed)
Routing to provider who gave patient the sample. According to OV note, patient was given a sample of Incruse.

## 2017-05-02 NOTE — Telephone Encounter (Signed)
Copied from [place] [id number]. Topic: Quick Communication - See Telephone Encounter >> May 02, 2017 10:18 AM Ivar Drape wrote: CRM for notification. See Telephone encounter for:  05/02/17. Patient received a sampler Inhaler on his last visit but he says it doesn't work.  So he will need another one.

## 2017-05-02 NOTE — Telephone Encounter (Signed)
Called and left patient a VM letting him know that Rickey Glover has sent in a prescription for an inhaler to his pharmacy. Asked for patient to call with any questions or concerns.

## 2017-05-03 ENCOUNTER — Telehealth: Payer: Self-pay | Admitting: Family Medicine

## 2017-05-03 NOTE — Telephone Encounter (Signed)
We did not order his MRI- that was ordered by his orthopedist. He told us that he cannot afford the MRI in September. We put in a referral to [facility] to see if there was anything that they could do to help with this. He needs to follow up with his surgeon regarding both his back and his MRI if he has not done that.   I am not sure what's going on with [facility]- but I did not order an MRI on his back- that was his Psychologist, sport and exercise. I'm sending this message to Curt Bears and Jonelle Sidle to see if they can do anything and to Keri to let him know that I did not order his MRI and that he needs to talk to his surgeon if he would like to get it scheduled.

## 2017-05-03 NOTE — Telephone Encounter (Unsigned)
Copied from [place] [id number]. Topic: Referral - Status >> May 02, 2017 10:22 AM Ivar Drape wrote: Reason for CRM:  Patient said he is still waiting for the information on his MRI for his back.  He expressed that it has been over a month since the practice said they would send him for an MRI.  >> May 02, 2017  4:16 PM Sandria Manly, [place] wrote: Dr. Wynetta Emery,   Do you know anything about a MRI for this patient? I do not have anything recently or within the last few months for patient.

## 2017-05-03 NOTE — Telephone Encounter (Signed)
At first patient said he hasn't seen an orthopedic doctor. Then I reminded patient that he has seen [facility].  They were going to do MRI, we were going to do [facility] to see if we couldn't help with payments.  Patient said he remembered now going to [facility] and they did say something about a pinched nerve in his back.   Patient asked who did he need to call, and I explained to patient [facility]. Patient asked about insurance and I explained to patient the ordering office will obtain authorization if needed. Patient understood and stated he was going to call [facility].

## 2017-05-12 DIAGNOSIS — R079 Chest pain, unspecified: Secondary | ICD-10-CM | POA: Insufficient documentation

## 2017-05-12 NOTE — Progress Notes (Deleted)
Cardiology Office Note  Date:  05/12/2017   ID:  Rickey Glover., DOB 1960/08/25, MRN [id number]  PCP:  Rickey Roys, DO   No chief complaint on file.   HPI:  Rickey Glover. 56 y.o. male w hx of HTN, HL, DM2, TIA  smoker . Diabetes mellitus type 2, uncomplicated (CMS-HCC)  accu ck 105  . Difficult airway 09/11/2013  . ED (erectile dysfunction)  . Hyperlipidemia, unspecified, unspecified  . Hypertension  . Lumbar radiculopathy 05/08/2014  . Obesity, unspecified  . Pancreatitis 2009  he had diabetes and then started with pancreatitis  . Sleep apnea  CPAP  . Spinal stenosis, lumbar region, without neurogenic claudication  . Stroke (CMS-HCC)  . TIA (transient ischemic attack) 05/30/14  . Type 2 diabetes mellitus, uncontrolled (CMS-HCC) 02/05/2012  12/25/13 HbA1c - 8.6%  . 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  lightheadedness.  Pt was able to pull over, and got out of his truck and walked around and "stretched" out which seemed to help a little bit and allowed him to keep driving. The pain continued for the past hour  continued throbbing in the chest. No leg pain or swelling.  Only does local driving, no periods of >3-4 hours driving or immobilization. No leg pain or swelling. No hemoptysis, no hx of DVT/PE, no malignancy.   Declined admission      right hip replacement surgery  In ER 03/07/17 for chest pain   Patient denies chest pain. He does have shortness of breath with walking. This has been going on for quite some time, symptoms mainly in the chest, nonradiating, mild intensity, occurs with walking, goes away with rest. He also has a lot of difficulty with walking because of the right hip issue. Next  Patient denies PND, orthopnea, edema. He is on the process of getting a CPAP for his OSA. No loss of  consciousness.    PMH:   has a past medical history of Chronic back pain, DDD (degenerative disc disease), lumbar, Diabetes mellitus without complication ([place]), Hip pain, Hyperlipidemia, Hypertension, Insomnia, Knee pain, left, LAP-BAND surgery status, Sciatica of right side, Sleep apnea, and Vitamin D deficiency.  PSH:    Past Surgical History:  Procedure Laterality Date  . COLONOSCOPY WITH PROPOFOL N/A 07/15/2015   Procedure: COLONOSCOPY WITH PROPOFOL;  Surgeon: Lucilla Lame, MD;  Location: [place];  Service: Endoscopy;  Laterality: N/A;  Diabetic - oral meds Sleep apnea  . JOINT REPLACEMENT Right 2016  . lap band surgery    . LUMBAR SPINE SURGERY  April 2017    Current Outpatient Medications  Medication Sig Dispense Refill  . albuterol (PROVENTIL HFA;VENTOLIN HFA) 108 (90 Base) MCG/ACT inhaler Inhale 2 puffs every 6 (six) hours as needed into the lungs for wheezing or shortness of breath. 1 Inhaler 2  . amLODipine (NORVASC) 10 MG tablet Take 1 tablet (10 mg total) by mouth daily. 90 tablet 1  . aspirin EC 81 MG tablet Take 81 mg by mouth daily.    Marland Kitchen atorvastatin (LIPITOR) 80 MG tablet Take 1 tablet (80 mg total) by mouth daily. 90 tablet 1  . clonazePAM (KLONOPIN) 0.5 MG tablet 1 tablet at bedtime as needed (try every other day at least) 30 tablet 0  . clopidogrel (PLAVIX) 75 MG tablet TK 1 T PO D  3  . clopidogrel (PLAVIX) 75 MG tablet TAKE 1 TABLET BY MOUTH EVERY DAY 90 tablet 0  . furosemide (LASIX) 40 MG tablet Take 1 tablet (40 mg total) by mouth daily. 30 tablet 3  . glimepiride (AMARYL) 4 MG tablet Take 1 tablet (4 mg total) by mouth 2 (two) times daily. 180 tablet 1  . metFORMIN (GLUCOPHAGE-XR) 500 MG 24 hr tablet Take 1 tablet (500 mg total) by mouth 2 (two) times daily with a meal. 360 tablet 1  . naproxen (NAPROSYN) 500 MG tablet Take 1 tablet (500 mg total) by mouth 2 (two) times daily with a meal. (Patient not taking: Reported on 03/09/2017) 180 tablet 1  .  olmesartan (BENICAR) 40 MG tablet Take 1 tablet (40 mg total) daily by mouth. 90 tablet 1  . spironolactone (ALDACTONE) 25 MG tablet TAKE 1 TABLET(25 MG) BY MOUTH DAILY 90 tablet 1  . triamcinolone cream (KENALOG) 0.1 % Apply 1 application 2 (two) times daily topically. 30 g 0  . Vitamin D, Ergocalciferol, (DRISDOL) 50000 units CAPS capsule Take 1 capsule (50,000 Units total) by mouth every 7 (seven) days. (Patient not taking: Reported on 02/23/2017) 12 capsule 0   No current facility-administered medications for this visit.      Allergies:   Percocet [oxycodone-acetaminophen]   Social History:  The patient  reports that he has been smoking cigarettes.  He has a 10.00 pack-year smoking history. he has never used smokeless tobacco. He reports that he does not drink alcohol or use drugs.   Family History:   family history includes Aneurysm in his mother; Stroke in his maternal grandmother and paternal grandmother.    Review of Systems: ROS   PHYSICAL EXAM: VS:  There were no vitals taken for this visit. , BMI There is no height or weight on file to calculate BMI. GEN: Well nourished, well developed, in no acute distress HEENT: normal Neck: no JVD, carotid bruits, or masses Cardiac: RRR; no murmurs, rubs, or gallops,no edema  Respiratory:  clear to auscultation bilaterally, normal work of breathing GI: soft, nontender, nondistended, + BS MS: no deformity or atrophy Skin: warm and dry, no rash Neuro:  Strength and sensation are intact Psych: euthymic mood, full affect    Recent Labs: 02/23/2017: ALT 20; BUN 11; Creatinine, Ser 1.20; Hemoglobin 13.6; Platelets 327; Potassium 4.7; Sodium 141; TSH 1.920    Lipid Panel Lab Results  Component Value Date   CHOL 113 02/23/2017   HDL 48 02/23/2017   LDLCALC 54 02/23/2017   TRIG 54 02/23/2017      Wt Readings from Last 3 Encounters:  04/25/17 298 lb 9.6 oz (135.4 kg)  03/09/17 (!) 302 lb (137 kg)  02/23/17 (!) 306 lb (138.8 kg)        ASSESSMENT AND PLAN:  No diagnosis found.   Disposition:   F/U  6 months  No orders of the defined types were placed in this encounter.    Signed, Esmond Plants, M.D., Ph.D. 05/12/2017  [place], [place]

## 2017-05-14 ENCOUNTER — Ambulatory Visit: Payer: PRIVATE HEALTH INSURANCE | Admitting: Cardiovascular Disease

## 2017-05-15 ENCOUNTER — Encounter: Payer: Self-pay | Admitting: Cardiovascular Disease

## 2017-05-21 ENCOUNTER — Encounter: Payer: Self-pay | Admitting: Family Medicine

## 2017-05-21 MED ORDER — FUROSEMIDE 40 MG PO TABS: 40.0000 mg | ORAL_TABLET | Freq: Every day | ORAL | 3 refills | Status: AC

## 2017-05-25 ENCOUNTER — Ambulatory Visit: Payer: 59 | Admitting: Family Medicine

## 2017-05-25 NOTE — Progress Notes (Deleted)
There were no vitals taken for this visit.   Subjective:    Patient ID: Rickey Gottron., male    DOB: 12-Dec-1960, 56 y.o.   MRN: [id number]  HPI: Rickey Kimmel. is a 56 y.o. male  No chief complaint on file.  DIABETES Hypoglycemic episodes:{Blank single:19197::"yes","no"} Polydipsia/polyuria: {Blank single:19197::"yes","no"} Visual disturbance: {Blank single:19197::"yes","no"} Chest pain: {Blank single:19197::"yes","no"} Paresthesias: {Blank single:19197::"yes","no"} Glucose Monitoring: {Blank single:19197::"yes","no"}  Accucheck frequency: {Blank single:19197::"Not Checking","Daily","BID","TID"}  Fasting glucose:  Post prandial:  Evening:  Before meals: Taking Insulin?: no Blood Pressure Monitoring: not checking Retinal Examination: Up to Date Foot Exam: Up to Date Diabetic Education: Completed Pneumovax: Up to Date Influenza: Up to Date Aspirin: yes   Relevant past medical, surgical, family and social history reviewed and updated as indicated. Interim medical history since our last visit reviewed. Allergies and medications reviewed and updated.  Review of Systems  Per HPI unless specifically indicated above     Objective:    There were no vitals taken for this visit.  Wt Readings from Last 3 Encounters:  04/25/17 298 lb 9.6 oz (135.4 kg)  03/09/17 (!) 302 lb (137 kg)  02/23/17 (!) 306 lb (138.8 kg)    Physical Exam  Results for orders placed or performed in visit on 02/23/17  CBC with Differential/Platelet  Result Value Ref Range   WBC 13.9 (H) 3.4 - 10.8 x10E3/uL   RBC 4.83 4.14 - 5.80 x10E6/uL   Hemoglobin 13.6 13.0 - 17.7 g/dL   Hematocrit 41.6 37.5 - 51.0 %   MCV 86 79 - 97 fL   MCH 28.2 26.6 - 33.0 pg   MCHC 32.7 31.5 - 35.7 g/dL   RDW 14.0 12.3 - 15.4 %   Platelets 327 150 - 379 x10E3/uL   Neutrophils 68 Not Estab. %   Lymphs 25 Not Estab. %   Monocytes 4 Not Estab. %   Eos 2 Not Estab. %   Basos 0 Not Estab. %   Neutrophils  Absolute 9.4 (H) 1.4 - 7.0 x10E3/uL   Lymphocytes Absolute 3.5 (H) 0.7 - 3.1 x10E3/uL   Monocytes Absolute 0.6 0.1 - 0.9 x10E3/uL   EOS (ABSOLUTE) 0.3 0.0 - 0.4 x10E3/uL   Basophils Absolute 0.0 0.0 - 0.2 x10E3/uL   Immature Granulocytes 1 Not Estab. %   Immature Grans (Abs) 0.1 0.0 - 0.1 x10E3/uL  Comprehensive metabolic panel  Result Value Ref Range   Glucose 248 (H) 65 - 99 mg/dL   BUN 11 6 - 24 mg/dL   Creatinine, Ser 1.20 0.76 - 1.27 mg/dL   GFR calc non Af Amer 68 >59 mL/min/1.73   GFR calc Af Amer 79 >59 mL/min/1.73   BUN/Creatinine Ratio 9 9 - 20   Sodium 141 134 - 144 mmol/L   Potassium 4.7 3.5 - 5.2 mmol/L   Chloride 102 96 - 106 mmol/L   CO2 23 20 - 29 mmol/L   Calcium 9.7 8.7 - 10.2 mg/dL   Total Protein 7.0 6.0 - 8.5 g/dL   Albumin 4.3 3.5 - 5.5 g/dL   Globulin, Total 2.7 1.5 - 4.5 g/dL   Albumin/Globulin Ratio 1.6 1.2 - 2.2   Bilirubin Total 0.4 0.0 - 1.2 mg/dL   Alkaline Phosphatase 91 39 - 117 IU/L   AST 17 0 - 40 IU/L   ALT 20 0 - 44 IU/L  Hgb A1c w/o eAG  Result Value Ref Range   Hgb A1c MFr Bld 8.3 (H) 4.8 - 5.6 %  Lipid Panel w/o Chol/HDL  Ratio  Result Value Ref Range   Cholesterol, Total 113 100 - 199 mg/dL   Triglycerides 54 0 - 149 mg/dL   HDL 48 >39 mg/dL   VLDL Cholesterol Cal 11 5 - 40 mg/dL   LDL Calculated 54 0 - 99 mg/dL  PSA  Result Value Ref Range   Prostate Specific Ag, Serum 1.6 0.0 - 4.0 ng/mL  TSH  Result Value Ref Range   TSH 1.920 0.450 - 4.500 uIU/mL  UA/M w/rflx Culture, Routine  Result Value Ref Range   Specific Gravity, UA 1.020 1.005 - 1.030   pH, UA 5.5 5.0 - 7.5   Color, UA Yellow Yellow   Appearance Ur Clear Clear   Leukocytes, UA Negative Negative   Protein, UA Negative Negative/Trace   Glucose, UA 3+ (A) Negative   Ketones, UA Trace (A) Negative   Urobilinogen, Ur 1.0 0.2 - 1.0 mg/dL      Assessment & Plan:   Problem List Items Addressed This Visit      Endocrine   Type 2 diabetes mellitus with hyperglycemia,  without long-term current use of insulin (HCC) - Primary       Follow up plan: No Follow-up on file.

## 2017-05-30 NOTE — Telephone Encounter (Signed)
Checking status of refill ,furosemide (LASIX) 40 MG tablet, patient states he is completely out. Please advise [phone]

## 2017-06-24 ENCOUNTER — Other Ambulatory Visit: Payer: Self-pay | Admitting: Family Medicine

## 2017-06-24 DIAGNOSIS — G453 Amaurosis fugax: Secondary | ICD-10-CM

## 2017-07-19 ENCOUNTER — Other Ambulatory Visit: Payer: Self-pay | Admitting: Family Medicine

## 2017-07-19 DIAGNOSIS — G453 Amaurosis fugax: Secondary | ICD-10-CM

## 2017-07-20 NOTE — Telephone Encounter (Signed)
plavix refill Last OV: 02/2017 Last Refill:03/19/17 [place], [place]

## 2017-08-04 ENCOUNTER — Other Ambulatory Visit: Payer: Self-pay | Admitting: Family Medicine

## 2017-08-06 ENCOUNTER — Other Ambulatory Visit: Payer: Self-pay | Admitting: Family Medicine

## 2017-08-06 DIAGNOSIS — E1165 Type 2 diabetes mellitus with hyperglycemia: Secondary | ICD-10-CM | POA: Diagnosis not present

## 2017-08-06 DIAGNOSIS — Z9989 Dependence on other enabling machines and devices: Secondary | ICD-10-CM | POA: Diagnosis not present

## 2017-08-06 DIAGNOSIS — I10 Essential (primary) hypertension: Secondary | ICD-10-CM | POA: Diagnosis not present

## 2017-08-06 DIAGNOSIS — E118 Type 2 diabetes mellitus with unspecified complications: Secondary | ICD-10-CM | POA: Diagnosis not present

## 2017-08-06 DIAGNOSIS — M5136 Other intervertebral disc degeneration, lumbar region: Secondary | ICD-10-CM | POA: Diagnosis not present

## 2017-08-06 DIAGNOSIS — G4733 Obstructive sleep apnea (adult) (pediatric): Secondary | ICD-10-CM | POA: Diagnosis not present

## 2017-08-06 DIAGNOSIS — I509 Heart failure, unspecified: Secondary | ICD-10-CM | POA: Diagnosis not present

## 2017-08-06 DIAGNOSIS — N521 Erectile dysfunction due to diseases classified elsewhere: Secondary | ICD-10-CM | POA: Diagnosis not present

## 2017-09-04 ENCOUNTER — Other Ambulatory Visit: Payer: Self-pay | Admitting: Family Medicine

## 2017-09-07 DIAGNOSIS — M48062 Spinal stenosis, lumbar region with neurogenic claudication: Secondary | ICD-10-CM | POA: Diagnosis not present

## 2017-09-07 DIAGNOSIS — Z9889 Other specified postprocedural states: Secondary | ICD-10-CM | POA: Diagnosis not present

## 2017-09-12 ENCOUNTER — Other Ambulatory Visit: Payer: Self-pay | Admitting: Family Medicine

## 2017-09-13 DIAGNOSIS — M47816 Spondylosis without myelopathy or radiculopathy, lumbar region: Secondary | ICD-10-CM | POA: Diagnosis not present

## 2017-09-13 DIAGNOSIS — M48062 Spinal stenosis, lumbar region with neurogenic claudication: Secondary | ICD-10-CM | POA: Diagnosis not present

## 2017-09-13 DIAGNOSIS — Z9889 Other specified postprocedural states: Secondary | ICD-10-CM | POA: Diagnosis not present

## 2017-09-13 DIAGNOSIS — M5136 Other intervertebral disc degeneration, lumbar region: Secondary | ICD-10-CM | POA: Diagnosis not present

## 2017-09-13 NOTE — Telephone Encounter (Signed)
Pt came in and filled out a records release and stated he was going to be going to [facility].

## 2017-09-13 NOTE — Telephone Encounter (Signed)
Amaryl refill request  LOV 02/23/17 with [facility].   No follow up appts noted.  [facility] [place], [place]

## 2017-09-25 DIAGNOSIS — M48062 Spinal stenosis, lumbar region with neurogenic claudication: Secondary | ICD-10-CM | POA: Diagnosis not present

## 2017-09-25 DIAGNOSIS — G4733 Obstructive sleep apnea (adult) (pediatric): Secondary | ICD-10-CM | POA: Diagnosis not present

## 2017-09-25 DIAGNOSIS — Z9989 Dependence on other enabling machines and devices: Secondary | ICD-10-CM | POA: Diagnosis not present

## 2017-09-25 DIAGNOSIS — Z9889 Other specified postprocedural states: Secondary | ICD-10-CM | POA: Diagnosis not present

## 2017-09-30 ENCOUNTER — Other Ambulatory Visit: Payer: Self-pay | Admitting: Family Medicine

## 2017-10-01 NOTE — Telephone Encounter (Signed)
Your patient.  Thanks 

## 2017-10-05 ENCOUNTER — Other Ambulatory Visit: Payer: Self-pay | Admitting: Family Medicine

## 2017-10-08 ENCOUNTER — Other Ambulatory Visit: Payer: Self-pay | Admitting: Family Medicine

## 2017-10-09 DIAGNOSIS — M5117 Intervertebral disc disorders with radiculopathy, lumbosacral region: Secondary | ICD-10-CM | POA: Diagnosis not present

## 2017-10-09 DIAGNOSIS — M4807 Spinal stenosis, lumbosacral region: Secondary | ICD-10-CM | POA: Diagnosis not present

## 2017-10-09 DIAGNOSIS — M48062 Spinal stenosis, lumbar region with neurogenic claudication: Secondary | ICD-10-CM | POA: Diagnosis not present

## 2017-10-15 DIAGNOSIS — E118 Type 2 diabetes mellitus with unspecified complications: Secondary | ICD-10-CM | POA: Diagnosis not present

## 2017-10-15 DIAGNOSIS — N521 Erectile dysfunction due to diseases classified elsewhere: Secondary | ICD-10-CM | POA: Diagnosis not present

## 2017-10-15 DIAGNOSIS — G4733 Obstructive sleep apnea (adult) (pediatric): Secondary | ICD-10-CM | POA: Diagnosis not present

## 2017-10-15 DIAGNOSIS — Z9989 Dependence on other enabling machines and devices: Secondary | ICD-10-CM | POA: Diagnosis not present

## 2017-10-15 DIAGNOSIS — E1165 Type 2 diabetes mellitus with hyperglycemia: Secondary | ICD-10-CM | POA: Diagnosis not present

## 2017-10-25 ENCOUNTER — Other Ambulatory Visit: Payer: Self-pay | Admitting: Unknown Physician Specialty

## 2017-10-26 DIAGNOSIS — G4733 Obstructive sleep apnea (adult) (pediatric): Secondary | ICD-10-CM | POA: Diagnosis not present

## 2017-11-05 DIAGNOSIS — I10 Essential (primary) hypertension: Secondary | ICD-10-CM | POA: Diagnosis not present

## 2017-11-05 DIAGNOSIS — E119 Type 2 diabetes mellitus without complications: Secondary | ICD-10-CM | POA: Diagnosis not present

## 2017-11-05 DIAGNOSIS — N521 Erectile dysfunction due to diseases classified elsewhere: Secondary | ICD-10-CM | POA: Diagnosis not present

## 2017-11-06 DIAGNOSIS — M48062 Spinal stenosis, lumbar region with neurogenic claudication: Secondary | ICD-10-CM | POA: Diagnosis not present

## 2017-11-06 DIAGNOSIS — M4807 Spinal stenosis, lumbosacral region: Secondary | ICD-10-CM | POA: Diagnosis not present

## 2017-11-06 DIAGNOSIS — M5117 Intervertebral disc disorders with radiculopathy, lumbosacral region: Secondary | ICD-10-CM | POA: Diagnosis not present

## 2017-11-24 ENCOUNTER — Other Ambulatory Visit: Payer: Self-pay | Admitting: Family Medicine

## 2017-11-26 DIAGNOSIS — G4733 Obstructive sleep apnea (adult) (pediatric): Secondary | ICD-10-CM | POA: Diagnosis not present

## 2017-11-26 NOTE — Telephone Encounter (Signed)
spironolactone refill Last Refill:09/05/17 # 90 No RF Last OV: 02/23/17 PCP: Rayford Halsted DO Pharmacy:[facility] [address]

## 2017-12-05 ENCOUNTER — Other Ambulatory Visit: Payer: Self-pay | Admitting: Family Medicine

## 2017-12-07 NOTE — Telephone Encounter (Signed)
Interface request for glimeperide 4mg .     LOV  02/23/17 with [facility]  Last refill 3/ 28/19   Pharmacy :  [place]

## 2017-12-26 DIAGNOSIS — G4733 Obstructive sleep apnea (adult) (pediatric): Secondary | ICD-10-CM | POA: Diagnosis not present

## 2018-01-03 DIAGNOSIS — M48062 Spinal stenosis, lumbar region with neurogenic claudication: Secondary | ICD-10-CM | POA: Diagnosis not present

## 2018-01-03 DIAGNOSIS — E11319 Type 2 diabetes mellitus with unspecified diabetic retinopathy without macular edema: Secondary | ICD-10-CM | POA: Diagnosis not present

## 2018-01-03 DIAGNOSIS — E119 Type 2 diabetes mellitus without complications: Secondary | ICD-10-CM | POA: Diagnosis not present

## 2018-01-03 DIAGNOSIS — H524 Presbyopia: Secondary | ICD-10-CM | POA: Diagnosis not present

## 2018-01-03 DIAGNOSIS — H52223 Regular astigmatism, bilateral: Secondary | ICD-10-CM | POA: Diagnosis not present

## 2018-01-03 DIAGNOSIS — H5213 Myopia, bilateral: Secondary | ICD-10-CM | POA: Diagnosis not present

## 2018-01-07 DIAGNOSIS — M48062 Spinal stenosis, lumbar region with neurogenic claudication: Secondary | ICD-10-CM | POA: Diagnosis not present

## 2018-01-07 DIAGNOSIS — N521 Erectile dysfunction due to diseases classified elsewhere: Secondary | ICD-10-CM | POA: Diagnosis not present

## 2018-01-07 DIAGNOSIS — E118 Type 2 diabetes mellitus with unspecified complications: Secondary | ICD-10-CM | POA: Diagnosis not present

## 2018-01-07 DIAGNOSIS — E1165 Type 2 diabetes mellitus with hyperglycemia: Secondary | ICD-10-CM | POA: Diagnosis not present

## 2018-01-07 DIAGNOSIS — G4733 Obstructive sleep apnea (adult) (pediatric): Secondary | ICD-10-CM | POA: Diagnosis not present

## 2018-01-26 DIAGNOSIS — G4733 Obstructive sleep apnea (adult) (pediatric): Secondary | ICD-10-CM | POA: Diagnosis not present

## 2018-02-03 ENCOUNTER — Other Ambulatory Visit: Payer: Self-pay | Admitting: Family Medicine

## 2018-02-03 DIAGNOSIS — G453 Amaurosis fugax: Secondary | ICD-10-CM

## 2018-02-05 ENCOUNTER — Other Ambulatory Visit: Payer: Self-pay | Admitting: Family Medicine

## 2018-02-05 ENCOUNTER — Telehealth: Payer: Self-pay | Admitting: Family Medicine

## 2018-02-05 DIAGNOSIS — G453 Amaurosis fugax: Secondary | ICD-10-CM

## 2018-02-05 NOTE — Telephone Encounter (Signed)
Needs appt to see Dr. Wynetta Emery. Please schedule.

## 2018-02-05 NOTE — Telephone Encounter (Signed)
Clopidogrel 75 mg refill Last Refill:07/27/17 # 90 Last OV: 04/25/17 PCP: Wynetta Emery Pharmacy:[facility]  (Patient may have been given 2 refills with 3 month supply)

## 2018-02-05 NOTE — Telephone Encounter (Signed)
Rx has been pended to office for provider reveiw

## 2018-03-01 ENCOUNTER — Other Ambulatory Visit: Payer: Self-pay | Admitting: Family Medicine

## 2018-03-04 NOTE — Telephone Encounter (Signed)
Pt is no longer a pt at this office.

## 2018-03-04 NOTE — Telephone Encounter (Signed)
Lasix refill Last Refill:05/21/17 # 30 with 3 refills Last OV: 04/25/17 PCP: Dr. Wynetta Emery Pharmacy:[facility] [address]

## 2018-03-05 ENCOUNTER — Other Ambulatory Visit: Payer: Self-pay | Admitting: Family Medicine

## 2018-06-06 ENCOUNTER — Other Ambulatory Visit: Payer: Self-pay | Admitting: Family Medicine

## 2018-06-07 NOTE — Telephone Encounter (Signed)
Requested medication (s) are due for refill today -overdue  Requested medication (s) are on the active medication list -yes  Future visit scheduled -no  Last refill: -09/05/17  Notes to clinic: Attempted to call patient to schedule appointment- left message to call for appointment. Sent for provider review  Requested Prescriptions  Pending Prescriptions Disp Refills   amLODipine (NORVASC) 10 MG tablet [Pharmacy Med Name: AMLODIPINE BESYLATE 10MG  TABLETS] 90 tablet 0    Sig: TAKE 1 TABLET(10 MG) BY MOUTH DAILY     Cardiovascular:  Calcium Channel Blockers Failed - 06/06/2018  7:22 PM      Failed - Valid encounter within last 6 months    Recent Outpatient Visits          1 year ago Urticaria   [facility] Kathrine Haddock, NP   1 year ago Chest pain, unspecified type   [facility], Lilia Argue, [place]   1 year ago Essential hypertension   [place], [place] P, DO   1 year ago Type 2 diabetes mellitus with hyperglycemia, without long-term current use of insulin ([place])   [place], Megan P, DO   1 year ago Acute bilateral low back pain without sciatica   [facility], Megan P, DO             Passed - Last BP in normal range    BP Readings from Last 1 Encounters:  04/25/17 128/83          Requested Prescriptions  Pending Prescriptions Disp Refills   amLODipine (NORVASC) 10 MG tablet [Pharmacy Med Name: AMLODIPINE BESYLATE 10MG  TABLETS] 90 tablet 0    Sig: TAKE 1 TABLET(10 MG) BY MOUTH DAILY     Cardiovascular:  Calcium Channel Blockers Failed - 06/06/2018  7:22 PM      Failed - Valid encounter within last 6 months    Recent Outpatient Visits          1 year ago [place] Kathrine Haddock, NP   1 year ago Chest pain, unspecified type   [facility], Lilia Argue, [place]   1 year ago Essential hypertension   [place], [place] P, DO   1 year ago Type 2 diabetes mellitus with hyperglycemia, without long-term current use of insulin [facility])   [place], Megan P, DO   1 year ago Acute bilateral low back pain without sciatica   [facility], Megan P, DO             Passed - Last BP in normal range    BP Readings from Last 1 Encounters:  04/25/17 128/83

## 2018-07-05 ENCOUNTER — Encounter: Payer: Self-pay | Admitting: Emergency Medicine

## 2018-07-05 ENCOUNTER — Ambulatory Visit
Admission: EM | Admit: 2018-07-05 | Discharge: 2018-07-05 | Disposition: A | Discharge status: Home / Self Care | Payer: BLUE CROSS/BLUE SHIELD | Attending: Family Medicine | Admitting: Family Medicine

## 2018-07-05 ENCOUNTER — Other Ambulatory Visit: Payer: Self-pay

## 2018-07-05 DIAGNOSIS — J111 Influenza due to unidentified influenza virus with other respiratory manifestations: Secondary | ICD-10-CM

## 2018-07-05 DIAGNOSIS — R05 Cough: Secondary | ICD-10-CM

## 2018-07-05 DIAGNOSIS — Z720 Tobacco use: Secondary | ICD-10-CM | POA: Diagnosis not present

## 2018-07-05 DIAGNOSIS — R69 Illness, unspecified: Secondary | ICD-10-CM

## 2018-07-05 MED ORDER — IPRATROPIUM BROMIDE 0.06 % NA SOLN: 2.0000 | Freq: Four times a day (QID) | NASAL | 0 refills | Status: DC | PRN

## 2018-07-05 MED ORDER — BENZONATATE 100 MG PO CAPS: 100.0000 mg | ORAL_CAPSULE | Freq: Three times a day (TID) | ORAL | 0 refills | Status: DC | PRN

## 2018-07-05 MED ORDER — TRAMADOL HCL 50 MG PO TABS: 50.0000 mg | ORAL_TABLET | Freq: Three times a day (TID) | ORAL | 0 refills | Status: DC | PRN

## 2018-07-05 NOTE — ED Provider Notes (Signed)
[facility]    CSN: [id number] Arrival date & time: 07/05/18  1650  History   Chief Complaint Chief Complaint  Patient presents with  . Cough  . Generalized Body Aches   HPI  58 year old male presents with complaints of flulike symptoms.  States that he has been sick since Monday.  He reports that he has been hot and cold.  He has had body aches and decreased appetite.  He has had a low-grade temperature.  Was recently 99.8.  He has had some cough.  Decreased appetite.  Feels poorly.  He has been taking over-the-counter medication without resolution.  No known exacerbating factors.  No other associated symptoms. No other complaints.   PMH, Surgical Hx, Family Hx, Social History reviewed and updated as below.  Past Medical History:  Diagnosis Date  . Chronic back pain   . DDD (degenerative disc disease), lumbar   . Diabetes mellitus without complication ([place])   . Hip pain   . Hyperlipidemia   . Hypertension   . Insomnia   . Knee pain, left   . LAP-BAND surgery status   . Sciatica of right side   . Sleep apnea    new DX - has not been for CPAP titration yet  . Vitamin D deficiency     Patient Active Problem List   Diagnosis Date Noted  . Chest pain 05/12/2017  . Sebaceous cyst 06/30/2016  . Multiple pulmonary nodules 01/13/2016  . Adrenal adenoma 01/13/2016  . CAD (coronary artery disease) 01/13/2016  . Controlled substance agreement signed 10/27/2015  . Hypertrophic toenail 10/08/2015  . Right hip pain 10/08/2015  . Special screening for malignant neoplasms, colon   . Chronic pain 06/30/2015  . Tobacco abuse 06/07/2015  . Amaurosis fugax of left eye 06/07/2015  . OSA (obstructive sleep apnea) 06/07/2015  . Insomnia 04/30/2015  . Leukocytosis 04/29/2015  . Type 2 diabetes mellitus with hyperglycemia, without long-term current use of insulin ([place]) 04/27/2015  . Right knee pain 04/27/2015  . DDD (degenerative disc disease), lumbar 04/27/2015  .  Essential hypertension 04/27/2015  . HLD (hyperlipidemia) 04/27/2015  . Obesity 04/27/2015  . Bodies, loose, joint, knee 02/26/2015  . Degenerative arthritis of hip 02/01/2015  . Hallux abductovalgus with bunions 06/16/2014  . Hook nail 06/16/2014  . Fungal infection of nail 06/16/2014  . Foot pain 06/16/2014  . Lacunar infarction ([place]) 05/25/2014  . Lumbar radiculopathy 05/08/2014  . Morbid (severe) obesity due to excess calories ([place]) 02/05/2014  . ED (erectile dysfunction) of organic origin 12/16/2013  . History of surgical procedure 10/06/2013  . Arthritis of knee, degenerative 08/12/2012  . Lumbar canal stenosis 06/04/2012  . Congestive heart failure ([place]) 02/12/2012  . Chronic pancreatitis ([place]) 02/05/2012    Past Surgical History:  Procedure Laterality Date  . COLONOSCOPY WITH PROPOFOL N/A 07/15/2015   Procedure: COLONOSCOPY WITH PROPOFOL;  Surgeon: Lucilla Lame, MD;  Location: [place];  Service: Endoscopy;  Laterality: N/A;  Diabetic - oral meds Sleep apnea  . JOINT REPLACEMENT Right 2016  . lap band surgery    . LUMBAR SPINE SURGERY  April 2017       Home Medications    Prior to Admission medications   Medication Sig Start Date End Date Taking? Authorizing Provider  amLODipine (NORVASC) 10 MG tablet TAKE 1 TABLET(10 MG) BY MOUTH DAILY 09/05/17  Yes Johnson, Megan P, DO  aspirin EC 81 MG tablet Take 81 mg by mouth daily.   Yes [provider]  atorvastatin (LIPITOR) 80 MG tablet TAKE 1 TABLET(80 MG) BY MOUTH DAILY 08/06/17  Yes Johnson, Megan P, DO  clonazePAM (KLONOPIN) 0.5 MG tablet 1 tablet at bedtime as needed (try every other day at least) 03/09/17  Yes Johnson, Megan P, DO  clopidogrel (PLAVIX) 75 MG tablet TAKE 1 TABLET BY MOUTH EVERY DAY 02/05/18  Yes Terrilee Croak, Adriana M, PA-C  furosemide (LASIX) 40 MG tablet Take 1 tablet (40 mg total) by mouth daily. 05/21/17  Yes Johnson, Megan P, DO  glimepiride (AMARYL) 4 MG tablet TAKE 1 TABLET(4 MG) BY  MOUTH TWICE DAILY 12/07/17  Yes Johnson, Megan P, DO  metFORMIN (GLUCOPHAGE-XR) 500 MG 24 hr tablet Take 1 tablet (500 mg total) by mouth 2 (two) times daily with a meal. 02/23/17  Yes Johnson, Megan P, DO  olmesartan (BENICAR) 40 MG tablet Take 1 tablet (40 mg total) daily by mouth. 04/25/17  Yes Kathrine Haddock, NP  spironolactone (ALDACTONE) 25 MG tablet TAKE 1 TABLET(25 MG) BY MOUTH DAILY 03/06/18  Yes Johnson, Megan P, DO  albuterol (PROVENTIL HFA;VENTOLIN HFA) 108 (90 Base) MCG/ACT inhaler Inhale 2 puffs every 6 (six) hours as needed into the lungs for wheezing or shortness of breath. 05/02/17   Kathrine Haddock, NP  benzonatate (TESSALON) 100 MG capsule Take 1 capsule (100 mg total) by mouth 3 (three) times daily as needed. 07/05/18   Thersa Salt G, DO  ipratropium (ATROVENT) 0.06 % nasal spray Place 2 sprays into both nostrils 4 (four) times daily as needed for rhinitis. 07/05/18   Coral Spikes, DO  traMADol (ULTRAM) 50 MG tablet Take 1 tablet (50 mg total) by mouth every 8 (eight) hours as needed. 07/05/18   Coral Spikes, DO  triamcinolone cream (KENALOG) 0.1 % Apply 1 application 2 (two) times daily topically. 04/25/17   Kathrine Haddock, NP  TRULICITY 1.5 DD/2.2GU [facility]  06/23/18   [provider]    Family History Family History  Problem Relation Age of Onset  . Aneurysm Mother        Brain  . Stroke Maternal Grandmother   . Stroke Paternal Grandmother     Social History Social History   Tobacco Use  . Smoking status: Current Every Day Smoker    Packs/day: 0.50    Years: 20.00    Pack years: 10.00    Types: Cigarettes  . Smokeless tobacco: Never Used  Substance Use Topics  . Alcohol use: No    Alcohol/week: 0.0 standard drinks  . Drug use: No     Allergies   Percocet [oxycodone-acetaminophen]   Review of Systems Review of Systems Per HPI  Physical Exam Triage Vital Signs ED Triage Vitals  Enc Vitals Group     BP 07/05/18 1716 93/69     Pulse Rate 07/05/18 1716  90     Resp 07/05/18 1716 16     Temp 07/05/18 1716 98.2 F (36.8 C)     Temp Source 07/05/18 1716 Oral     SpO2 07/05/18 1716 98 %     Weight 07/05/18 1714 290 lb (131.5 kg)     Height 07/05/18 1714 6\' 1"  (1.854 m)     Head Circumference --      Peak Flow --      Pain Score 07/05/18 1713 9     Pain Loc --      Pain Edu? --      Excl. in [place]? --    Updated Vital Signs BP 93/69 (BP Location: Left  Arm)   Pulse 90   Temp 98.2 F (36.8 C) (Oral)   Resp 16   Ht 6\' 1"  (1.854 m)   Wt 131.5 kg   SpO2 98%   BMI 38.26 kg/m   Visual Acuity Right Eye Distance:   Left Eye Distance:   Bilateral Distance:    Right Eye Near:   Left Eye Near:    Bilateral Near:     Physical Exam Vitals signs and nursing note reviewed.  Constitutional:      General: He is not in acute distress. HENT:     Head: Normocephalic and atraumatic.     Right Ear: Tympanic membrane normal.     Left Ear: Tympanic membrane normal.     Nose: Nose normal.     Mouth/Throat:     Pharynx: Oropharynx is clear. No posterior oropharyngeal erythema.  Eyes:     General:        Right eye: No discharge.        Left eye: No discharge.     Conjunctiva/sclera: Conjunctivae normal.  Cardiovascular:     Rate and Rhythm: Normal rate and regular rhythm.  Pulmonary:     Effort: Pulmonary effort is normal.     Breath sounds: No wheezing, rhonchi or rales.  Neurological:     Mental Status: He is alert.  Psychiatric:        Mood and Affect: Mood normal.        Behavior: Behavior normal.    UC Treatments / Results  Labs (all labs ordered are listed, but only abnormal results are displayed) Labs Reviewed - No data to display  EKG None  Radiology No results found.  Procedures Procedures (including critical care time)  Medications Ordered in UC Medications - No data to display  Initial Impression / Assessment and Plan / UC Course  I have reviewed the triage vital signs and the nursing notes.  Pertinent labs  & imaging results that were available during my care of the patient were reviewed by me and considered in my medical decision making (see chart for details).    58 year old male presents with an influenza-like illness.  Patient is out of the window for flu treatment.  Therefore no flu testing was done today.  Treating symptomatically with Tessalon Perles, Atrovent nasal spray.  Tramadol for moderate to severe pain.  Supportive care.  Final Clinical Impressions(s) / UC Diagnoses   Final diagnoses:  Influenza-like illness     Discharge Instructions     Rest. Fluids.  Medications as needed.  Take care  Dr. Lacinda Axon    ED Prescriptions    Medication Sig Dispense Auth. Provider   benzonatate (TESSALON) 100 MG capsule Take 1 capsule (100 mg total) by mouth 3 (three) times daily as needed. 30 capsule Salim Forero G, DO   traMADol (ULTRAM) 50 MG tablet Take 1 tablet (50 mg total) by mouth every 8 (eight) hours as needed. 10 tablet Myishia Kasik G, DO   ipratropium (ATROVENT) 0.06 % nasal spray Place 2 sprays into both nostrils 4 (four) times daily as needed for rhinitis. 15 mL Coral Spikes, DO     Controlled Substance Prescriptions [place] Controlled Substance Registry consulted? Not Applicable   Coral Spikes, DO 07/05/18 1827

## 2018-07-05 NOTE — ED Triage Notes (Signed)
Patient c/o cough, congestion, bodyaches and low grade fever that started Monday night.

## 2018-07-05 NOTE — Discharge Instructions (Signed)
Rest. Fluids.  Medications as needed.  Take care  Dr. Lacinda Axon

## 2022-06-04 ENCOUNTER — Encounter: Payer: Self-pay | Admitting: Emergency Medicine

## 2022-06-04 ENCOUNTER — Emergency Department: Payer: Medicare Other

## 2022-06-04 ENCOUNTER — Other Ambulatory Visit: Payer: Self-pay

## 2022-06-04 ENCOUNTER — Inpatient Hospital Stay: Payer: Medicare Other

## 2022-06-04 ENCOUNTER — Inpatient Hospital Stay
Admission: EM | Admit: 2022-06-04 | Discharge: 2022-06-06 | DRG: 561 | Disposition: A | Discharge status: Other Acute Inpatient Hospital | Payer: Medicare Other | Attending: Internal Medicine | Admitting: Internal Medicine

## 2022-06-04 DIAGNOSIS — G8929 Other chronic pain: Secondary | ICD-10-CM | POA: Diagnosis present

## 2022-06-04 DIAGNOSIS — Z9884 Bariatric surgery status: Secondary | ICD-10-CM

## 2022-06-04 DIAGNOSIS — M5431 Sciatica, right side: Secondary | ICD-10-CM | POA: Diagnosis present

## 2022-06-04 DIAGNOSIS — Z8719 Personal history of other diseases of the digestive system: Secondary | ICD-10-CM | POA: Diagnosis not present

## 2022-06-04 DIAGNOSIS — L089 Local infection of the skin and subcutaneous tissue, unspecified: Secondary | ICD-10-CM | POA: Diagnosis not present

## 2022-06-04 DIAGNOSIS — G4733 Obstructive sleep apnea (adult) (pediatric): Secondary | ICD-10-CM | POA: Diagnosis not present

## 2022-06-04 DIAGNOSIS — T8469XA Infection and inflammatory reaction due to internal fixation device of other site, initial encounter: Secondary | ICD-10-CM | POA: Diagnosis not present

## 2022-06-04 DIAGNOSIS — Z823 Family history of stroke: Secondary | ICD-10-CM

## 2022-06-04 DIAGNOSIS — E785 Hyperlipidemia, unspecified: Secondary | ICD-10-CM | POA: Diagnosis present

## 2022-06-04 DIAGNOSIS — Z7902 Long term (current) use of antithrombotics/antiplatelets: Secondary | ICD-10-CM | POA: Diagnosis not present

## 2022-06-04 DIAGNOSIS — M869 Osteomyelitis, unspecified: Principal | ICD-10-CM

## 2022-06-04 DIAGNOSIS — F17210 Nicotine dependence, cigarettes, uncomplicated: Secondary | ICD-10-CM | POA: Diagnosis present

## 2022-06-04 DIAGNOSIS — I2510 Atherosclerotic heart disease of native coronary artery without angina pectoris: Secondary | ICD-10-CM | POA: Diagnosis present

## 2022-06-04 DIAGNOSIS — G4700 Insomnia, unspecified: Secondary | ICD-10-CM | POA: Diagnosis present

## 2022-06-04 DIAGNOSIS — Y792 Prosthetic and other implants, materials and accessory orthopedic devices associated with adverse incidents: Secondary | ICD-10-CM | POA: Diagnosis present

## 2022-06-04 DIAGNOSIS — I10 Essential (primary) hypertension: Secondary | ICD-10-CM | POA: Diagnosis not present

## 2022-06-04 DIAGNOSIS — E1165 Type 2 diabetes mellitus with hyperglycemia: Secondary | ICD-10-CM | POA: Diagnosis present

## 2022-06-04 DIAGNOSIS — Z7982 Long term (current) use of aspirin: Secondary | ICD-10-CM

## 2022-06-04 DIAGNOSIS — Z7984 Long term (current) use of oral hypoglycemic drugs: Secondary | ICD-10-CM

## 2022-06-04 DIAGNOSIS — M79671 Pain in right foot: Secondary | ICD-10-CM | POA: Diagnosis not present

## 2022-06-04 DIAGNOSIS — R7881 Bacteremia: Secondary | ICD-10-CM | POA: Diagnosis not present

## 2022-06-04 DIAGNOSIS — Z885 Allergy status to narcotic agent status: Secondary | ICD-10-CM | POA: Diagnosis not present

## 2022-06-04 DIAGNOSIS — Z720 Tobacco use: Secondary | ICD-10-CM | POA: Diagnosis not present

## 2022-06-04 DIAGNOSIS — D72829 Elevated white blood cell count, unspecified: Secondary | ICD-10-CM | POA: Diagnosis present

## 2022-06-04 DIAGNOSIS — Z79891 Long term (current) use of opiate analgesic: Secondary | ICD-10-CM

## 2022-06-04 DIAGNOSIS — F5101 Primary insomnia: Secondary | ICD-10-CM | POA: Diagnosis not present

## 2022-06-04 DIAGNOSIS — E876 Hypokalemia: Secondary | ICD-10-CM | POA: Insufficient documentation

## 2022-06-04 DIAGNOSIS — E782 Mixed hyperlipidemia: Secondary | ICD-10-CM | POA: Diagnosis not present

## 2022-06-04 DIAGNOSIS — M5416 Radiculopathy, lumbar region: Secondary | ICD-10-CM | POA: Diagnosis present

## 2022-06-04 DIAGNOSIS — Z6835 Body mass index (BMI) 35.0-35.9, adult: Secondary | ICD-10-CM | POA: Diagnosis not present

## 2022-06-04 DIAGNOSIS — Z8673 Personal history of transient ischemic attack (TIA), and cerebral infarction without residual deficits: Secondary | ICD-10-CM

## 2022-06-04 DIAGNOSIS — Z7985 Long-term (current) use of injectable non-insulin antidiabetic drugs: Secondary | ICD-10-CM

## 2022-06-04 DIAGNOSIS — B9561 Methicillin susceptible Staphylococcus aureus infection as the cause of diseases classified elsewhere: Secondary | ICD-10-CM | POA: Diagnosis not present

## 2022-06-04 DIAGNOSIS — Z79899 Other long term (current) drug therapy: Secondary | ICD-10-CM

## 2022-06-04 LAB — CBC WITH DIFFERENTIAL/PLATELET
Abs Immature Granulocytes: 0.21 10*3/uL — ABNORMAL HIGH (ref 0.00–0.07)
Basophils Absolute: 0 10*3/uL (ref 0.0–0.1)
Basophils Relative: 0 %
Eosinophils Absolute: 0 10*3/uL (ref 0.0–0.5)
Eosinophils Relative: 0 %
HCT: 33.6 % — ABNORMAL LOW (ref 39.0–52.0)
Hemoglobin: 10.1 g/dL — ABNORMAL LOW (ref 13.0–17.0)
Immature Granulocytes: 1 %
Lymphocytes Relative: 4 %
Lymphs Abs: 1.2 10*3/uL (ref 0.7–4.0)
MCH: 26 pg (ref 26.0–34.0)
MCHC: 30.1 g/dL (ref 30.0–36.0)
MCV: 86.6 fL (ref 80.0–100.0)
Monocytes Absolute: 1.8 10*3/uL — ABNORMAL HIGH (ref 0.1–1.0)
Monocytes Relative: 6 %
Neutro Abs: 25 10*3/uL — ABNORMAL HIGH (ref 1.7–7.7)
Neutrophils Relative %: 89 %
Platelets: 372 10*3/uL (ref 150–400)
RBC: 3.88 MIL/uL — ABNORMAL LOW (ref 4.22–5.81)
RDW: 15.4 % (ref 11.5–15.5)
Smear Review: NORMAL
WBC: 28.3 10*3/uL — ABNORMAL HIGH (ref 4.0–10.5)
nRBC: 0 % (ref 0.0–0.2)

## 2022-06-04 LAB — BASIC METABOLIC PANEL
Anion gap: 12 (ref 5–15)
BUN: 11 mg/dL (ref 6–20)
CO2: 21 mmol/L — ABNORMAL LOW (ref 22–32)
Calcium: 8.3 mg/dL — ABNORMAL LOW (ref 8.9–10.3)
Chloride: 104 mmol/L (ref 98–111)
Creatinine, Ser: 1.14 mg/dL (ref 0.61–1.24)
GFR, Estimated: >60 mL/min (ref >60)
Glucose, Bld: 200 mg/dL — ABNORMAL HIGH (ref 70–99)
Potassium: 3.1 mmol/L — ABNORMAL LOW (ref 3.5–5.1)
Sodium: 137 mmol/L (ref 135–145)

## 2022-06-04 LAB — URINALYSIS, ROUTINE W REFLEX MICROSCOPIC
Bacteria, UA: NONE SEEN
Bilirubin Urine: NEGATIVE
Glucose, UA: NEGATIVE mg/dL
Hgb urine dipstick: NEGATIVE
Ketones, ur: NEGATIVE mg/dL
Leukocytes,Ua: NEGATIVE
Nitrite: NEGATIVE
Protein, ur: >=300 mg/dL — AB
Specific Gravity, Urine: >1.046 — ABNORMAL HIGH (ref 1.005–1.030)
Squamous Epithelial / HPF: NONE SEEN (ref 0–5)
pH: 5 (ref 5.0–8.0)

## 2022-06-04 LAB — CBG MONITORING, ED
Glucose-Capillary: 240 mg/dL — ABNORMAL HIGH (ref 70–99)
Glucose-Capillary: 155 mg/dL — ABNORMAL HIGH (ref 70–99)

## 2022-06-04 LAB — LACTIC ACID, PLASMA
Lactic Acid, Venous: 1.6 mmol/L (ref 0.5–1.9)
Lactic Acid, Venous: 1.5 mmol/L (ref 0.5–1.9)

## 2022-06-04 MED ORDER — MELATONIN 5 MG PO TABS: 5.0000 mg | ORAL_TABLET | Freq: Every evening | ORAL | Status: DC | PRN

## 2022-06-04 MED ORDER — VANCOMYCIN HCL 2000 MG/400ML IV SOLN
2000.0000 mg | Freq: Once | INTRAVENOUS | Status: AC
  Administered 2022-06-04: 2000 mg via INTRAVENOUS
  Filled 2022-06-04: qty 400

## 2022-06-04 MED ORDER — HEPARIN SODIUM (PORCINE) 5000 UNIT/ML IJ SOLN: 5000.0000 [IU] | Freq: Three times a day (TID) | INTRAMUSCULAR | Status: DC

## 2022-06-04 MED ORDER — IOHEXOL 300 MG/ML  SOLN
100.0000 mL | Freq: Once | INTRAMUSCULAR | Status: AC | PRN
  Administered 2022-06-04: 100 mL via INTRAVENOUS

## 2022-06-04 MED ORDER — ONDANSETRON HCL 4 MG/2ML IJ SOLN
4.0000 mg | Freq: Once | INTRAMUSCULAR | Status: DC
  Filled 2022-06-04: qty 2

## 2022-06-04 MED ORDER — LORAZEPAM 2 MG/ML IJ SOLN: 0.5000 mg | Freq: Four times a day (QID) | INTRAMUSCULAR | Status: DC | PRN

## 2022-06-04 MED ORDER — ACETAMINOPHEN 325 MG PO TABS
650.0000 mg | ORAL_TABLET | Freq: Four times a day (QID) | ORAL | Status: DC | PRN
  Administered 2022-06-06 (×2): 650 mg via ORAL
  Filled 2022-06-04 (×2): qty 2

## 2022-06-04 MED ORDER — ONDANSETRON HCL 4 MG PO TABS: 4.0000 mg | ORAL_TABLET | Freq: Four times a day (QID) | ORAL | Status: DC | PRN

## 2022-06-04 MED ORDER — TRAMADOL HCL 50 MG PO TABS: 50.0000 mg | ORAL_TABLET | Freq: Three times a day (TID) | ORAL | Status: DC | PRN

## 2022-06-04 MED ORDER — AMLODIPINE BESYLATE 5 MG PO TABS
5.0000 mg | ORAL_TABLET | Freq: Every day | ORAL | Status: DC
  Administered 2022-06-04 – 2022-06-06 (×3): 5 mg via ORAL
  Filled 2022-06-04 (×3): qty 1

## 2022-06-04 MED ORDER — SENNOSIDES-DOCUSATE SODIUM 8.6-50 MG PO TABS: 1.0000 | ORAL_TABLET | Freq: Every evening | ORAL | Status: DC | PRN

## 2022-06-04 MED ORDER — SODIUM CHLORIDE 0.9 % IV SOLN
2.0000 g | Freq: Three times a day (TID) | INTRAVENOUS | Status: DC
  Administered 2022-06-04 – 2022-06-06 (×6): 2 g via INTRAVENOUS
  Filled 2022-06-04 (×6): qty 12.5

## 2022-06-04 MED ORDER — GADOBUTROL 1 MMOL/ML IV SOLN
10.0000 mL | Freq: Once | INTRAVENOUS | Status: AC | PRN
  Administered 2022-06-04: 10 mL via INTRAVENOUS

## 2022-06-04 MED ORDER — SODIUM CHLORIDE 0.9 % IV SOLN: Freq: Once | INTRAVENOUS | Status: AC

## 2022-06-04 MED ORDER — INSULIN ASPART 100 UNIT/ML IJ SOLN
0.0000 [IU] | Freq: Three times a day (TID) | INTRAMUSCULAR | Status: DC
  Administered 2022-06-04: 5 [IU] via SUBCUTANEOUS
  Administered 2022-06-05 (×3): 3 [IU] via SUBCUTANEOUS
  Administered 2022-06-06: 5 [IU] via SUBCUTANEOUS
  Administered 2022-06-06: 3 [IU] via SUBCUTANEOUS
  Administered 2022-06-06: 8 [IU] via SUBCUTANEOUS
  Filled 2022-06-04 (×7): qty 1

## 2022-06-04 MED ORDER — ATORVASTATIN CALCIUM 20 MG PO TABS
80.0000 mg | ORAL_TABLET | Freq: Every day | ORAL | Status: DC
  Administered 2022-06-04 – 2022-06-05 (×2): 80 mg via ORAL
  Filled 2022-06-04 (×2): qty 4

## 2022-06-04 MED ORDER — FENTANYL CITRATE PF 50 MCG/ML IJ SOSY
100.0000 ug | PREFILLED_SYRINGE | Freq: Once | INTRAMUSCULAR | Status: AC
  Administered 2022-06-04: 100 ug via INTRAVENOUS
  Filled 2022-06-04: qty 2

## 2022-06-04 MED ORDER — IRBESARTAN 150 MG PO TABS
150.0000 mg | ORAL_TABLET | Freq: Every day | ORAL | Status: DC
  Administered 2022-06-04 – 2022-06-06 (×3): 150 mg via ORAL
  Filled 2022-06-04 (×3): qty 1

## 2022-06-04 MED ORDER — FENTANYL CITRATE PF 50 MCG/ML IJ SOSY
50.0000 ug | PREFILLED_SYRINGE | INTRAMUSCULAR | Status: DC | PRN
  Administered 2022-06-04 – 2022-06-05 (×4): 50 ug via INTRAVENOUS
  Filled 2022-06-04 (×4): qty 1

## 2022-06-04 MED ORDER — DIPHENHYDRAMINE HCL 50 MG/ML IJ SOLN: 12.5000 mg | Freq: Four times a day (QID) | INTRAMUSCULAR | Status: DC | PRN

## 2022-06-04 MED ORDER — PIPERACILLIN-TAZOBACTAM 3.375 G IVPB 30 MIN
3.3750 g | Freq: Once | INTRAVENOUS | Status: AC
  Administered 2022-06-04: 3.375 g via INTRAVENOUS
  Filled 2022-06-04: qty 50

## 2022-06-04 MED ORDER — NICOTINE 21 MG/24HR TD PT24: 21.0000 mg | MEDICATED_PATCH | Freq: Every day | TRANSDERMAL | Status: DC | PRN

## 2022-06-04 MED ORDER — POTASSIUM CHLORIDE CRYS ER 20 MEQ PO TBCR
40.0000 meq | EXTENDED_RELEASE_TABLET | Freq: Once | ORAL | Status: AC
  Administered 2022-06-04: 40 meq via ORAL
  Filled 2022-06-04: qty 2

## 2022-06-04 MED ORDER — HYDROMORPHONE HCL 1 MG/ML IJ SOLN
1.0000 mg | Freq: Once | INTRAMUSCULAR | Status: DC
  Filled 2022-06-04: qty 1

## 2022-06-04 MED ORDER — HEPARIN SODIUM (PORCINE) 5000 UNIT/ML IJ SOLN
5000.0000 [IU] | Freq: Three times a day (TID) | INTRAMUSCULAR | Status: AC
  Administered 2022-06-04: 5000 [IU] via SUBCUTANEOUS
  Filled 2022-06-04: qty 1

## 2022-06-04 MED ORDER — INSULIN ASPART 100 UNIT/ML IJ SOLN
0.0000 [IU] | Freq: Every day | INTRAMUSCULAR | Status: DC
  Administered 2022-06-05: 5 [IU] via SUBCUTANEOUS
  Filled 2022-06-04: qty 1

## 2022-06-04 MED ORDER — ONDANSETRON HCL 4 MG/2ML IJ SOLN: 4.0000 mg | Freq: Four times a day (QID) | INTRAMUSCULAR | Status: DC | PRN

## 2022-06-04 MED ORDER — FERROUS SULFATE 325 (65 FE) MG PO TABS
325.0000 mg | ORAL_TABLET | Freq: Every day | ORAL | Status: DC
  Administered 2022-06-05 – 2022-06-06 (×2): 325 mg via ORAL
  Filled 2022-06-04 (×2): qty 1

## 2022-06-04 MED ORDER — MORPHINE SULFATE (PF) 4 MG/ML IV SOLN
4.0000 mg | INTRAVENOUS | Status: DC | PRN
  Administered 2022-06-05 (×2): 4 mg via INTRAVENOUS
  Filled 2022-06-04 (×2): qty 1

## 2022-06-04 MED ORDER — VANCOMYCIN HCL IN DEXTROSE 1-5 GM/200ML-% IV SOLN
1000.0000 mg | Freq: Two times a day (BID) | INTRAVENOUS | Status: DC
  Administered 2022-06-05 – 2022-06-06 (×3): 1000 mg via INTRAVENOUS
  Filled 2022-06-04 (×3): qty 200

## 2022-06-04 MED ORDER — SPIRONOLACTONE 25 MG PO TABS
25.0000 mg | ORAL_TABLET | Freq: Every day | ORAL | Status: DC
  Administered 2022-06-05 – 2022-06-06 (×2): 25 mg via ORAL
  Filled 2022-06-04 (×2): qty 1

## 2022-06-04 MED ORDER — CLONAZEPAM 0.5 MG PO TABS: 0.5000 mg | ORAL_TABLET | Freq: Every evening | ORAL | Status: DC | PRN

## 2022-06-04 MED ORDER — ACETAMINOPHEN 650 MG RE SUPP: 650.0000 mg | Freq: Four times a day (QID) | RECTAL | Status: DC | PRN

## 2022-06-04 NOTE — Hospital Course (Signed)
[name] is a 61 year old male with history of morbid obesity, hypertension, hyperlipidemia, non-insulin-dependent diabetes mellitus, history of spinal laminectomy in August 2023 with recent history of vertebral osteomyelitis and discitis with MSSA bacteremia in August 2023, completed appropriate antibiotic therapy treatment with [facility] infectious disease department, negative repeat blood cultures and 02/20/2022, CAD on aspirin and Plavix, CVA, history of right ankle and tibia fixation hardware who presents to the emergency department via EMS for chief concerns of right foot pain and swelling.  Initial vitals in the emergency department showed temperature of 98.6, respiration rate of 20, heart rate of 107, blood pressure 148/66, SpO2 of 98% on room air.  Serum sodium is 137, potassium 3.1, chloride 104, bicarb 21, BUN of 11, serum creatinine of 1.14, EGFR greater than 60, nonfasting blood glucose 200, WBC 28.3, hemoglobin 10.1, platelets of 372.  Lactic acid is 1.6.  EDP discussed with podiatrist, Dr. Rhea Belton who recommends discontinuing MRI due to the hardware in his right foot and instead order CT the foot with and without contrast.  ED treatment: Fentanyl 100 mcg IV one-time dose, ondansetron Zosyn, vancomycin per pharmacy.

## 2022-06-04 NOTE — Assessment & Plan Note (Addendum)
-   As needed nicotine patch ordered for nicotine craving - Patient is not ready to quit smoking

## 2022-06-04 NOTE — Assessment & Plan Note (Signed)
-   CPAP nightly ordered 

## 2022-06-04 NOTE — Assessment & Plan Note (Signed)
-   This meets criteria for morbid obesity based on the presence of 1 or more chronic comorbidities. Patient has non-insulin-dependent type hypertension with BMI 35.6. This complicates overall care and prognosis.

## 2022-06-04 NOTE — Consult Note (Signed)
Pharmacy Antibiotic Note  Rickey Glover. is a 61 y.o. male admitted on 06/04/2022 with  osteomyelitis .  Pharmacy has been consulted for Vancomycin and cefepime dosing.  Plan: Cefepime 2gm q8hrs Vancomycin 2000 mg IVPB x 1 dose in ED, then Vancomycin 1000 mg IV Q 12 hrs.        Goal AUC 400-550.      Expected AUC: 467.5      SCr used: 1.14, Cmax 28.8, Cmin 13.3   Height: '6\' 1"'$  (185.4 cm) Weight: 122.5 kg (270 lb) IBW/kg (Calculated) : 79.9  Temp (24hrs), Avg:98.6 F (37 C), Min:98.6 F (37 C), Max:98.6 F (37 C)  Recent Labs  Lab 06/04/22 1318  WBC 28.3*  CREATININE 1.14  LATICACIDVEN 1.6    Estimated Creatinine Clearance: 95.6 mL/min (by C-G formula based on SCr of 1.14 mg/dL).    Allergies  Allergen Reactions   Percocet [Oxycodone-Acetaminophen]     Antimicrobials this admission: Zosyn 12/17 x 1 Vancomycin 12/17 >>  Cefepime 12/17 >>  Dose adjustments this admission: none  Microbiology results: 12/17 BCx: in process   Thank you for allowing pharmacy to be a part of this patient's care.  Johny Pitstick Rodriguez-Guzman PharmD, BCPS 06/04/2022 4:10 PM

## 2022-06-04 NOTE — Assessment & Plan Note (Signed)
-   Resumed home amlodipine 5, irbesartan 150 mg daily, spironolactone 25 mg daily

## 2022-06-04 NOTE — Assessment & Plan Note (Signed)
-   As needed melatonin ordered

## 2022-06-04 NOTE — ED Notes (Signed)
Report given to [facility]

## 2022-06-04 NOTE — Progress Notes (Signed)
       CROSS COVER NOTE  NAME: Rickey Glover. MRN: [id number] DOB : Jul 14, 1960 ATTENDING PHYSICIAN: Criss Alvine, DO    Date of Service   06/04/2022   HPI/Events of Note   2025: Spoke with Dr Verl Dicker, Ortho will consult. Needs to be admitted by Medicine at [facility]. Transfer Center to call back with medicine doc.  2033: Accepted by Dr Mauricia Area to Intermediate Medicine bed     To reach the provider On-Call:   7AM- 7PM see care teams to locate the attending and reach out to them via [email or web address]. 7PM-7AM contact night-coverage If you still have difficulty reaching the appropriate provider, please page the [facility] (Director on Call) for [facility] on amion for assistance  This document was prepared using Set designer software and may include unintentional dictation errors.  Neomia Glass DNP, MBA, FNP-BC Nurse Practitioner [facility] Pager [phone]

## 2022-06-04 NOTE — ED Triage Notes (Signed)
Pt in via EMS from [facility], his residence. EMS reports pt is not ambulatory. Pt c/o pain to right foot. Pt right foot is swollen which is normal to the patient but pt reports what is not normal is his pain. Denies recent injuries. CBG 276  #18 g to left AC, pt was given 142ms of fluid.

## 2022-06-04 NOTE — Assessment & Plan Note (Signed)
-   Check serum magnesium level - Potassium chloride 40 mill equivalent p.o. one-time dose ordered - Repeat BMP in the a.m.

## 2022-06-04 NOTE — Consult Note (Signed)
PHARMACY -  BRIEF ANTIBIOTIC NOTE   Pharmacy has received consult(s) for Vencomycin from an ED provider.  The patient's profile has been reviewed for ht/wt/allergies/indication/available labs.    One time order(s) placed for  Vancomycin '2000mg'$  IV x 1 dose  Further antibiotics/pharmacy consults should be ordered by admitting physician if indicated.                       Thank you, Charda Janis Rodriguez-Guzman PharmD, BCPS 06/04/2022 2:26 PM

## 2022-06-04 NOTE — ED Triage Notes (Signed)
Pt via EMS from home. Pt c/o R foot pain, states that he had surgery in 2019. States that today he has been unable to bear any weight on it this AM. Pt states it not any more swollen than it is normally bur the pain has gotten worse. Pt is A&Ox4 and NAD

## 2022-06-04 NOTE — Assessment & Plan Note (Signed)
-   Atorvastatin 80 mg resumed

## 2022-06-04 NOTE — Assessment & Plan Note (Addendum)
-   Presumed secondary to right foot infection - Repeat CBC in the a.m.

## 2022-06-04 NOTE — Discharge Summary (Addendum)
Physician Discharge Summary   Patient: Rickey Glover. MRN: [id number] DOB: 1960-10-12  Admit date:     06/04/2022  Discharge date: 06/06/22  Discharge Physician: Flora Lipps   PCP: Alford Highland, RN   Recommendations at discharge:    Per [facility]  Discharge Diagnoses: Principal Problem:   Right foot infection Active Problems:   Type 2 diabetes mellitus with hyperglycemia, without long-term current use of insulin (HCC)   Essential hypertension   HLD (hyperlipidemia)   Leukocytosis   Insomnia   Tobacco abuse   OSA (obstructive sleep apnea)   Lumbar radiculopathy   Morbid (severe) obesity due to excess calories ([place])   Hypokalemia   MSSA bacteremia  Resolved Problems:   * No resolved hospital problems. [facility] Course: Rickey. Rickey Glover is a 61 year old male with history of morbid obesity, hypertension, hyperlipidemia, non-insulin-dependent diabetes mellitus, history of spinal laminectomy in August 2023 with recent history of vertebral osteomyelitis and discitis with MSSA bacteremia in August 2023, completed appropriate antibiotic therapy treatment with [facility] infectious disease department,  CAD on aspirin and Plavix, CVA, history of right ankle and tibia fixation hardware to the hospital with right foot pain and swelling.  In the ED patient was mildly tachycardic.  Initial labs showed potassium of 3.1 with a creatinine of 1.1.  WBC was elevated at 28.3.  Lactic acid was 1.6.  ED provider spoke with podiatry Dr. Rhea Belton and CT the foot with and without contrast was performed due to underlying hardware which was [place] developed hardware infection.  Plan is to transfer back to [facility].  At this time patient has been accepted at [facility] pending bed availability.   Assessment and Plan:  CT Ankle/Foot (R) concerning for infected hardware. Dr Doreatha Martin, [facility] Ortho recommending transfer back to [facility] where Rickey Glover had his initial surgery.  Transfer requested via [facility] and Rickey Glover has been accepted to an [facility] bed by Dr Frederik Pear with [facility] Dr Verl Dicker agreeing to consult.  * Right foot infection Right foot pain - Continue with vancomycin and cefepime - Podiatry service has been consulted, Dr. Loel Lofty states podiatry will follow - CT of the right foot and ankle with contrast has been ordered and pending read, if the CT read is positive for infected hardware, either myself or colleague would need to discuss with podiatry further regarding the need for transfer to either [facility] and or outside facility for complicated podiatry infection requiring removal of hardware - Blood cultures, 2 sets, have been ordered and are in process, if positive would recommend a.m. team to order complete echo and discussed case with infectious disease specialist - Symptomatic support with morphine 4 mg IV every 4 hours as needed for moderate pain, 4 doses ordered; fentanyl 50 mcg IV every 2 hours as needed for severe pain, 5 doses ordered - Admit to telemetry medical, inpatient  MSSA bacteremia - History of MSSA bacteremia - Status post IV cefazolin and completion of oral antibiotic and per [facility] infectious disease note, cultures were negative on 02/20/2022  Hypokalemia - Check serum magnesium level - Potassium chloride 40 mill equivalent p.o. one-time dose ordered - Repeat BMP in the a.m.  Morbid (severe) obesity due to excess calories ([place]) - This meets criteria for morbid obesity based on the presence of 1 or more chronic comorbidities. Patient has non-insulin-dependent type hypertension with BMI 35.6. This complicates overall care and prognosis.   OSA (obstructive sleep apnea) -  CPAP nightly ordered  Tobacco abuse - As needed nicotine patch ordered for nicotine craving - Patient is not ready to quit smoking  Insomnia - As needed melatonin ordered  Leukocytosis - Presumed secondary to  right foot infection - Repeat CBC in the a.m.  HLD (hyperlipidemia) - Atorvastatin 80 mg resumed  Essential hypertension - Resumed home amlodipine 5, irbesartan 150 mg daily, spironolactone 25 mg daily  Type 2 diabetes mellitus with hyperglycemia, without long-term current use of insulin (HCC) - Home glipizide and metformin not resumed on admission - Insulin SSI with at bedtime coverage ordered - Goal inpatient blood glucose level is 140-180   Consultants: Dr Loel Lofty, Podiatry and Dr Doreatha Martin, [facility] Procedures performed: N/A Disposition:  Transfer to [place] recommendation:  Cardiac diet DISCHARGE MEDICATION: Allergies as of 06/06/2022       Reactions   Percocet [oxycodone-acetaminophen]         Medication List     STOP taking these medications    albuterol 108 (90 Base) MCG/ACT inhaler Commonly known as: VENTOLIN HFA   benzonatate 100 MG capsule Commonly known as: TESSALON   ferrous sulfate 325 (65 FE) MG tablet   ipratropium 0.06 % nasal spray Commonly known as: ATROVENT   metFORMIN 1000 MG tablet Commonly known as: GLUCOPHAGE   oxyCODONE 5 MG immediate release tablet Commonly known as: Oxy IR/ROXICODONE   traMADol 50 MG tablet Commonly known as: ULTRAM   triamcinolone cream 0.1 % Commonly known as: KENALOG       TAKE these medications    amLODipine 5 MG tablet Commonly known as: NORVASC Take 5 mg by mouth daily.   aspirin EC 81 MG tablet Take 81 mg by mouth daily.   atorvastatin 80 MG tablet Commonly known as: LIPITOR TAKE 1 TABLET(80 MG) BY MOUTH DAILY   furosemide 40 MG tablet Commonly known as: LASIX Take 1 tablet (40 mg total) by mouth daily.   olmesartan 40 MG tablet Commonly known as: BENICAR Take 1 tablet (40 mg total) daily by mouth.   spironolactone 25 MG tablet Commonly known as: ALDACTONE TAKE 1 TABLET(25 MG) BY MOUTH DAILY        Discharge Exam: Filed Weights   06/04/22 1114  Weight: 122.5 kg    General: Adult male, lying in bed , NAD HEENT: MM moist, anicteric, atraumatic, neck supple Neuro: A&O x 4, follows commands, PERRL  CV: s1s2 , RRR, NSR on monitor, no r/m/g Pulm: Regular, non labored on room air , breath sounds clear-BUL & clear-BLL GI: soft, obese, non-tender, bs active x 4 GU: deferred, amber urine in urinal at bedside Skin:  no rashes/lesions noted Extremities: warm/dry, + 2 Radial and Pedal pulses, RLE swelling noted  Condition at discharge: fair  The results of significant diagnostics from this hospitalization (including imaging, microbiology, ancillary and laboratory) are listed below for reference.   Imaging Studies: Rickey Lumbar Spine W Wo Contrast  Result Date: 06/04/2022 CLINICAL DATA:  History of vertebral osteomyelitis in August 2023, status post laminectomy EXAM: MRI LUMBAR SPINE WITHOUT AND WITH CONTRAST TECHNIQUE: Multiplanar and multiecho pulse sequences of the lumbar spine were obtained without and with intravenous contrast. CONTRAST:  42m GADAVIST GADOBUTROL 1 MMOL/ML IV SOLN COMPARISON:  No prior MRI available FINDINGS: Evaluation is somewhat limited by motion and by the inability to link the pre and postcontrast sequences. Segmentation: Transitional anatomy at L5, with partial sacralization on the right. The last fully formed disc space is labeled L5-S1 Alignment:  Mild levocurvature.  No significant listhesis. Vertebrae: No acute fracture; the vertebral body heights are preserved. Congenitally short pedicles, which narrow the AP diameter of the spinal canal. Mildly increased T2 signal and contrast enhancement at the posterosuperior aspect of L3 (series 7, image 10 and series 16, image 13). Mildly increased T2 signal in the L2-L3 disc space. Abnormal enhancement is also noted in the left transverse process distally at L3 (series 16, image 21 and series 7, image 19). Prior laminectomy at L2-L3 and L3-L4, with enhancing material in the laminectomy defect  (series 17, image 19 and 25). Conus medullaris and cauda equina: Conus extends to the L1 level. Conus and cauda equina appear normal. No definite abnormal enhancement. Paraspinal and other soft tissues: Fluid collection in the superficial soft tissues, extending from the presumed incision line (series 10, image 16) into the subcutaneous fat, the main component of which measures up to 1.6 x 2.7 x 2.5 cm (series 5, image 14 and series 10, image 16). A tract extends anteriorly (series 7, images 11-15) and may reach the posterior aspect thecal sac at L2-L3. There are multiple foci of susceptibility within the collection, which may represent air within the collection (series 10, images 18 and 24, for example). Diffuse enhancement within the para spinous musculature, most focally at L2-L4. No focal fluid collection within the musculature. Atrophy of the inferior paraspinous muscles. Disc levels: T12-L1: No significant disc bulge. Mild facet arthropathy. No spinal canal stenosis or neural foraminal narrowing. L1-L2: Minimal disc bulge. Mild facet arthropathy. Epidural lipomatosis. Moderate spinal canal stenosis. No neural foraminal narrowing. L2-L3: Disc height loss with mild disc bulge and superimposed central/left paracentral disc protrusion. Moderate facet arthropathy. Moderate to severe spinal canal stenosis. Moderate bilateral neural foraminal narrowing. L3-L4: Mild disc bulge. Moderate facet arthropathy. Moderate to severe spinal canal stenosis. Moderate bilateral neural foraminal narrowing. L4-L5: Mild disc bulge. Moderate facet arthropathy. Narrowing of the lateral recesses. No spinal canal stenosis. Moderate left and mild right neural foraminal narrowing. L5-S1: Small central disc protrusion. Mild facet arthropathy. No spinal canal stenosis or neural foraminal narrowing. IMPRESSION: 1. Evaluation is somewhat limited by motion and by the inability to link the pre and postcontrast sequences. Within this limitation,  enhancing material is noted in the laminectomy defects at L2-L3 and L3-L4, which may represent granulation tissue although infection cannot be excluded. 2. Small fluid collection in the superficial soft tissues, extending from the presumed the incision site through the subcutaneous fat and muscle and likely extending into the posterior aspect of thecal sac at L2-L3. The collection contains multiple foci suspicious for air, and while it could represent a postoperative seroma or hematoma with surrounding inflammatory changes, an infected collection is suspected. 3. Increased T2 signal and enhancement in the posterosuperior aspect of L3, as well as in the distal left transverse process of L3, which is concerning for osteomyelitis. 4. L2-L3 and L3-L4 moderate to severe spinal canal stenosis and moderate bilateral neural foraminal narrowing. 5. L1-L2 moderate spinal canal stenosis. 6. L4-L5 moderate left and mild right neural foraminal narrowing. Narrowing of the lateral recesses at this level could affect the descending L5 nerve roots. These results will be called to the ordering clinician or representative by the Radiologist Assistant, and communication documented in the PACS or [employer]. Electronically Signed   By: Merilyn Baba M.D.   On: 06/04/2022 21:05   CT FOOT RIGHT W CONTRAST  Result Date: 06/04/2022 CLINICAL DATA:  Osteomyelitis suspected, foot, xray done; Osteomyelitis suspected, ankle, xray done EXAM: CT  OF THE LOWER RIGHT FOOT AND ANKLE WITH CONTRAST TECHNIQUE: Multidetector CT imaging of the lower right extremity was performed according to the standard protocol following intravenous contrast administration. RADIATION DOSE REDUCTION: This exam was performed according to the departmental dose-optimization program which includes automated exposure control, adjustment of the mA and/or kV according to patient size and/or use of iterative reconstruction technique. CONTRAST:  144m OMNIPAQUE IOHEXOL  300 MG/ML  SOLN COMPARISON:  X-ray 06/04/2022 FINDINGS: Bones/Joint/Cartilage Postsurgical changes from a previous tibiotalocalcaneal arthrodesis. Circumferential perihardware lucency throughout the distal most screw within the calcaneus and cuboid. Thin perihardware lucency adjacent to the rod within the tibia and calcaneus. No hardware fracture. Fragmentation of the talus with solid tibiotalar arthrodesis along the anteromedial aspect of the joint. There is also fragmentation of the navicular. No bony bridging across the subtalar joints. Pes planovalgus alignment with findings of subfibular impingement. Nonspecific capsular thickening of the ankle, which may be postsurgical and posttraumatic. There are multiple foci of air within the tibiotalar joint space. No evidence of acute fracture or dislocation. Ligaments Suboptimally assessed by CT. Muscles and Tendons Tibialis anterior tendon is ill-defined at the level of the ankle. Generalized muscle atrophy. Soft tissues Diffuse soft tissue swelling and edema. No organized or rim enhancing fluid collections. IMPRESSION: 1. Diffuse capsular thickening of the ankle joint with several foci of gas in the tibiotalar joint space. Appearance is highly suspicious for septic arthritis. Orthopedic surgery consultation and joint fluid sampling is recommended. 2. Postsurgical changes from a previous tibiotalocalcaneal arthrodesis. Circumferential perihardware lucency throughout the distal most screw within the calcaneus and cuboid, concerning for loosening. 3. Diffuse soft tissue swelling and edema without organized or rim enhancing fluid collections. Electronically Signed   By: NDavina PokeD.O.   On: 06/04/2022 16:13   CT ANKLE RIGHT W CONTRAST  Result Date: 06/04/2022 CLINICAL DATA:  Osteomyelitis suspected, foot, xray done; Osteomyelitis suspected, ankle, xray done EXAM: CT OF THE LOWER RIGHT FOOT AND ANKLE WITH CONTRAST TECHNIQUE: Multidetector CT imaging of the  lower right extremity was performed according to the standard protocol following intravenous contrast administration. RADIATION DOSE REDUCTION: This exam was performed according to the departmental dose-optimization program which includes automated exposure control, adjustment of the mA and/or kV according to patient size and/or use of iterative reconstruction technique. CONTRAST:  1080mOMNIPAQUE IOHEXOL 300 MG/ML  SOLN COMPARISON:  X-ray 06/04/2022 FINDINGS: Bones/Joint/Cartilage Postsurgical changes from a previous tibiotalocalcaneal arthrodesis. Circumferential perihardware lucency throughout the distal most screw within the calcaneus and cuboid. Thin perihardware lucency adjacent to the rod within the tibia and calcaneus. No hardware fracture. Fragmentation of the talus with solid tibiotalar arthrodesis along the anteromedial aspect of the joint. There is also fragmentation of the navicular. No bony bridging across the subtalar joints. Pes planovalgus alignment with findings of subfibular impingement. Nonspecific capsular thickening of the ankle, which may be postsurgical and posttraumatic. There are multiple foci of air within the tibiotalar joint space. No evidence of acute fracture or dislocation. Ligaments Suboptimally assessed by CT. Muscles and Tendons Tibialis anterior tendon is ill-defined at the level of the ankle. Generalized muscle atrophy. Soft tissues Diffuse soft tissue swelling and edema. No organized or rim enhancing fluid collections. IMPRESSION: 1. Diffuse capsular thickening of the ankle joint with several foci of gas in the tibiotalar joint space. Appearance is highly suspicious for septic arthritis. Orthopedic surgery consultation and joint fluid sampling is recommended. 2. Postsurgical changes from a previous tibiotalocalcaneal arthrodesis. Circumferential perihardware lucency throughout the distal most screw  within the calcaneus and cuboid, concerning for loosening. 3. Diffuse soft tissue  swelling and edema without organized or rim enhancing fluid collections. Electronically Signed   By: Davina Poke D.O.   On: 06/04/2022 16:13   DG Foot Complete Right  Result Date: 06/04/2022 CLINICAL DATA:  Foot pain.  Surgery in 2018.  Unable to bear weight. EXAM: RIGHT FOOT COMPLETE - 3+ VIEW COMPARISON:  None Available. FINDINGS: Diffuse marked soft tissue swelling. Postsurgical changes in the hindfoot with fusion. There is lucency surrounding the screw traversing the calcaneus and cuboid. There is lucency surrounding the superior most screw. No acute fractures. No soft tissue gas. No other acute abnormalities. No bony erosion identified to suggest osteomyelitis. IMPRESSION: 1. Marked soft tissue swelling around the foot. 2. The patient is status post fusion of the hindfoot. There is lucency surrounding 2 screws which can be seen with loosening or infection. The chronicity of the lucency cannot be determined as there are no comparison films. 3. No acute fracture.  No bony erosion to suggest osteomyelitis. Electronically Signed   By: Dorise Bullion III M.D.   On: 06/04/2022 11:55    Microbiology: Results for orders placed or performed during the hospital encounter of 06/04/22  Blood culture (routine x 2)     Status: Abnormal   Collection Time: 06/04/22  2:27 PM   Specimen: BLOOD LEFT ARM  Result Value Ref Range Status   Specimen Description   Final    BLOOD LEFT ARM Performed at [facility], [address] [place], [place] [address]    Special Requests   Final    BOTTLES DRAWN AEROBIC AND ANAEROBIC Blood Culture adequate volume Performed at [facility], [address], [place], [place] [address]    Culture  Setup Time   Final    GRAM POSITIVE COCCI AEROBIC BOTTLE ONLY CRITICAL VALUE NOTED.  VALUE IS CONSISTENT WITH PREVIOUSLY REPORTED AND CALLED VALUE.    Culture (A)  Final    STAPHYLOCOCCUS AUREUS SUSCEPTIBILITIES PERFORMED ON PREVIOUS CULTURE WITHIN THE  LAST 5 DAYS. Performed at [facility], [place] [address] [place], [place] [address]    Report Status 06/10/2022 FINAL  Final  Blood culture (routine x 2)     Status: Abnormal   Collection Time: 06/04/22  2:32 PM   Specimen: Right Antecubital; Blood  Result Value Ref Range Status   Specimen Description   Final    RIGHT ANTECUBITAL Performed at [facility], [address] [place], [place] [address]    Special Requests   Final    BOTTLES DRAWN AEROBIC AND ANAEROBIC Blood Culture adequate volume Performed at [facility], [address], [place], [place] [address]    Culture  Setup Time   Final    GRAM POSITIVE COCCI AEROBIC BOTTLE ONLY Organism ID to follow CRITICAL RESULT CALLED TO, READ BACK BY AND VERIFIED WITH: WILL ANDERSON 06/06/2022 AT 2023 SRR Performed at [facility], [address] [place],  [address]    Culture STAPHYLOCOCCUS AUREUS (A)  Final   Report Status 06/09/2022 FINAL  Final   Organism ID, Bacteria STAPHYLOCOCCUS AUREUS  Final      Susceptibility   Staphylococcus aureus - MIC*    CIPROFLOXACIN <=0.5 SENSITIVE Sensitive     ERYTHROMYCIN RESISTANT Resistant     GENTAMICIN <=0.5 SENSITIVE Sensitive     OXACILLIN <=0.25 SENSITIVE Sensitive     TETRACYCLINE <=1 SENSITIVE Sensitive     VANCOMYCIN 1 SENSITIVE Sensitive  TRIMETH/SULFA <=10 SENSITIVE Sensitive     CLINDAMYCIN RESISTANT Resistant     RIFAMPIN <=0.5 SENSITIVE Sensitive     Inducible Clindamycin POSITIVE Resistant     * STAPHYLOCOCCUS AUREUS  Blood Culture ID Panel (Reflexed)     Status: Abnormal   Collection Time: 06/04/22  2:32 PM  Result Value Ref Range Status   Enterococcus faecalis NOT DETECTED NOT DETECTED Final   Enterococcus Faecium NOT DETECTED NOT DETECTED Final   Listeria monocytogenes NOT DETECTED NOT DETECTED Final   Staphylococcus species DETECTED (A) NOT DETECTED Final    Comment: CRITICAL RESULT CALLED TO, READ BACK BY AND VERIFIED  WITH: WILL ANDERSON 06/06/2022 AT 2023 SRR    Staphylococcus aureus (BCID) DETECTED (A) NOT DETECTED Final    Comment: CRITICAL RESULT CALLED TO, READ BACK BY AND VERIFIED WITH: WILL ANDERSON 06/06/2022 AT 2023 SRR    Staphylococcus epidermidis NOT DETECTED NOT DETECTED Final   Staphylococcus lugdunensis NOT DETECTED NOT DETECTED Final   Streptococcus species NOT DETECTED NOT DETECTED Final   Streptococcus agalactiae NOT DETECTED NOT DETECTED Final   Streptococcus pneumoniae NOT DETECTED NOT DETECTED Final   Streptococcus pyogenes NOT DETECTED NOT DETECTED Final   A.calcoaceticus-baumannii NOT DETECTED NOT DETECTED Final   Bacteroides fragilis NOT DETECTED NOT DETECTED Final   Enterobacterales NOT DETECTED NOT DETECTED Final   Enterobacter cloacae complex NOT DETECTED NOT DETECTED Final   Escherichia coli NOT DETECTED NOT DETECTED Final   Klebsiella aerogenes NOT DETECTED NOT DETECTED Final   Klebsiella oxytoca NOT DETECTED NOT DETECTED Final   Klebsiella pneumoniae NOT DETECTED NOT DETECTED Final   Proteus species NOT DETECTED NOT DETECTED Final   Salmonella species NOT DETECTED NOT DETECTED Final   Serratia marcescens NOT DETECTED NOT DETECTED Final   Haemophilus influenzae NOT DETECTED NOT DETECTED Final   Neisseria meningitidis NOT DETECTED NOT DETECTED Final   Pseudomonas aeruginosa NOT DETECTED NOT DETECTED Final   Stenotrophomonas maltophilia NOT DETECTED NOT DETECTED Final   Candida albicans NOT DETECTED NOT DETECTED Final   Candida auris NOT DETECTED NOT DETECTED Final   Candida glabrata NOT DETECTED NOT DETECTED Final   Candida krusei NOT DETECTED NOT DETECTED Final   Candida parapsilosis NOT DETECTED NOT DETECTED Final   Candida tropicalis NOT DETECTED NOT DETECTED Final   Cryptococcus neoformans/gattii NOT DETECTED NOT DETECTED Final   Meth resistant mecA/C and MREJ NOT DETECTED NOT DETECTED Final    Comment: Performed at [facility], [address]  [place], [place] [address]    Labs: CBC: No results for input(s): "WBC", "NEUTROABS", "HGB", "HCT", "MCV", "PLT" in the last 168 hours.  Basic Metabolic Panel: No results for input(s): "NA", "K", "CL", "CO2", "GLUCOSE", "BUN", "CREATININE", "CALCIUM", "MG", "PHOS" in the last 168 hours.  Liver Function Tests: No results for input(s): "AST", "ALT", "ALKPHOS", "BILITOT", "PROT", "ALBUMIN" in the last 168 hours. CBG: No results for input(s): "GLUCAP" in the last 168 hours.   Discharge time spent: greater than 30 minutes.  Signed: Flora Lipps, MD

## 2022-06-04 NOTE — Assessment & Plan Note (Addendum)
Right foot pain - Continue with vancomycin and cefepime - Podiatry service has been consulted, Dr. Loel Lofty states podiatry will follow - CT of the right foot and ankle with contrast has been ordered and pending read, if the CT read is positive for infected hardware, either myself or colleague would need to discuss with podiatry further regarding the need for transfer to either [facility] and or outside facility for complicated podiatry infection requiring removal of hardware - Blood cultures, 2 sets, have been ordered and are in process, if positive would recommend a.m. team to order complete echo and discussed case with infectious disease specialist - Symptomatic support with morphine 4 mg IV every 4 hours as needed for moderate pain, 4 doses ordered; fentanyl 50 mcg IV every 2 hours as needed for severe pain, 5 doses ordered - Admit to telemetry medical, inpatient

## 2022-06-04 NOTE — Assessment & Plan Note (Signed)
-   Home glipizide and metformin not resumed on admission - Insulin SSI with at bedtime coverage ordered - Goal inpatient blood glucose level is 140-180

## 2022-06-04 NOTE — Progress Notes (Addendum)
[facility]ist Note  I received message from podiatry, Dr. Loel Lofty who reviewed the CT of the foot and ankle with contrast and recommends transfer to [facility] for orthopedic consultation for removal of infected hardware.  I discussed case with Dr. Margreta Journey [facility] who reviewed CT of the foot and ankle with contrast and recommend transfer to [facility].  The infected hardware appears to be more complex and patient received care at [facility] with the initial hardware placement at [facility].  ED secretary is made aware that I am requesting a [facility] transfer.  Discussed update with podiatry service.  Transfer to [facility] initiated, discussed with transfer staff and given vitals and pertinent labs.  Currently awaiting callback from transfer center.  Requesting images to be shared.  [employer] process initiated via CT imaging.  Discussed with [facility] cross coverage provider.  Dr. Tobie Poet

## 2022-06-04 NOTE — ED Notes (Signed)
Spoke w/ Nevin Bloodgood at [facility]. No idea when bed will become available at [facility].

## 2022-06-04 NOTE — ED Provider Notes (Signed)
[facility] Provider Note    Event Date/Time   First MD Initiated Contact with Patient 06/04/22 1243     (approximate)   History   Foot Pain   HPI  Rickey Orcutt. is a 61 y.o. male with history of type 2 diabetes, CVA, hypertension, CAD, fixation hardware in the right ankle and tibia, and recent L2-L4 laminectomy with subsequent MSSA wound infection presents to the emergency department for treatment and evaluation of right foot pain and swelling.  Patient denies new injury to the foot or ankle but states that pain has been severe since yesterday. No relief with oxycodone.     Physical Exam   Triage Vital Signs: ED Triage Vitals  Enc Vitals Group     BP 06/04/22 1117 (!) 148/66     Pulse Rate 06/04/22 1117 (!) 107     Resp 06/04/22 1117 20     Temp 06/04/22 1117 98.6 F (37 C)     Temp Source 06/04/22 1117 Oral     SpO2 06/04/22 1117 98 %     Weight 06/04/22 1114 270 lb (122.5 kg)     Height 06/04/22 1114 '6\' 1"'$  (1.854 m)     Head Circumference --      Peak Flow --      Pain Score 06/04/22 1113 10     Pain Loc --      Pain Edu? --      Excl. in [place]? --     Most recent vital signs: Vitals:   06/04/22 1117  BP: (!) 148/66  Pulse: (!) 107  Resp: 20  Temp: 98.6 F (37 C)  SpO2: 98%     General: Awake, no distress.  CV:  Good peripheral perfusion.  Resp:  Normal effort.  Abd:  No distention.  Other:  Right foot edematous without erythema.    ED Results / Procedures / Treatments   Labs (all labs ordered are listed, but only abnormal results are displayed) Labs Reviewed  BASIC METABOLIC PANEL - Abnormal; Notable for the following components:      Result Value   Potassium 3.1 (*)    CO2 21 (*)    Glucose, Bld 200 (*)    Calcium 8.3 (*)    All other components within normal limits  CBC WITH DIFFERENTIAL/PLATELET - Abnormal; Notable for the following components:   WBC 28.3 (*)    RBC 3.88 (*)    Hemoglobin 10.1 (*)     HCT 33.6 (*)    Neutro Abs 25.0 (*)    Monocytes Absolute 1.8 (*)    Abs Immature Granulocytes 0.21 (*)    All other components within normal limits  CULTURE, BLOOD (ROUTINE X 2)  CULTURE, BLOOD (ROUTINE X 2)  LACTIC ACID, PLASMA  LACTIC ACID, PLASMA  URINALYSIS, ROUTINE W REFLEX MICROSCOPIC  SEDIMENTATION RATE  HIV ANTIBODY (ROUTINE TESTING W REFLEX)  PHOSPHORUS  MAGNESIUM     EKG  Not indicated.   RADIOLOGY  Soft tissue swelling around right foot and.  Lucency surrounding 2 screws concerning for loosening or infection..  No obvious bony erosion  PROCEDURES:  Critical Care performed: No  Procedures   MEDICATIONS ORDERED IN ED: Medications  vancomycin (VANCOREADY) IVPB 2000 mg/400 mL (has no administration in time range)  traMADol (ULTRAM) tablet 50 mg (has no administration in time range)  clonazePAM (KLONOPIN) tablet 0.5 mg (has no administration in time range)  acetaminophen (TYLENOL) tablet 650 mg (has no administration in  time range)    Or  acetaminophen (TYLENOL) suppository 650 mg (has no administration in time range)  ondansetron (ZOFRAN) tablet 4 mg (has no administration in time range)    Or  ondansetron (ZOFRAN) injection 4 mg (has no administration in time range)  heparin injection 5,000 Units (has no administration in time range)  senna-docusate (Senokot-S) tablet 1 tablet (has no administration in time range)  LORazepam (ATIVAN) injection 0.5 mg (has no administration in time range)  piperacillin-tazobactam (ZOSYN) IVPB 3.375 g (0 g Intravenous Stopped 06/04/22 1510)  0.9 %  sodium chloride infusion ( Intravenous New Bag/Given 06/04/22 1414)  fentaNYL (SUBLIMAZE) injection 100 mcg (100 mcg Intravenous Given 06/04/22 1457)     IMPRESSION / MDM / ASSESSMENT AND PLAN / ED COURSE  I reviewed the triage vital signs and the nursing notes.                              Differential diagnosis includes, but is not limited to, fracture, osteomyelitis,  ankle sprain  Patient's presentation is most consistent with an acute and potentially critical illness.  61 year old male presenting to the emergency department for treatment and evaluation of right foot and ankle pain and swelling that started yesterday.  Patient has a complex history of ankle fusion with revision and removal of ankle implant.  Last surgery was in July 2021 due to avascular necrosis.  In addition, he had L2-L4 laminectomies performed and in August of this year with subsequent vertebral osteomyelitis and MSSA bacteremia for which he was on antibiotics until November.  Patient denies having any back pain at this time and feels that this is completely unrelated to his pain today.  Today, he has a significant leukocytosis of 28.3 with a neutrophil count of 25 and 1.8 monocytes.  His hemoglobin is 10.1 with hematocrit 33.6.  In comparison to 1 month ago his white blood cell count was 12.5 with a hemoglobin of 11.7 and hematocrit of 36.1.  BMP shows a glucose of 200 with a normal sodium. Mild hypokalemia at 3.1 and calcium at 8.3. Initial lactic is normal at 1.6.  Case initially discussed with Dr. Luana Shu who is on-call with the [facility] clinic podiatry group.  He advises that the patient will go to the unassigned podiatrist but did recommend MRI.  Discussed with Dr. Loel Lofty who is on-call for the unassigned podiatry group.  He agrees to follow the patient in consult during admission.  He also recommended considering imaging of the lumbar spine to ensure that the osteomyelitis had in fact completely resolved in that area as well.  Hospitalist consult has been requested and will defer imaging of the lumbar to their discretion.  Imaging order changed to CT of the foot and ankle with and without contrast.  Patient accepted for admission by Dr. Rupert Stacks.     FINAL CLINICAL IMPRESSION(S) / ED DIAGNOSES   Final diagnoses:  Osteomyelitis of right foot, unspecified type ([place])     Rx /  DC Orders   ED Discharge Orders     None        Note:  This document was prepared using Dragon voice recognition software and may include unintentional dictation errors.   Victorino Dike, FNP 06/04/22 1529    Rada Hay, MD 06/04/22 [id number]

## 2022-06-04 NOTE — H&P (Addendum)
History and Physical   Rickey Glover. HEN:[id number] DOB: 12-01-1960 DOA: 06/04/2022  PCP: Marko Stai, NP Outpatient Specialists: Dr. Lacinda Axon, [facility] Patient coming from: [facility] via EMS  I have personally briefly reviewed patient's old medical records in [place].  Chief Concern: Right foot pain  HPI: [name] is a 61 year old male with history of morbid obesity, hypertension, hyperlipidemia, non-insulin-dependent diabetes mellitus, history of spinal laminectomy in August 2023 with recent history of vertebral osteomyelitis and discitis with MSSA bacteremia in August 2023, completed appropriate antibiotic therapy treatment with [facility] infectious disease department, negative repeat blood cultures and 02/20/2022, CAD on aspirin and Plavix, CVA, history of right ankle and tibia fixation hardware who presents to the emergency department via EMS for chief concerns of right foot pain and swelling.  Initial vitals in the emergency department showed temperature of 98.6, respiration rate of 20, heart rate of 107, blood pressure 148/66, SpO2 of 98% on room air.  Serum sodium is 137, potassium 3.1, chloride 104, bicarb 21, BUN of 11, serum creatinine of 1.14, EGFR greater than 60, nonfasting blood glucose 200, WBC 28.3, hemoglobin 10.1, platelets of 372.  Lactic acid is 1.6.  EDP discussed with podiatrist, Dr. Rhea Belton who recommends discontinuing MRI due to the hardware in his right foot and instead order CT the foot with and without contrast.  ED treatment: Fentanyl 100 mcg IV one-time dose, ondansetron Zosyn, vancomycin per pharmacy. ------------------------ At bedside, he is able to tell his name, age, current calendar year.   He reports that his right foot started severely hurting today.  He reports he is not able to ambulate therefore he called EMS.  He denies trauma to his person.  He denies fever, chills, nausea, vomiting, chest pain, shortness  of breath, dysuria, hematuria, diarrhea.  Social history: He lives by himself at a hotel. He smokes 1/2 pack per day. He denies recreational drug and etoh use. He drives truck trailer.  ROS: Constitutional: no weight change, no fever ENT/Mouth: no sore throat, no rhinorrhea Eyes: no eye pain, no vision changes Cardiovascular: no chest pain, no dyspnea,  no edema, no palpitations Respiratory: no cough, no sputum, no wheezing Gastrointestinal: no nausea, no vomiting, no diarrhea, no constipation Genitourinary: no urinary incontinence, no dysuria, no hematuria Musculoskeletal: no arthralgias, no myalgias, right foot pain Skin: no skin lesions, no pruritus, Neuro: + weakness, no loss of consciousness, no syncope Psych: no anxiety, no depression, no decrease appetite Heme/Lymph: no bruising, no bleeding  ED Course: Discussed with emergency medicine provider, patient requiring sedation for chief concerns of possible right foot infection.  Assessment/Plan  Principal Problem:   Right foot infection Active Problems:   Type 2 diabetes mellitus with hyperglycemia, without long-term current use of insulin (HCC)   Essential hypertension   HLD (hyperlipidemia)   Leukocytosis   Insomnia   Tobacco abuse   OSA (obstructive sleep apnea)   Lumbar radiculopathy   Morbid (severe) obesity due to excess calories (HCC)   Hypokalemia   MSSA bacteremia   Assessment and Plan:  * Right foot infection Right foot pain - Continue with vancomycin and cefepime - Podiatry service has been consulted, Dr. Loel Lofty states podiatry will follow - CT of the right foot and ankle with contrast has been ordered and pending read, if the CT read is positive for infected hardware, either myself or colleague would need to discuss with podiatry further regarding the need for transfer to either [facility] and or outside facility  for complicated podiatry infection requiring removal of hardware - Blood cultures, 2 sets,  have been ordered and are in process, if positive would recommend a.m. team to order complete echo and discussed case with infectious disease specialist - Symptomatic support with morphine 4 mg IV every 4 hours as needed for moderate pain, 4 doses ordered; fentanyl 50 mcg IV every 2 hours as needed for severe pain, 5 doses ordered - Admit to telemetry medical, inpatient  MSSA bacteremia - History of MSSA bacteremia - Status post IV cefazolin and completion of oral antibiotic and per [facility] infectious disease note, cultures were negative on 02/20/2022  Hypokalemia - Check serum magnesium level - Potassium chloride 40 mill equivalent p.o. one-time dose ordered - Repeat BMP in the a.m.  Morbid (severe) obesity due to excess calories ([place]) - This meets criteria for morbid obesity based on the presence of 1 or more chronic comorbidities. Patient has non-insulin-dependent type hypertension with BMI 35.6. This complicates overall care and prognosis.   OSA (obstructive sleep apnea) - CPAP nightly ordered  Tobacco abuse - As needed nicotine patch ordered for nicotine craving - Patient is not ready to quit smoking  Insomnia - As needed melatonin ordered  Leukocytosis - Presumed secondary to right foot infection - Repeat CBC in the a.m.  HLD (hyperlipidemia) - Atorvastatin 80 mg resumed  Essential hypertension - Resumed home amlodipine 5, irbesartan 150 mg daily, spironolactone 25 mg daily  Type 2 diabetes mellitus with hyperglycemia, without long-term current use of insulin (HCC) - Home glipizide and metformin not resumed on admission - Insulin SSI with at bedtime coverage ordered - Goal inpatient blood glucose level is 140-180  Chart reviewed.   DVT prophylaxis: Heparin 5000 units subcutaneous every 8 hours, 1 dose ordered; AM team to reinitiate pharmacologic DVT prophylaxis when the benefits outweigh the risk Code Status: Full code Diet: Heart healthy/carb modified Family  Communication: None at this time Disposition Plan: Pending clinical course Consults called: Podiatry Admission status: Telemetry medical, inpatient  Past Medical History:  Diagnosis Date   Chronic back pain    DDD (degenerative disc disease), lumbar    Diabetes mellitus without complication (HCC)    Hip pain    Hyperlipidemia    Hypertension    Insomnia    Knee pain, left    LAP-BAND surgery status    Sciatica of right side    Sleep apnea    new DX - has not been for CPAP titration yet   Vitamin D deficiency    Past Surgical History:  Procedure Laterality Date   COLONOSCOPY WITH PROPOFOL N/A 07/15/2015   Procedure: COLONOSCOPY WITH PROPOFOL;  Surgeon: Lucilla Lame, MD;  Location: [facility];  Service: Endoscopy;  Laterality: N/A;  Diabetic - oral meds Sleep apnea   JOINT REPLACEMENT Right 2016   lap band surgery     LUMBAR SPINE SURGERY  April 2017   Social History:  reports that he has been smoking cigarettes. He has a 10.00 pack-year smoking history. He has never used smokeless tobacco. He reports that he does not drink alcohol and does not use drugs.  Allergies  Allergen Reactions   Percocet [Oxycodone-Acetaminophen]    Family History  Problem Relation Age of Onset   Aneurysm Mother        Brain   Stroke Maternal Grandmother    Stroke Paternal Grandmother    Family history: Family history reviewed and not pertinent  Prior to Admission medications   Medication Sig Start Date  End Date Taking? Authorizing Provider  albuterol (PROVENTIL HFA;VENTOLIN HFA) 108 (90 Base) MCG/ACT inhaler Inhale 2 puffs every 6 (six) hours as needed into the lungs for wheezing or shortness of breath. 05/02/17   Kathrine Haddock, NP  amLODipine (NORVASC) 10 MG tablet TAKE 1 TABLET(10 MG) BY MOUTH DAILY 09/05/17   Park Liter P, DO  aspirin EC 81 MG tablet Take 81 mg by mouth daily.    [provider]  atorvastatin (LIPITOR) 80 MG tablet TAKE 1 TABLET(80 MG) BY MOUTH DAILY  08/06/17   Johnson, Megan P, DO  benzonatate (TESSALON) 100 MG capsule Take 1 capsule (100 mg total) by mouth 3 (three) times daily as needed. 07/05/18   Coral Spikes, DO  clonazePAM (KLONOPIN) 0.5 MG tablet 1 tablet at bedtime as needed (try every other day at least) 03/09/17   Park Liter P, DO  clopidogrel (PLAVIX) 75 MG tablet TAKE 1 TABLET BY MOUTH EVERY DAY 02/05/18   Trinna Post, PA-C  furosemide (LASIX) 40 MG tablet Take 1 tablet (40 mg total) by mouth daily. 05/21/17   Johnson, Megan P, DO  glimepiride (AMARYL) 4 MG tablet TAKE 1 TABLET(4 MG) BY MOUTH TWICE DAILY 12/07/17   Johnson, Megan P, DO  ipratropium (ATROVENT) 0.06 % nasal spray Place 2 sprays into both nostrils 4 (four) times daily as needed for rhinitis. 07/05/18   Coral Spikes, DO  metFORMIN (GLUCOPHAGE-XR) 500 MG 24 hr tablet Take 1 tablet (500 mg total) by mouth 2 (two) times daily with a meal. 02/23/17   Johnson, Megan P, DO  olmesartan (BENICAR) 40 MG tablet Take 1 tablet (40 mg total) daily by mouth. 04/25/17   Kathrine Haddock, NP  spironolactone (ALDACTONE) 25 MG tablet TAKE 1 TABLET(25 MG) BY MOUTH DAILY 03/06/18   Johnson, Megan P, DO  traMADol (ULTRAM) 50 MG tablet Take 1 tablet (50 mg total) by mouth every 8 (eight) hours as needed. 07/05/18   Coral Spikes, DO  triamcinolone cream (KENALOG) 0.1 % Apply 1 application 2 (two) times daily topically. 04/25/17   Kathrine Haddock, NP  TRULICITY 1.5 GO/1.1XB [facility]  06/23/18   [provider]   Physical Exam: Vitals:   06/04/22 1114 06/04/22 1117 06/04/22 1707 06/04/22 1919  BP:  (!) 148/66  (!) 130/43  Pulse:  (!) 107  (!) 107  Resp:  20  18  Temp:  98.6 F (37 C) 98.1 F (36.7 C)   TempSrc:  Oral Oral   SpO2:  98%  94%  Weight: 122.5 kg     Height: _0  (1.854 m)      Constitutional: appears older than chronological age, NAD, calm, comfortable Eyes: PERRL, lids and conjunctivae normal ENMT: Mucous membranes are moist. Posterior pharynx clear of any exudate or  lesions. Age-appropriate dentition. Hearing appropriate Neck: normal, supple, no masses, no thyromegaly Respiratory: clear to auscultation bilaterally, no wheezing, no crackles. Normal respiratory effort. No accessory muscle use.  Cardiovascular: Regular rate and rhythm, no murmurs / rubs / gallops.  Swelling of the right lower extremity edema. 2+ pedal pulses. No carotid bruits.  Abdomen: Morbidly obese abdomen, no tenderness, no masses palpated, no hepatosplenomegaly. Bowel sounds positive.  Musculoskeletal: no clubbing / cyanosis. No joint deformity upper and lower extremities. Good ROM, no contractures, no atrophy. Normal muscle tone.  Skin: no rashes, lesions, ulcers. No induration Neurologic: Sensation intact. Strength 5/5 in all 4.  Psychiatric: Normal judgment and insight. Alert and oriented x 3. Normal mood.  EKG: Ordered x-rays on Admission: I personally reviewed and I agree with radiologist reading as below.  CT FOOT RIGHT W CONTRAST  Result Date: 06/04/2022 CLINICAL DATA:  Osteomyelitis suspected, foot, xray done; Osteomyelitis suspected, ankle, xray done EXAM: CT OF THE LOWER RIGHT FOOT AND ANKLE WITH CONTRAST TECHNIQUE: Multidetector CT imaging of the lower right extremity was performed according to the standard protocol following intravenous contrast administration. RADIATION DOSE REDUCTION: This exam was performed according to the departmental dose-optimization program which includes automated exposure control, adjustment of the mA and/or kV according to patient size and/or use of iterative reconstruction technique. CONTRAST:  124m OMNIPAQUE IOHEXOL 300 MG/ML  SOLN COMPARISON:  X-ray 06/04/2022 FINDINGS: Bones/Joint/Cartilage Postsurgical changes from a previous tibiotalocalcaneal arthrodesis. Circumferential perihardware lucency throughout the distal most screw within the calcaneus and cuboid. Thin perihardware lucency adjacent to the rod within the tibia and calcaneus. No hardware  fracture. Fragmentation of the talus with solid tibiotalar arthrodesis along the anteromedial aspect of the joint. There is also fragmentation of the navicular. No bony bridging across the subtalar joints. Pes planovalgus alignment with findings of subfibular impingement. Nonspecific capsular thickening of the ankle, which may be postsurgical and posttraumatic. There are multiple foci of air within the tibiotalar joint space. No evidence of acute fracture or dislocation. Ligaments Suboptimally assessed by CT. Muscles and Tendons Tibialis anterior tendon is ill-defined at the level of the ankle. Generalized muscle atrophy. Soft tissues Diffuse soft tissue swelling and edema. No organized or rim enhancing fluid collections. IMPRESSION: 1. Diffuse capsular thickening of the ankle joint with several foci of gas in the tibiotalar joint space. Appearance is highly suspicious for septic arthritis. Orthopedic surgery consultation and joint fluid sampling is recommended. 2. Postsurgical changes from a previous tibiotalocalcaneal arthrodesis. Circumferential perihardware lucency throughout the distal most screw within the calcaneus and cuboid, concerning for loosening. 3. Diffuse soft tissue swelling and edema without organized or rim enhancing fluid collections. Electronically Signed   By: NDavina PokeD.O.   On: 06/04/2022 16:13   CT ANKLE RIGHT W CONTRAST  Result Date: 06/04/2022 CLINICAL DATA:  Osteomyelitis suspected, foot, xray done; Osteomyelitis suspected, ankle, xray done EXAM: CT OF THE LOWER RIGHT FOOT AND ANKLE WITH CONTRAST TECHNIQUE: Multidetector CT imaging of the lower right extremity was performed according to the standard protocol following intravenous contrast administration. RADIATION DOSE REDUCTION: This exam was performed according to the departmental dose-optimization program which includes automated exposure control, adjustment of the mA and/or kV according to patient size and/or use of  iterative reconstruction technique. CONTRAST:  1084mOMNIPAQUE IOHEXOL 300 MG/ML  SOLN COMPARISON:  X-ray 06/04/2022 FINDINGS: Bones/Joint/Cartilage Postsurgical changes from a previous tibiotalocalcaneal arthrodesis. Circumferential perihardware lucency throughout the distal most screw within the calcaneus and cuboid. Thin perihardware lucency adjacent to the rod within the tibia and calcaneus. No hardware fracture. Fragmentation of the talus with solid tibiotalar arthrodesis along the anteromedial aspect of the joint. There is also fragmentation of the navicular. No bony bridging across the subtalar joints. Pes planovalgus alignment with findings of subfibular impingement. Nonspecific capsular thickening of the ankle, which may be postsurgical and posttraumatic. There are multiple foci of air within the tibiotalar joint space. No evidence of acute fracture or dislocation. Ligaments Suboptimally assessed by CT. Muscles and Tendons Tibialis anterior tendon is ill-defined at the level of the ankle. Generalized muscle atrophy. Soft tissues Diffuse soft tissue swelling and edema. No organized or rim enhancing fluid collections. IMPRESSION: 1. Diffuse capsular thickening of the ankle joint with  several foci of gas in the tibiotalar joint space. Appearance is highly suspicious for septic arthritis. Orthopedic surgery consultation and joint fluid sampling is recommended. 2. Postsurgical changes from a previous tibiotalocalcaneal arthrodesis. Circumferential perihardware lucency throughout the distal most screw within the calcaneus and cuboid, concerning for loosening. 3. Diffuse soft tissue swelling and edema without organized or rim enhancing fluid collections. Electronically Signed   By: Davina Poke D.O.   On: 06/04/2022 16:13   DG Foot Complete Right  Result Date: 06/04/2022 CLINICAL DATA:  Foot pain.  Surgery in 2018.  Unable to bear weight. EXAM: RIGHT FOOT COMPLETE - 3+ VIEW COMPARISON:  None Available.  FINDINGS: Diffuse marked soft tissue swelling. Postsurgical changes in the hindfoot with fusion. There is lucency surrounding the screw traversing the calcaneus and cuboid. There is lucency surrounding the superior most screw. No acute fractures. No soft tissue gas. No other acute abnormalities. No bony erosion identified to suggest osteomyelitis. IMPRESSION: 1. Marked soft tissue swelling around the foot. 2. The patient is status post fusion of the hindfoot. There is lucency surrounding 2 screws which can be seen with loosening or infection. The chronicity of the lucency cannot be determined as there are no comparison films. 3. No acute fracture.  No bony erosion to suggest osteomyelitis. Electronically Signed   By: Dorise Bullion III M.D.   On: 06/04/2022 11:55    Labs on Admission: I have personally reviewed following labs  CBC: Recent Labs  Lab 06/04/22 1318  WBC 28.3*  NEUTROABS 25.0*  HGB 10.1*  HCT 33.6*  MCV 86.6  PLT 023   Basic Metabolic Panel: Recent Labs  Lab 06/04/22 1318  NA 137  K 3.1*  CL 104  CO2 21*  GLUCOSE 200*  BUN 11  CREATININE 1.14  CALCIUM 8.3*   GFR: Estimated Creatinine Clearance: 95.6 mL/min (by C-G formula based on SCr of 1.14 mg/dL).  Urine analysis:    Component Value Date/Time   COLORURINE YELLOW (A) 06/04/2022 1658   APPEARANCEUR CLOUDY (A) 06/04/2022 1658   APPEARANCEUR Clear 02/23/2017 1610   LABSPEC >1.046 (H) 06/04/2022 1658   PHURINE 5.0 06/04/2022 1658   GLUCOSEU NEGATIVE 06/04/2022 1658   HGBUR NEGATIVE 06/04/2022 1658   BILIRUBINUR NEGATIVE 06/04/2022 1658   BILIRUBINUR Negative 09/15/2016 1611   KETONESUR NEGATIVE 06/04/2022 1658   PROTEINUR >=300 (A) 06/04/2022 1658   NITRITE NEGATIVE 06/04/2022 1658   LEUKOCYTESUR NEGATIVE 06/04/2022 1658   This document was prepared using Dragon Voice Recognition software and may include unintentional dictation errors.  Dr. Tobie Poet [facility]  If 7PM-7AM, please contact  overnight-coverage provider If 7AM-7PM, please contact day coverage provider [email or web address]  06/04/2022, 7:36 PM

## 2022-06-04 NOTE — ED Notes (Signed)
[facility] called and requested images to be PowerShared. Merry Proud in [place] will powershare.

## 2022-06-04 NOTE — Assessment & Plan Note (Signed)
-   History of MSSA bacteremia - Status post IV cefazolin and completion of oral antibiotic and per [facility] infectious disease note, cultures were negative on 02/20/2022

## 2022-06-04 NOTE — Progress Notes (Signed)
Patient states he does not wear bipap/cpap unit at home and does not want to be placed on hospital unit. No unit at bedside.

## 2022-06-05 DIAGNOSIS — B9561 Methicillin susceptible Staphylococcus aureus infection as the cause of diseases classified elsewhere: Secondary | ICD-10-CM

## 2022-06-05 DIAGNOSIS — F5101 Primary insomnia: Secondary | ICD-10-CM

## 2022-06-05 DIAGNOSIS — E1165 Type 2 diabetes mellitus with hyperglycemia: Secondary | ICD-10-CM

## 2022-06-05 DIAGNOSIS — E782 Mixed hyperlipidemia: Secondary | ICD-10-CM

## 2022-06-05 DIAGNOSIS — M5416 Radiculopathy, lumbar region: Secondary | ICD-10-CM

## 2022-06-05 DIAGNOSIS — D72829 Elevated white blood cell count, unspecified: Secondary | ICD-10-CM

## 2022-06-05 DIAGNOSIS — I10 Essential (primary) hypertension: Secondary | ICD-10-CM

## 2022-06-05 DIAGNOSIS — G4733 Obstructive sleep apnea (adult) (pediatric): Secondary | ICD-10-CM

## 2022-06-05 DIAGNOSIS — L089 Local infection of the skin and subcutaneous tissue, unspecified: Secondary | ICD-10-CM | POA: Diagnosis not present

## 2022-06-05 DIAGNOSIS — R7881 Bacteremia: Secondary | ICD-10-CM

## 2022-06-05 DIAGNOSIS — M86672 Other chronic osteomyelitis, left ankle and foot: Secondary | ICD-10-CM

## 2022-06-05 LAB — CBC
HCT: 31.3 % — ABNORMAL LOW (ref 39.0–52.0)
HCT: 31.1 % — ABNORMAL LOW (ref 39.0–52.0)
Hemoglobin: 10.1 g/dL — ABNORMAL LOW (ref 13.0–17.0)
Hemoglobin: 10 g/dL — ABNORMAL LOW (ref 13.0–17.0)
MCH: 26.2 pg (ref 26.0–34.0)
MCH: 25.7 pg — ABNORMAL LOW (ref 26.0–34.0)
MCHC: 32.3 g/dL (ref 30.0–36.0)
MCHC: 32.2 g/dL (ref 30.0–36.0)
MCV: 81.3 fL (ref 80.0–100.0)
MCV: 79.9 fL — ABNORMAL LOW (ref 80.0–100.0)
Platelets: 427 10*3/uL — ABNORMAL HIGH (ref 150–400)
Platelets: 440 10*3/uL — ABNORMAL HIGH (ref 150–400)
RBC: 3.85 MIL/uL — ABNORMAL LOW (ref 4.22–5.81)
RBC: 3.89 MIL/uL — ABNORMAL LOW (ref 4.22–5.81)
RDW: 15.5 % (ref 11.5–15.5)
RDW: 15.3 % (ref 11.5–15.5)
WBC: 29 10*3/uL — ABNORMAL HIGH (ref 4.0–10.5)
WBC: 25 10*3/uL — ABNORMAL HIGH (ref 4.0–10.5)
nRBC: 0 % (ref 0.0–0.2)
nRBC: 0 % (ref 0.0–0.2)

## 2022-06-05 LAB — BASIC METABOLIC PANEL
Anion gap: 9 (ref 5–15)
BUN: 14 mg/dL (ref 6–20)
CO2: 24 mmol/L (ref 22–32)
Calcium: 8.4 mg/dL — ABNORMAL LOW (ref 8.9–10.3)
Chloride: 101 mmol/L (ref 98–111)
Creatinine, Ser: 1.3 mg/dL — ABNORMAL HIGH (ref 0.61–1.24)
GFR, Estimated: >60 mL/min (ref >60)
Glucose, Bld: 204 mg/dL — ABNORMAL HIGH (ref 70–99)
Potassium: 3.3 mmol/L — ABNORMAL LOW (ref 3.5–5.1)
Sodium: 134 mmol/L — ABNORMAL LOW (ref 135–145)

## 2022-06-05 LAB — HEMOGLOBIN A1C
Hgb A1c MFr Bld: 7.1 % — ABNORMAL HIGH (ref 4.8–5.6)
Mean Plasma Glucose: 157 mg/dL

## 2022-06-05 LAB — SEDIMENTATION RATE: Sed Rate: 65 mm/hr — ABNORMAL HIGH (ref 0–20)

## 2022-06-05 LAB — CBG MONITORING, ED
Glucose-Capillary: 185 mg/dL — ABNORMAL HIGH (ref 70–99)
Glucose-Capillary: 163 mg/dL — ABNORMAL HIGH (ref 70–99)
Glucose-Capillary: 190 mg/dL — ABNORMAL HIGH (ref 70–99)
Glucose-Capillary: 209 mg/dL — ABNORMAL HIGH (ref 70–99)

## 2022-06-05 LAB — HIV ANTIBODY (ROUTINE TESTING W REFLEX): HIV Screen 4th Generation wRfx: NONREACTIVE

## 2022-06-05 LAB — PHOSPHORUS: Phosphorus: 2 mg/dL — ABNORMAL LOW (ref 2.5–4.6)

## 2022-06-05 LAB — MAGNESIUM: Magnesium: 1.5 mg/dL — ABNORMAL LOW (ref 1.7–2.4)

## 2022-06-05 MED ORDER — POTASSIUM CHLORIDE CRYS ER 20 MEQ PO TBCR
40.0000 meq | EXTENDED_RELEASE_TABLET | Freq: Once | ORAL | Status: AC
  Administered 2022-06-05: 40 meq via ORAL
  Filled 2022-06-05: qty 2

## 2022-06-05 MED ORDER — HYDROMORPHONE HCL 1 MG/ML IJ SOLN
1.0000 mg | INTRAMUSCULAR | Status: DC | PRN
  Administered 2022-06-05 – 2022-06-06 (×7): 1 mg via INTRAVENOUS
  Filled 2022-06-05 (×7): qty 1

## 2022-06-05 MED ORDER — MAGNESIUM SULFATE 2 GM/50ML IV SOLN
2.0000 g | Freq: Once | INTRAVENOUS | Status: AC
  Administered 2022-06-05: 2 g via INTRAVENOUS
  Filled 2022-06-05: qty 50

## 2022-06-05 NOTE — ED Notes (Signed)
Called [facility] for update on bed assignment. Bed should become available after discharges this morning.

## 2022-06-05 NOTE — ED Notes (Signed)
The pt has been updated as there is no change at this time in his transfer status, the pt acknowledges his understanding at this time. Close monitoring continued.

## 2022-06-05 NOTE — Progress Notes (Signed)
PROGRESS NOTE    [name].  GOT:[id number] DOB: 1960-12-14 DOA: 06/04/2022 PCP: Alford Highland, RN    Brief Narrative:  [name] is a 61 year old male with history of morbid obesity, hypertension, hyperlipidemia, non-insulin-dependent diabetes mellitus, history of spinal laminectomy in August 2023 with recent history of vertebral osteomyelitis and discitis with MSSA bacteremia in August 2023, completed appropriate antibiotic therapy treatment with [facility] infectious disease department,  CAD on aspirin and Plavix, CVA, history of right ankle and tibia fixation hardware to the hospital with right foot pain and swelling.  In the ED patient was mildly tachycardic.  Initial labs showed potassium of 3.1 with a creatinine of 1.1.  WBC was elevated at 28.3.  Lactic acid was 1.6.  ED provider spoke with podiatry Dr. Rhea Belton and CT the foot with and without contrast was performed due to underlying hardware which was [place] developed hardware infection.  Plan is to transfer back to [facility].  At this time patient has been accepted at [facility] pending bed availability.  Assessment and plan    Right foot infection/Right foot pain secondary to hardware infection. Continue vancomycin cefepime while in the ED.  Plan for transfer to [facility] as per podiatry.  Patient has been accepted pending bed.  Continue symptomatic care.  History of MSSA bacteremia - Status post IV cefazolin and completion of oral antibiotic and per [facility] infectious disease note, cultures were negative on 02/20/2022.  Repeat blood cultures sent 08/05/2021 negative in 24 hours.   Hypokalemia Mild at 3.3 today.  Will replace orally.  Hypomagnesemia.  Will replace with 2 g of IV magnesium sulfate today.  Morbid (severe) obesity due to excess calories (HCC) Body mass index is 35.62 kg/m.  Would benefit from weight loss as outpatient.   OSA (obstructive sleep apnea) Continue CPAP at nighttime    Tobacco abuse Continue nicotine patch   Insomnia On as needed melatonin   Leukocytosis Likely secondary to foot infection.  WBC today at 29.0.   Hyperlipidemia Continue atorvastatin    Essential hypertension Continue amlodipine 5, irbesartan 150 mg daily, spironolactone 25 mg daily   Type 2 diabetes mellitus with hyperglycemia, without long-term current use of insulin (HCC) On sliding scale.  On glipizide and metformin at home.             Assessment and Plan: * Right foot infection Right foot pain - Continue with vancomycin and cefepime - Podiatry service has been consulted, Dr. Loel Lofty states podiatry will follow - CT of the right foot and ankle with contrast has been ordered and pending read, if the CT read is positive for infected hardware, either myself or colleague would need to discuss with podiatry further regarding the need for transfer to either [facility] and or outside facility for complicated podiatry infection requiring removal of hardware - Blood cultures, 2 sets, have been ordered and are in process, if positive would recommend a.m. team to order complete echo and discussed case with infectious disease specialist - Symptomatic support with morphine 4 mg IV every 4 hours as needed for moderate pain, 4 doses ordered; fentanyl 50 mcg IV every 2 hours as needed for severe pain, 5 doses ordered - Admit to telemetry medical, inpatient  MSSA bacteremia - History of MSSA bacteremia - Status post IV cefazolin and completion of oral antibiotic and per [facility] infectious disease note, cultures were negative on 02/20/2022  Hypokalemia - Check serum magnesium level - Potassium chloride 40 mill equivalent p.o. one-time  dose ordered - Repeat BMP in the a.m.  Morbid (severe) obesity due to excess calories ([place]) - This meets criteria for morbid obesity based on the presence of 1 or more chronic comorbidities. Patient has non-insulin-dependent type hypertension with BMI  35.6. This complicates overall care and prognosis.   OSA (obstructive sleep apnea) - CPAP nightly ordered  Tobacco abuse - As needed nicotine patch ordered for nicotine craving - Patient is not ready to quit smoking  Insomnia - As needed melatonin ordered  Leukocytosis - Presumed secondary to right foot infection - Repeat CBC in the a.m.  HLD (hyperlipidemia) - Atorvastatin 80 mg resumed  Essential hypertension - Resumed home amlodipine 5, irbesartan 150 mg daily, spironolactone 25 mg daily  Type 2 diabetes mellitus with hyperglycemia, without long-term current use of insulin (HCC) - Home glipizide and metformin not resumed on admission - Insulin SSI with at bedtime coverage ordered - Goal inpatient blood glucose level is 140-180      DVT prophylaxis: Place TED hose Start: 06/04/22 1517   Code Status:     Code Status: Full Code  Disposition: Awaiting transfer to [facility] Status is: Inpatient Remains inpatient appropriate because: Awaiting for transfer, IV antibiotics   Family Communication: Spoke with the patient at bedside  Consultants:  Podiatry  Procedures:  None  Antimicrobials:  Vancomycin and cefepime  Anti-infectives (From admission, onward)    Start     Dose/Rate Route Frequency Ordered Stop   06/05/22 0400  vancomycin (VANCOCIN) IVPB 1000 mg/200 mL premix        1,000 mg 200 mL/hr over 60 Minutes Intravenous Every 12 hours 06/04/22 1612     06/04/22 2200  ceFEPIme (MAXIPIME) 2 g in sodium chloride 0.9 % 100 mL IVPB        2 g 200 mL/hr over 30 Minutes Intravenous Every 8 hours 06/04/22 1612     06/04/22 1430  vancomycin (VANCOREADY) IVPB 2000 mg/400 mL        2,000 mg 200 mL/hr over 120 Minutes Intravenous  Once 06/04/22 1425 06/04/22 2011   06/04/22 1415  piperacillin-tazobactam (ZOSYN) IVPB 3.375 g        3.375 g 100 mL/hr over 30 Minutes Intravenous  Once 06/04/22 1407 06/04/22 1510        Subjective: Today, patient was  seen and examined at bedside.  Complains of right foot pain.  Objective: Vitals:   06/05/22 0515 06/05/22 0742 06/05/22 0743 06/05/22 1004  BP: 136/68 (!) 148/73 (!) 148/73 (!) 176/71  Pulse: 96 (!) 109 (!) 101   Resp: 20 (!) 26 (!) 22   Temp: 98 F (36.7 C) 99.1 F (37.3 C)    TempSrc: Oral Oral    SpO2: 94% 90%    Weight:      Height:        Intake/Output Summary (Last 24 hours) at 06/05/2022 1100 Last data filed at 06/05/2022 0601 Gross per 24 hour  Intake 1800 ml  Output --  Net 1800 ml   Filed Weights   06/04/22 1114  Weight: 122.5 kg    Physical Examination: Body mass index is 35.62 kg/m.   General: Obese built, not in obvious distress HENT:   No scleral pallor or icterus noted. Oral mucosa is moist.  Chest:  Clear breath sounds.  Diminished breath sounds bilaterally. No crackles or wheezes.  CVS: S1 &S2 heard. No murmur.  Regular rate and rhythm. Abdomen: Soft, nontender, nondistended.  Bowel sounds are heard.   Extremities:  No cyanosis, clubbing with right lower extremity edema, scar from previous surgery noted, peripheral pulses are palpable. Psych: Alert, awake and oriented, normal mood CNS:  No cranial nerve deficits.  Power equal in all extremities.   Skin: Warm and dry.  No rashes noted.  Data Reviewed:   CBC: Recent Labs  Lab 06/04/22 1318 06/05/22 0406  WBC 28.3* 29.0*  NEUTROABS 25.0*  --   HGB 10.1* 10.1*  HCT 33.6* 31.3*  MCV 86.6 81.3  PLT 372 427*    Basic Metabolic Panel: Recent Labs  Lab 06/04/22 1318 06/05/22 0406  NA 137 134*  K 3.1* 3.3*  CL 104 101  CO2 21* 24  GLUCOSE 200* 204*  BUN 11 14  CREATININE 1.14 1.30*  CALCIUM 8.3* 8.4*  MG 1.5*  --   PHOS 2.0*  --     Liver Function Tests: No results for input(s): "AST", "ALT", "ALKPHOS", "BILITOT", "PROT", "ALBUMIN" in the last 168 hours.   Radiology Studies: MR Lumbar Spine W Wo Contrast  Result Date: 06/04/2022 CLINICAL DATA:  History of vertebral osteomyelitis  in August 2023, status post laminectomy EXAM: MRI LUMBAR SPINE WITHOUT AND WITH CONTRAST TECHNIQUE: Multiplanar and multiecho pulse sequences of the lumbar spine were obtained without and with intravenous contrast. CONTRAST:  43m GADAVIST GADOBUTROL 1 MMOL/ML IV SOLN COMPARISON:  No prior MRI available FINDINGS: Evaluation is somewhat limited by motion and by the inability to link the pre and postcontrast sequences. Segmentation: Transitional anatomy at L5, with partial sacralization on the right. The last fully formed disc space is labeled L5-S1 Alignment:  Mild levocurvature.  No significant listhesis. Vertebrae: No acute fracture; the vertebral body heights are preserved. Congenitally short pedicles, which narrow the AP diameter of the spinal canal. Mildly increased T2 signal and contrast enhancement at the posterosuperior aspect of L3 (series 7, image 10 and series 16, image 13). Mildly increased T2 signal in the L2-L3 disc space. Abnormal enhancement is also noted in the left transverse process distally at L3 (series 16, image 21 and series 7, image 19). Prior laminectomy at L2-L3 and L3-L4, with enhancing material in the laminectomy defect (series 17, image 19 and 25). Conus medullaris and cauda equina: Conus extends to the L1 level. Conus and cauda equina appear normal. No definite abnormal enhancement. Paraspinal and other soft tissues: Fluid collection in the superficial soft tissues, extending from the presumed incision line (series 10, image 16) into the subcutaneous fat, the main component of which measures up to 1.6 x 2.7 x 2.5 cm (series 5, image 14 and series 10, image 16). A tract extends anteriorly (series 7, images 11-15) and may reach the posterior aspect thecal sac at L2-L3. There are multiple foci of susceptibility within the collection, which may represent air within the collection (series 10, images 18 and 24, for example). Diffuse enhancement within the para spinous musculature, most focally  at L2-L4. No focal fluid collection within the musculature. Atrophy of the inferior paraspinous muscles. Disc levels: T12-L1: No significant disc bulge. Mild facet arthropathy. No spinal canal stenosis or neural foraminal narrowing. L1-L2: Minimal disc bulge. Mild facet arthropathy. Epidural lipomatosis. Moderate spinal canal stenosis. No neural foraminal narrowing. L2-L3: Disc height loss with mild disc bulge and superimposed central/left paracentral disc protrusion. Moderate facet arthropathy. Moderate to severe spinal canal stenosis. Moderate bilateral neural foraminal narrowing. L3-L4: Mild disc bulge. Moderate facet arthropathy. Moderate to severe spinal canal stenosis. Moderate bilateral neural foraminal narrowing. L4-L5: Mild disc bulge. Moderate facet arthropathy. Narrowing of  the lateral recesses. No spinal canal stenosis. Moderate left and mild right neural foraminal narrowing. L5-S1: Small central disc protrusion. Mild facet arthropathy. No spinal canal stenosis or neural foraminal narrowing. IMPRESSION: 1. Evaluation is somewhat limited by motion and by the inability to link the pre and postcontrast sequences. Within this limitation, enhancing material is noted in the laminectomy defects at L2-L3 and L3-L4, which may represent granulation tissue although infection cannot be excluded. 2. Small fluid collection in the superficial soft tissues, extending from the presumed the incision site through the subcutaneous fat and muscle and likely extending into the posterior aspect of thecal sac at L2-L3. The collection contains multiple foci suspicious for air, and while it could represent a postoperative seroma or hematoma with surrounding inflammatory changes, an infected collection is suspected. 3. Increased T2 signal and enhancement in the posterosuperior aspect of L3, as well as in the distal left transverse process of L3, which is concerning for osteomyelitis. 4. L2-L3 and L3-L4 moderate to severe spinal  canal stenosis and moderate bilateral neural foraminal narrowing. 5. L1-L2 moderate spinal canal stenosis. 6. L4-L5 moderate left and mild right neural foraminal narrowing. Narrowing of the lateral recesses at this level could affect the descending L5 nerve roots. These results will be called to the ordering clinician or representative by the Radiologist Assistant, and communication documented in the PACS or [employer]. Electronically Signed   By: Merilyn Baba M.D.   On: 06/04/2022 21:05   CT FOOT RIGHT W CONTRAST  Result Date: 06/04/2022 CLINICAL DATA:  Osteomyelitis suspected, foot, xray done; Osteomyelitis suspected, ankle, xray done EXAM: CT OF THE LOWER RIGHT FOOT AND ANKLE WITH CONTRAST TECHNIQUE: Multidetector CT imaging of the lower right extremity was performed according to the standard protocol following intravenous contrast administration. RADIATION DOSE REDUCTION: This exam was performed according to the departmental dose-optimization program which includes automated exposure control, adjustment of the mA and/or kV according to patient size and/or use of iterative reconstruction technique. CONTRAST:  149m OMNIPAQUE IOHEXOL 300 MG/ML  SOLN COMPARISON:  X-ray 06/04/2022 FINDINGS: Bones/Joint/Cartilage Postsurgical changes from a previous tibiotalocalcaneal arthrodesis. Circumferential perihardware lucency throughout the distal most screw within the calcaneus and cuboid. Thin perihardware lucency adjacent to the rod within the tibia and calcaneus. No hardware fracture. Fragmentation of the talus with solid tibiotalar arthrodesis along the anteromedial aspect of the joint. There is also fragmentation of the navicular. No bony bridging across the subtalar joints. Pes planovalgus alignment with findings of subfibular impingement. Nonspecific capsular thickening of the ankle, which may be postsurgical and posttraumatic. There are multiple foci of air within the tibiotalar joint space. No evidence  of acute fracture or dislocation. Ligaments Suboptimally assessed by CT. Muscles and Tendons Tibialis anterior tendon is ill-defined at the level of the ankle. Generalized muscle atrophy. Soft tissues Diffuse soft tissue swelling and edema. No organized or rim enhancing fluid collections. IMPRESSION: 1. Diffuse capsular thickening of the ankle joint with several foci of gas in the tibiotalar joint space. Appearance is highly suspicious for septic arthritis. Orthopedic surgery consultation and joint fluid sampling is recommended. 2. Postsurgical changes from a previous tibiotalocalcaneal arthrodesis. Circumferential perihardware lucency throughout the distal most screw within the calcaneus and cuboid, concerning for loosening. 3. Diffuse soft tissue swelling and edema without organized or rim enhancing fluid collections. Electronically Signed   By: NDavina PokeD.O.   On: 06/04/2022 16:13   CT ANKLE RIGHT W CONTRAST  Result Date: 06/04/2022 CLINICAL DATA:  Osteomyelitis suspected, foot, xray done;  Osteomyelitis suspected, ankle, xray done EXAM: CT OF THE LOWER RIGHT FOOT AND ANKLE WITH CONTRAST TECHNIQUE: Multidetector CT imaging of the lower right extremity was performed according to the standard protocol following intravenous contrast administration. RADIATION DOSE REDUCTION: This exam was performed according to the departmental dose-optimization program which includes automated exposure control, adjustment of the mA and/or kV according to patient size and/or use of iterative reconstruction technique. CONTRAST:  129m OMNIPAQUE IOHEXOL 300 MG/ML  SOLN COMPARISON:  X-ray 06/04/2022 FINDINGS: Bones/Joint/Cartilage Postsurgical changes from a previous tibiotalocalcaneal arthrodesis. Circumferential perihardware lucency throughout the distal most screw within the calcaneus and cuboid. Thin perihardware lucency adjacent to the rod within the tibia and calcaneus. No hardware fracture. Fragmentation of the talus  with solid tibiotalar arthrodesis along the anteromedial aspect of the joint. There is also fragmentation of the navicular. No bony bridging across the subtalar joints. Pes planovalgus alignment with findings of subfibular impingement. Nonspecific capsular thickening of the ankle, which may be postsurgical and posttraumatic. There are multiple foci of air within the tibiotalar joint space. No evidence of acute fracture or dislocation. Ligaments Suboptimally assessed by CT. Muscles and Tendons Tibialis anterior tendon is ill-defined at the level of the ankle. Generalized muscle atrophy. Soft tissues Diffuse soft tissue swelling and edema. No organized or rim enhancing fluid collections. IMPRESSION: 1. Diffuse capsular thickening of the ankle joint with several foci of gas in the tibiotalar joint space. Appearance is highly suspicious for septic arthritis. Orthopedic surgery consultation and joint fluid sampling is recommended. 2. Postsurgical changes from a previous tibiotalocalcaneal arthrodesis. Circumferential perihardware lucency throughout the distal most screw within the calcaneus and cuboid, concerning for loosening. 3. Diffuse soft tissue swelling and edema without organized or rim enhancing fluid collections. Electronically Signed   By: NDavina PokeD.O.   On: 06/04/2022 16:13   DG Foot Complete Right  Result Date: 06/04/2022 CLINICAL DATA:  Foot pain.  Surgery in 2018.  Unable to bear weight. EXAM: RIGHT FOOT COMPLETE - 3+ VIEW COMPARISON:  None Available. FINDINGS: Diffuse marked soft tissue swelling. Postsurgical changes in the hindfoot with fusion. There is lucency surrounding the screw traversing the calcaneus and cuboid. There is lucency surrounding the superior most screw. No acute fractures. No soft tissue gas. No other acute abnormalities. No bony erosion identified to suggest osteomyelitis. IMPRESSION: 1. Marked soft tissue swelling around the foot. 2. The patient is status post fusion of  the hindfoot. There is lucency surrounding 2 screws which can be seen with loosening or infection. The chronicity of the lucency cannot be determined as there are no comparison films. 3. No acute fracture.  No bony erosion to suggest osteomyelitis. Electronically Signed   By: DDorise BullionIII M.D.   On: 06/04/2022 11:55      LOS: 1 day   LFlora Lipps MD [facility] Available via Epic secure chat 7am-7pm After these hours, please refer to coverage provider listed on [email or web address] 06/05/2022, 11:00 AM

## 2022-06-05 NOTE — ED Notes (Signed)
The pt has removed his cardiac leads, the pt states " I hate having all of them wires on me I can't get comfortable or sleep." This RN, educated the pt on the need for cardiac monitoring, the pt has refused to have the cardiac leads replaced, the pt is agreeable to have the O2 sensor placed on his finger while attempting to rest.

## 2022-06-05 NOTE — ED Notes (Signed)
Spoke  with  Claiborne Billings at  [facility]  pt  still on  waitlist  for bed  informed  Burgin  and  Dr.  Flora Lipps  MD

## 2022-06-05 NOTE — ED Notes (Signed)
The pt has a hx sleep apnea, the pt states " I have sleep apnea, but I don't wear a C-pap at home. I don't want to wear one here either." The pt has been educated on the need for supplemental oxygen due to his desaturation while sleeping. The pt is agreeable with a n/c being placed at this time. The pt has been placed on 2L n/c, he is in no acute visible distress close monitoring continued.

## 2022-06-05 NOTE — ED Notes (Signed)
The pt has removed all monitoring at this time, the pt states " I can not get comfortable with this stuff on me." The pt is in no acute visible distress at this time, the pt has been given a cup of ice water to drink. Close monitoring continued.

## 2022-06-05 NOTE — Consult Note (Signed)
Reason for Consult: Right ankle, foot cellulitis, osteomyelitis Referring Physician: Dr. Ander Purpura, MD  Rickey Glover. is an 61 y.o. male.  HPI: 61 year old male with history of morbid obesity, hypertension, hyperlipidemia, non-insulin-dependent diabetes mellitus, history of spinal laminectomy in August 2023 with recent history of vertebral osteomyelitis and discitis with MSSA bacteremia in August 2023, completed appropriate antibiotic therapy treatment with [facility] infectious disease department, CAD on aspirin and Plavix, CVA, history of right ankle and tibia fixation hardware to the hospital with right foot pain and swelling.  Patient states that started after hip surgery and then he got infection in his foot and also his back.  He has had chronic swelling present to the right ankle he states is not that much more swollen but more painful than normal.  No open wounds that he reports.  Past Medical History:  Diagnosis Date   Chronic back pain    DDD (degenerative disc disease), lumbar    Diabetes mellitus without complication (HCC)    Hip pain    Hyperlipidemia    Hypertension    Insomnia    Knee pain, left    LAP-BAND surgery status    Sciatica of right side    Sleep apnea    new DX - has not been for CPAP titration yet   Vitamin D deficiency     Past Surgical History:  Procedure Laterality Date   COLONOSCOPY WITH PROPOFOL N/A 07/15/2015   Procedure: COLONOSCOPY WITH PROPOFOL;  Surgeon: Lucilla Lame, MD;  Location: [place];  Service: Endoscopy;  Laterality: N/A;  Diabetic - oral meds Sleep apnea   JOINT REPLACEMENT Right 2016   lap band surgery     LUMBAR SPINE SURGERY  April 2017    Family History  Problem Relation Age of Onset   Aneurysm Mother        Brain   Stroke Maternal Grandmother    Stroke Paternal Grandmother     Social History:  reports that he has been smoking cigarettes. He has a 10.00 pack-year smoking history. He has never used smokeless  tobacco. He reports that he does not drink alcohol and does not use drugs.  Allergies:  Allergies  Allergen Reactions   Percocet [Oxycodone-Acetaminophen]     Medications: I have reviewed the patient's current medications.  Results for orders placed or performed during the hospital encounter of 06/04/22 (from the past 48 hour(s))  Basic metabolic panel     Status: Abnormal   Collection Time: 06/04/22  1:18 PM  Result Value Ref Range   Sodium 137 135 - 145 mmol/L   Potassium 3.1 (L) 3.5 - 5.1 mmol/L   Chloride 104 98 - 111 mmol/L   CO2 21 (L) 22 - 32 mmol/L   Glucose, Bld 200 (H) 70 - 99 mg/dL    Comment: Glucose reference range applies only to samples taken after fasting for at least 8 hours.   BUN 11 6 - 20 mg/dL   Creatinine, Ser 1.14 0.61 - 1.24 mg/dL   Calcium 8.3 (L) 8.9 - 10.3 mg/dL   GFR, Estimated >60 >60 mL/min    Comment: (NOTE) Calculated using the CKD-EPI Creatinine Equation (2021)    Anion gap 12 5 - 15    Comment: Performed at [facility], [address] [place], [place] [address]  CBC with Differential     Status: Abnormal   Collection Time: 06/04/22  1:18 PM  Result Value Ref Range   WBC 28.3 (H) 4.0 - 10.5  K/uL   RBC 3.88 (L) 4.22 - 5.81 MIL/uL   Hemoglobin 10.1 (L) 13.0 - 17.0 g/dL   HCT 33.6 (L) 39.0 - 52.0 %   MCV 86.6 80.0 - 100.0 fL   MCH 26.0 26.0 - 34.0 pg   MCHC 30.1 30.0 - 36.0 g/dL   RDW 15.4 11.5 - 15.5 %   Platelets 372 150 - 400 K/uL   nRBC 0.0 0.0 - 0.2 %   Neutrophils Relative % 89 %   Neutro Abs 25.0 (H) 1.7 - 7.7 K/uL   Lymphocytes Relative 4 %   Lymphs Abs 1.2 0.7 - 4.0 K/uL   Monocytes Relative 6 %   Monocytes Absolute 1.8 (H) 0.1 - 1.0 K/uL   Eosinophils Relative 0 %   Eosinophils Absolute 0.0 0.0 - 0.5 K/uL   Basophils Relative 0 %   Basophils Absolute 0.0 0.0 - 0.1 K/uL   WBC Morphology MORPHOLOGY UNREMARKABLE    Smear Review Normal platelet morphology    Immature Granulocytes 1 %   Abs Immature Granulocytes  0.21 (H) 0.00 - 0.07 K/uL   Polychromasia PRESENT     Comment: Performed at [facility], [address] [place], [place] [address]  Lactic acid, plasma     Status: None   Collection Time: 06/04/22  1:18 PM  Result Value Ref Range   Lactic Acid, Venous 1.6 0.5 - 1.9 mmol/L    Comment: Performed at [facility], [address] [place], [place] [address]  Phosphorus     Status: Abnormal   Collection Time: 06/04/22  1:18 PM  Result Value Ref Range   Phosphorus 2.0 (L) 2.5 - 4.6 mg/dL    Comment: Performed at [facility], [address], [place], [place] [address]  Magnesium     Status: Abnormal   Collection Time: 06/04/22  1:18 PM  Result Value Ref Range   Magnesium 1.5 (L) 1.7 - 2.4 mg/dL    Comment: Performed at [facility], [address] [place], [place] [address]  Blood culture (routine x 2)     Status: None (Preliminary result)   Collection Time: 06/04/22  2:27 PM   Specimen: BLOOD LEFT ARM  Result Value Ref Range   Specimen Description BLOOD LEFT ARM    Special Requests      BOTTLES DRAWN AEROBIC AND ANAEROBIC Blood Culture adequate volume   Culture      NO GROWTH < 24 HOURS Performed at [facility], [address] [place], [place] [address]    Report Status PENDING   Blood culture (routine x 2)     Status: None (Preliminary result)   Collection Time: 06/04/22  2:32 PM   Specimen: Right Antecubital; Blood  Result Value Ref Range   Specimen Description RIGHT ANTECUBITAL    Special Requests      BOTTLES DRAWN AEROBIC AND ANAEROBIC Blood Culture adequate volume   Culture      NO GROWTH < 24 HOURS Performed at [facility], [address] [place], [place] [address]    Report Status PENDING   Lactic acid, plasma     Status: None   Collection Time: 06/04/22  2:52 PM  Result Value Ref Range   Lactic Acid, Venous 1.5 0.5 - 1.9 mmol/L    Comment: Performed at [facility], [address]  [place], [place] [address]  Urinalysis, Routine w reflex microscopic Urine, Unspecified Source     Status: Abnormal   Collection Time: 06/04/22  4:58 PM  Result Value Ref Range   Color, Urine YELLOW (A) YELLOW   APPearance CLOUDY (A) CLEAR   Specific Gravity, Urine >1.046 (H) 1.005 - 1.030   pH 5.0 5.0 - 8.0   Glucose, UA NEGATIVE NEGATIVE mg/dL   Hgb urine dipstick NEGATIVE NEGATIVE   Bilirubin Urine NEGATIVE NEGATIVE   Ketones, ur NEGATIVE NEGATIVE mg/dL   Protein, ur >=300 (A) NEGATIVE mg/dL   Nitrite NEGATIVE NEGATIVE   Leukocytes,Ua NEGATIVE NEGATIVE   RBC / HPF 0-5 0 - 5 RBC/hpf   WBC, UA 0-5 0 - 5 WBC/hpf   Bacteria, UA NONE SEEN NONE SEEN   Squamous Epithelial / LPF NONE SEEN 0 - 5    Comment: Performed at [facility], [address] [place], [place] [address]  CBG monitoring, ED     Status: Abnormal   Collection Time: 06/04/22  5:26 PM  Result Value Ref Range   Glucose-Capillary 240 (H) 70 - 99 mg/dL    Comment: Glucose reference range applies only to samples taken after fasting for at least 8 hours.  CBG monitoring, ED     Status: Abnormal   Collection Time: 06/04/22  9:39 PM  Result Value Ref Range   Glucose-Capillary 155 (H) 70 - 99 mg/dL    Comment: Glucose reference range applies only to samples taken after fasting for at least 8 hours.  Basic metabolic panel     Status: Abnormal   Collection Time: 06/05/22  4:06 AM  Result Value Ref Range   Sodium 134 (L) 135 - 145 mmol/L   Potassium 3.3 (L) 3.5 - 5.1 mmol/L   Chloride 101 98 - 111 mmol/L   CO2 24 22 - 32 mmol/L   Glucose, Bld 204 (H) 70 - 99 mg/dL    Comment: Glucose reference range applies only to samples taken after fasting for at least 8 hours.   BUN 14 6 - 20 mg/dL   Creatinine, Ser 1.30 (H) 0.61 - 1.24 mg/dL   Calcium 8.4 (L) 8.9 - 10.3 mg/dL   GFR, Estimated >60 >60 mL/min    Comment: (NOTE) Calculated using the CKD-EPI Creatinine Equation (2021)    Anion gap 9 5 - 15    Comment: Performed at  [facility], [address] [place], [place] [address]  CBC     Status: Abnormal   Collection Time: 06/05/22  4:06 AM  Result Value Ref Range   WBC 29.0 (H) 4.0 - 10.5 K/uL   RBC 3.85 (L) 4.22 - 5.81 MIL/uL   Hemoglobin 10.1 (L) 13.0 - 17.0 g/dL   HCT 31.3 (L) 39.0 - 52.0 %   MCV 81.3 80.0 - 100.0 fL   MCH 26.2 26.0 - 34.0 pg   MCHC 32.3 30.0 - 36.0 g/dL   RDW 15.5 11.5 - 15.5 %   Platelets 427 (H) 150 - 400 K/uL   nRBC 0.0 0.0 - 0.2 %    Comment: Performed at [facility], [address] [place], [place] [address]  HIV Antibody (routine testing w rflx)     Status: None   Collection Time: 06/05/22  4:06 AM  Result Value Ref Range   HIV Screen 4th Generation wRfx Non Reactive Non Reactive    Comment: Performed at [facility], [place] [address] [place], [place] [address]  Sedimentation rate     Status: Abnormal   Collection Time: 06/05/22  4:06 AM  Result Value Ref Range   Sed Rate 65 (H)  0 - 20 mm/hr    Comment: Performed at [facility], [address] [place], [place] [address]  CBG monitoring, ED     Status: Abnormal   Collection Time: 06/05/22  7:57 AM  Result Value Ref Range   Glucose-Capillary 185 (H) 70 - 99 mg/dL    Comment: Glucose reference range applies only to samples taken after fasting for at least 8 hours.  CBG monitoring, ED     Status: Abnormal   Collection Time: 06/05/22 12:14 PM  Result Value Ref Range   Glucose-Capillary 163 (H) 70 - 99 mg/dL    Comment: Glucose reference range applies only to samples taken after fasting for at least 8 hours.  CBG monitoring, ED     Status: Abnormal   Collection Time: 06/05/22  4:29 PM  Result Value Ref Range   Glucose-Capillary 190 (H) 70 - 99 mg/dL    Comment: Glucose reference range applies only to samples taken after fasting for at least 8 hours.  CBC     Status: Abnormal   Collection Time: 06/05/22  5:54 PM  Result Value Ref Range   WBC 25.0 (H) 4.0 - 10.5 K/uL   RBC 3.89 (L)  4.22 - 5.81 MIL/uL   Hemoglobin 10.0 (L) 13.0 - 17.0 g/dL   HCT 31.1 (L) 39.0 - 52.0 %   MCV 79.9 (L) 80.0 - 100.0 fL   MCH 25.7 (L) 26.0 - 34.0 pg   MCHC 32.2 30.0 - 36.0 g/dL   RDW 15.3 11.5 - 15.5 %   Platelets 440 (H) 150 - 400 K/uL   nRBC 0.0 0.0 - 0.2 %    Comment: Performed at [facility], [address] [place], [place] [address]    MR Lumbar Spine W Wo Contrast  Result Date: 06/04/2022 CLINICAL DATA:  History of vertebral osteomyelitis in August 2023, status post laminectomy EXAM: MRI LUMBAR SPINE WITHOUT AND WITH CONTRAST TECHNIQUE: Multiplanar and multiecho pulse sequences of the lumbar spine were obtained without and with intravenous contrast. CONTRAST:  11m GADAVIST GADOBUTROL 1 MMOL/ML IV SOLN COMPARISON:  No prior MRI available FINDINGS: Evaluation is somewhat limited by motion and by the inability to link the pre and postcontrast sequences. Segmentation: Transitional anatomy at L5, with partial sacralization on the right. The last fully formed disc space is labeled L5-S1 Alignment:  Mild levocurvature.  No significant listhesis. Vertebrae: No acute fracture; the vertebral body heights are preserved. Congenitally short pedicles, which narrow the AP diameter of the spinal canal. Mildly increased T2 signal and contrast enhancement at the posterosuperior aspect of L3 (series 7, image 10 and series 16, image 13). Mildly increased T2 signal in the L2-L3 disc space. Abnormal enhancement is also noted in the left transverse process distally at L3 (series 16, image 21 and series 7, image 19). Prior laminectomy at L2-L3 and L3-L4, with enhancing material in the laminectomy defect (series 17, image 19 and 25). Conus medullaris and cauda equina: Conus extends to the L1 level. Conus and cauda equina appear normal. No definite abnormal enhancement. Paraspinal and other soft tissues: Fluid collection in the superficial soft tissues, extending from the presumed incision line (series 10,  image 16) into the subcutaneous fat, the main component of which measures up to 1.6 x 2.7 x 2.5 cm (series 5, image 14 and series 10, image 16). A tract extends anteriorly (series 7, images 11-15) and may reach the posterior aspect thecal sac at L2-L3. There are multiple foci of susceptibility within the  collection, which may represent air within the collection (series 10, images 18 and 24, for example). Diffuse enhancement within the para spinous musculature, most focally at L2-L4. No focal fluid collection within the musculature. Atrophy of the inferior paraspinous muscles. Disc levels: T12-L1: No significant disc bulge. Mild facet arthropathy. No spinal canal stenosis or neural foraminal narrowing. L1-L2: Minimal disc bulge. Mild facet arthropathy. Epidural lipomatosis. Moderate spinal canal stenosis. No neural foraminal narrowing. L2-L3: Disc height loss with mild disc bulge and superimposed central/left paracentral disc protrusion. Moderate facet arthropathy. Moderate to severe spinal canal stenosis. Moderate bilateral neural foraminal narrowing. L3-L4: Mild disc bulge. Moderate facet arthropathy. Moderate to severe spinal canal stenosis. Moderate bilateral neural foraminal narrowing. L4-L5: Mild disc bulge. Moderate facet arthropathy. Narrowing of the lateral recesses. No spinal canal stenosis. Moderate left and mild right neural foraminal narrowing. L5-S1: Small central disc protrusion. Mild facet arthropathy. No spinal canal stenosis or neural foraminal narrowing. IMPRESSION: 1. Evaluation is somewhat limited by motion and by the inability to link the pre and postcontrast sequences. Within this limitation, enhancing material is noted in the laminectomy defects at L2-L3 and L3-L4, which may represent granulation tissue although infection cannot be excluded. 2. Small fluid collection in the superficial soft tissues, extending from the presumed the incision site through the subcutaneous fat and muscle and  likely extending into the posterior aspect of thecal sac at L2-L3. The collection contains multiple foci suspicious for air, and while it could represent a postoperative seroma or hematoma with surrounding inflammatory changes, an infected collection is suspected. 3. Increased T2 signal and enhancement in the posterosuperior aspect of L3, as well as in the distal left transverse process of L3, which is concerning for osteomyelitis. 4. L2-L3 and L3-L4 moderate to severe spinal canal stenosis and moderate bilateral neural foraminal narrowing. 5. L1-L2 moderate spinal canal stenosis. 6. L4-L5 moderate left and mild right neural foraminal narrowing. Narrowing of the lateral recesses at this level could affect the descending L5 nerve roots. These results will be called to the ordering clinician or representative by the Radiologist Assistant, and communication documented in the PACS or [employer]. Electronically Signed   By: Merilyn Baba M.D.   On: 06/04/2022 21:05   CT FOOT RIGHT W CONTRAST  Result Date: 06/04/2022 CLINICAL DATA:  Osteomyelitis suspected, foot, xray done; Osteomyelitis suspected, ankle, xray done EXAM: CT OF THE LOWER RIGHT FOOT AND ANKLE WITH CONTRAST TECHNIQUE: Multidetector CT imaging of the lower right extremity was performed according to the standard protocol following intravenous contrast administration. RADIATION DOSE REDUCTION: This exam was performed according to the departmental dose-optimization program which includes automated exposure control, adjustment of the mA and/or kV according to patient size and/or use of iterative reconstruction technique. CONTRAST:  145m OMNIPAQUE IOHEXOL 300 MG/ML  SOLN COMPARISON:  X-ray 06/04/2022 FINDINGS: Bones/Joint/Cartilage Postsurgical changes from a previous tibiotalocalcaneal arthrodesis. Circumferential perihardware lucency throughout the distal most screw within the calcaneus and cuboid. Thin perihardware lucency adjacent to the rod within  the tibia and calcaneus. No hardware fracture. Fragmentation of the talus with solid tibiotalar arthrodesis along the anteromedial aspect of the joint. There is also fragmentation of the navicular. No bony bridging across the subtalar joints. Pes planovalgus alignment with findings of subfibular impingement. Nonspecific capsular thickening of the ankle, which may be postsurgical and posttraumatic. There are multiple foci of air within the tibiotalar joint space. No evidence of acute fracture or dislocation. Ligaments Suboptimally assessed by CT. Muscles and Tendons Tibialis anterior tendon is ill-defined  at the level of the ankle. Generalized muscle atrophy. Soft tissues Diffuse soft tissue swelling and edema. No organized or rim enhancing fluid collections. IMPRESSION: 1. Diffuse capsular thickening of the ankle joint with several foci of gas in the tibiotalar joint space. Appearance is highly suspicious for septic arthritis. Orthopedic surgery consultation and joint fluid sampling is recommended. 2. Postsurgical changes from a previous tibiotalocalcaneal arthrodesis. Circumferential perihardware lucency throughout the distal most screw within the calcaneus and cuboid, concerning for loosening. 3. Diffuse soft tissue swelling and edema without organized or rim enhancing fluid collections. Electronically Signed   By: Davina Poke D.O.   On: 06/04/2022 16:13   CT ANKLE RIGHT W CONTRAST  Result Date: 06/04/2022 CLINICAL DATA:  Osteomyelitis suspected, foot, xray done; Osteomyelitis suspected, ankle, xray done EXAM: CT OF THE LOWER RIGHT FOOT AND ANKLE WITH CONTRAST TECHNIQUE: Multidetector CT imaging of the lower right extremity was performed according to the standard protocol following intravenous contrast administration. RADIATION DOSE REDUCTION: This exam was performed according to the departmental dose-optimization program which includes automated exposure control, adjustment of the mA and/or kV according  to patient size and/or use of iterative reconstruction technique. CONTRAST:  198m OMNIPAQUE IOHEXOL 300 MG/ML  SOLN COMPARISON:  X-ray 06/04/2022 FINDINGS: Bones/Joint/Cartilage Postsurgical changes from a previous tibiotalocalcaneal arthrodesis. Circumferential perihardware lucency throughout the distal most screw within the calcaneus and cuboid. Thin perihardware lucency adjacent to the rod within the tibia and calcaneus. No hardware fracture. Fragmentation of the talus with solid tibiotalar arthrodesis along the anteromedial aspect of the joint. There is also fragmentation of the navicular. No bony bridging across the subtalar joints. Pes planovalgus alignment with findings of subfibular impingement. Nonspecific capsular thickening of the ankle, which may be postsurgical and posttraumatic. There are multiple foci of air within the tibiotalar joint space. No evidence of acute fracture or dislocation. Ligaments Suboptimally assessed by CT. Muscles and Tendons Tibialis anterior tendon is ill-defined at the level of the ankle. Generalized muscle atrophy. Soft tissues Diffuse soft tissue swelling and edema. No organized or rim enhancing fluid collections. IMPRESSION: 1. Diffuse capsular thickening of the ankle joint with several foci of gas in the tibiotalar joint space. Appearance is highly suspicious for septic arthritis. Orthopedic surgery consultation and joint fluid sampling is recommended. 2. Postsurgical changes from a previous tibiotalocalcaneal arthrodesis. Circumferential perihardware lucency throughout the distal most screw within the calcaneus and cuboid, concerning for loosening. 3. Diffuse soft tissue swelling and edema without organized or rim enhancing fluid collections. Electronically Signed   By: NDavina PokeD.O.   On: 06/04/2022 16:13   DG Foot Complete Right  Result Date: 06/04/2022 CLINICAL DATA:  Foot pain.  Surgery in 2018.  Unable to bear weight. EXAM: RIGHT FOOT COMPLETE - 3+ VIEW  COMPARISON:  None Available. FINDINGS: Diffuse marked soft tissue swelling. Postsurgical changes in the hindfoot with fusion. There is lucency surrounding the screw traversing the calcaneus and cuboid. There is lucency surrounding the superior most screw. No acute fractures. No soft tissue gas. No other acute abnormalities. No bony erosion identified to suggest osteomyelitis. IMPRESSION: 1. Marked soft tissue swelling around the foot. 2. The patient is status post fusion of the hindfoot. There is lucency surrounding 2 screws which can be seen with loosening or infection. The chronicity of the lucency cannot be determined as there are no comparison films. 3. No acute fracture.  No bony erosion to suggest osteomyelitis. Electronically Signed   By: DDorise BullionIII M.D.   On: 06/04/2022  11:55    Review of Systems Blood pressure (!) 152/76, pulse (!) 107, temperature 99.7 F (37.6 C), temperature source Oral, resp. rate 16, height '6\' 1"'$  (1.854 m), weight 122.5 kg, SpO2 97 %. Physical Exam General: AAO x3, NAD  Dermatological: Chronic edema present to right ankle but does appear to be somewhat increased compared to baseline.  There is erythema mostly along the medial aspect inferior to the ankle.  There is no areas of fluctuance or crepitation.  Graft site noted to the anterior ankle from prior surgery.  No open wound.       Vascular: Dorsalis Pedis artery and Posterior Tibial artery pedal pulses are palpable bilateral with immedate capillary fill time. There is no pain with calf compression, swelling, warmth, erythema.   Neruologic: Grossly intact via light touch bilateral.   Musculoskeletal: Tenderness to the medial aspect of the foot on the area of erythema.  Ankle joint fusion.   Assessment/Plan: Concern for septic joint right side, osteomyelitis  Podiatry was originally consulted but given the extent of the infection and multiple infections and other areas we had recommended the patient  to be transferred to [facility]  He has been accepted and awaiting transfer.  I discussed the CT scan with the patient.  Discussed that he is likely getting need to have surgery.  We discussed joint aspiration today however hopefully he can be transferred fairly soon to do for evaluation and treatment.  I discussed with the patient he is getting need to have surgical intervention and he is at high risk of below-knee amputation.  Patient wants to defer treatment till he gets to [facility].  For now continue broad-spectrum antibiotics.  Monitor vitals, labs while here.   Trula Slade 06/05/2022, 8:02 PM

## 2022-06-06 DIAGNOSIS — I10 Essential (primary) hypertension: Secondary | ICD-10-CM | POA: Diagnosis not present

## 2022-06-06 DIAGNOSIS — L089 Local infection of the skin and subcutaneous tissue, unspecified: Secondary | ICD-10-CM | POA: Diagnosis not present

## 2022-06-06 DIAGNOSIS — G4733 Obstructive sleep apnea (adult) (pediatric): Secondary | ICD-10-CM | POA: Diagnosis not present

## 2022-06-06 DIAGNOSIS — E782 Mixed hyperlipidemia: Secondary | ICD-10-CM | POA: Diagnosis not present

## 2022-06-06 LAB — BLOOD CULTURE ID PANEL (REFLEXED) - BCID2

## 2022-06-06 LAB — BASIC METABOLIC PANEL
Anion gap: 8 (ref 5–15)
BUN: 15 mg/dL (ref 6–20)
CO2: 22 mmol/L (ref 22–32)
Calcium: 8.7 mg/dL — ABNORMAL LOW (ref 8.9–10.3)
Chloride: 106 mmol/L (ref 98–111)
Creatinine, Ser: 1.23 mg/dL (ref 0.61–1.24)
GFR, Estimated: >60 mL/min (ref >60)
Glucose, Bld: 154 mg/dL — ABNORMAL HIGH (ref 70–99)
Potassium: 3.4 mmol/L — ABNORMAL LOW (ref 3.5–5.1)
Sodium: 136 mmol/L (ref 135–145)

## 2022-06-06 LAB — CBG MONITORING, ED
Glucose-Capillary: 159 mg/dL — ABNORMAL HIGH (ref 70–99)
Glucose-Capillary: 272 mg/dL — ABNORMAL HIGH (ref 70–99)
Glucose-Capillary: 217 mg/dL — ABNORMAL HIGH (ref 70–99)

## 2022-06-06 LAB — MAGNESIUM: Magnesium: 1.8 mg/dL (ref 1.7–2.4)

## 2022-06-06 MED ORDER — POTASSIUM CHLORIDE CRYS ER 20 MEQ PO TBCR
40.0000 meq | EXTENDED_RELEASE_TABLET | Freq: Once | ORAL | Status: AC
  Administered 2022-06-06: 40 meq via ORAL
  Filled 2022-06-06: qty 2

## 2022-06-06 MED ORDER — HEPARIN SODIUM (PORCINE) 5000 UNIT/ML IJ SOLN
5000.0000 [IU] | Freq: Three times a day (TID) | INTRAMUSCULAR | Status: DC
  Administered 2022-06-06: 5000 [IU] via SUBCUTANEOUS
  Filled 2022-06-06: qty 1

## 2022-06-06 MED ORDER — POLYETHYLENE GLYCOL 3350 17 G PO PACK: 17.0000 g | PACK | Freq: Every day | ORAL | Status: DC

## 2022-06-06 NOTE — Progress Notes (Signed)
PHARMACY - PHYSICIAN COMMUNICATION CRITICAL VALUE ALERT - BLOOD CULTURE IDENTIFICATION (BCID)  Rickey Glover. is an 61 y.o. male who presented to [facility] on 06/04/2022 with a chief complaint of R foot pain and swelling  Assessment:  1/4, aerobic bottle, MSSA (suspected source: ankle and tibia fixation hardware)  Name of physician (or Provider) Contacted: Neomia Glass  Current antibiotics: Cefepime, Vancomycin  Changes to prescribed antibiotics recommended:  Pt recently completed cefazolin OPAT from 02/19/22-04/03/22 for MSSA bacteremia 2/2 vertebral osteomyelitis/discitis. This time the source for MSSA bacteremia is believed to be foot fixation hardware. Recommended cefazolin 2 g IV q8H  Results for orders placed or performed during the hospital encounter of 06/04/22  Blood Culture ID Panel (Reflexed) (Collected: 06/04/2022  2:32 PM)  Result Value Ref Range   Enterococcus faecalis NOT DETECTED NOT DETECTED   Enterococcus Faecium NOT DETECTED NOT DETECTED   Listeria monocytogenes NOT DETECTED NOT DETECTED   Staphylococcus species DETECTED (A) NOT DETECTED   Staphylococcus aureus (BCID) DETECTED (A) NOT DETECTED   Staphylococcus epidermidis NOT DETECTED NOT DETECTED   Staphylococcus lugdunensis NOT DETECTED NOT DETECTED   Streptococcus species NOT DETECTED NOT DETECTED   Streptococcus agalactiae NOT DETECTED NOT DETECTED   Streptococcus pneumoniae NOT DETECTED NOT DETECTED   Streptococcus pyogenes NOT DETECTED NOT DETECTED   A.calcoaceticus-baumannii NOT DETECTED NOT DETECTED   Bacteroides fragilis NOT DETECTED NOT DETECTED   Enterobacterales NOT DETECTED NOT DETECTED   Enterobacter cloacae complex NOT DETECTED NOT DETECTED   Escherichia coli NOT DETECTED NOT DETECTED   Klebsiella aerogenes NOT DETECTED NOT DETECTED   Klebsiella oxytoca NOT DETECTED NOT DETECTED   Klebsiella pneumoniae NOT DETECTED NOT DETECTED   Proteus species NOT DETECTED NOT DETECTED   Salmonella  species NOT DETECTED NOT DETECTED   Serratia marcescens NOT DETECTED NOT DETECTED   Haemophilus influenzae NOT DETECTED NOT DETECTED   Neisseria meningitidis NOT DETECTED NOT DETECTED   Pseudomonas aeruginosa NOT DETECTED NOT DETECTED   Stenotrophomonas maltophilia NOT DETECTED NOT DETECTED   Candida albicans NOT DETECTED NOT DETECTED   Candida auris NOT DETECTED NOT DETECTED   Candida glabrata NOT DETECTED NOT DETECTED   Candida krusei NOT DETECTED NOT DETECTED   Candida parapsilosis NOT DETECTED NOT DETECTED   Candida tropicalis NOT DETECTED NOT DETECTED   Cryptococcus neoformans/gattii NOT DETECTED NOT DETECTED   Meth resistant mecA/C and MREJ NOT DETECTED NOT DETECTED    Delena Bali 06/06/2022  8:51 PM

## 2022-06-06 NOTE — ED Provider Notes (Signed)
Vitals:   06/06/22 1718 06/06/22 2004  BP: 138/69 120/67  Pulse: (!) 102 94  Resp: 20 16  Temp: 98.2 F (36.8 C) 97.7 F (36.5 C)  SpO2: 96% 92%     Patient alert well-oriented.  [facility] team at the bedside.  Patient understanding very agreeable and pleased to be transferring to [facility].  He is in no distress and appears appropriate for transfer via [facility] transport team for further care and treatment at [facility].     Delman Kitten, MD 06/06/22 2020

## 2022-06-06 NOTE — Progress Notes (Signed)
PROGRESS NOTE    Rickey Glover.  ZWC:[id number] DOB: 03-31-1961 DOA: 06/04/2022 PCP: Alford Highland, RN    Brief Narrative:  [name] is a 61 year old male with history of morbid obesity, hypertension, hyperlipidemia, non-insulin-dependent diabetes mellitus, history of spinal laminectomy in August 2023 with recent history of vertebral osteomyelitis and discitis with MSSA bacteremia in August 2023, completed appropriate antibiotic therapy treatment with [facility] infectious disease department,  CAD on aspirin and Plavix, CVA, history of right ankle and tibia fixation hardware to the hospital with right foot pain and swelling.  In the ED, patient was mildly tachycardic.  Initial labs showed potassium of 3.1 with a creatinine of 1.1.  WBC was elevated at 28.3.  Lactic acid was 1.6.  ED provider spoke with podiatry Dr. Rhea Belton and CT the foot with and without contrast was performed due to underlying hardware which  had signs of hardware infection.  Plan is to transfer back to [facility] but at this time patient has been accepted at [facility] pending bed availability.  Patient still does not have a bed today.  Assessment and plan    Right foot infection/Right foot pain secondary to hardware infection. Continue vancomycin, cefepime while in the ED.  Plan for transfer to [facility], pending bed availability.  I again called the [facility] transfer center but bed is not available.  Patient has been accepted though.  Patient feels a little better with pain today after changing to Dilaudid.  History of MSSA bacteremia - Status post IV cefazolin and completion of oral antibiotic and per [facility] infectious disease note, cultures were negative on 02/20/2022.  Repeat blood cultures sent 08/05/2021 negative in 2 days.   Hypokalemia Mild.  Potassium 3.4.  Will continue to replenish orally.  Check levels in AM.  Magnesium of 1.8.  Hypomagnesemia.  Will replace with 2 g of IV magnesium sulfate  today.  Morbid (severe) obesity due to excess calories (HCC) Body mass index is 35.62 kg/m.  Would benefit from weight loss as outpatient.   OSA (obstructive sleep apnea) Continue CPAP at nighttime   Tobacco abuse Continue nicotine patch   Insomnia On as needed melatonin   Leukocytosis Likely secondary to foot infection.  Latest WBC at 28 K.  Check CBC in AM.   Hyperlipidemia Continue atorvastatin    Essential hypertension Continue amlodipine 5, irbesartan 150 mg daily, spironolactone 25 mg daily   Type 2 diabetes mellitus with hyperglycemia, without long-term current use of insulin (HCC) On sliding scale.  On glipizide and metformin at home.     DVT prophylaxis: heparin injection 5,000 Units Start: 06/06/22 1400 Place TED hose Start: 06/04/22 1517   Code Status:     Code Status: Full Code  Disposition: Awaiting transfer to [facility]  Status is: Inpatient Remains inpatient appropriate because: Awaiting for transfer, IV antibiotics, IV narcotics   Family Communication: Spoke with the patient at bedside  Consultants:  Podiatry  Procedures:  None  Antimicrobials:  Vancomycin and cefepime IV  Anti-infectives (From admission, onward)    Start     Dose/Rate Route Frequency Ordered Stop   06/05/22 0400  vancomycin (VANCOCIN) IVPB 1000 mg/200 mL premix        1,000 mg 200 mL/hr over 60 Minutes Intravenous Every 12 hours 06/04/22 1612     06/04/22 2200  ceFEPIme (MAXIPIME) 2 g in sodium chloride 0.9 % 100 mL IVPB        2 g 200 mL/hr over 30 Minutes  Intravenous Every 8 hours 06/04/22 1612     06/04/22 1430  vancomycin (VANCOREADY) IVPB 2000 mg/400 mL        2,000 mg 200 mL/hr over 120 Minutes Intravenous  Once 06/04/22 1425 06/04/22 2011   06/04/22 1415  piperacillin-tazobactam (ZOSYN) IVPB 3.375 g        3.375 g 100 mL/hr over 30 Minutes Intravenous  Once 06/04/22 1407 06/04/22 1510       Subjective: Today, patient was seen and examined at  bedside.  States that his foot pain is better with the current medication regimen.  Denies any nausea vomiting shortness of breath chest pain fever or chills.  Has not had a bowel movement.  Objective: Vitals:   06/05/22 1855 06/05/22 2228 06/06/22 0626 06/06/22 0822  BP: (!) 152/76 (!) 121/47 119/62 (!) 157/73  Pulse: (!) 107 97 98   Resp: '16 20 20 20  '$ Temp: 99.7 F (37.6 C) 99.4 F (37.4 C) 98.9 F (37.2 C) 99 F (37.2 C)  TempSrc: Oral Oral Oral Oral  SpO2: 97% 96% 94% 97%  Weight:      Height:        Intake/Output Summary (Last 24 hours) at 06/06/2022 1215 Last data filed at 06/06/2022 5852 Gross per 24 hour  Intake 620.9 ml  Output --  Net 620.9 ml    Filed Weights   06/04/22 1114  Weight: 122.5 kg    Physical Examination: Body mass index is 35.62 kg/m.   General: Obese built, not in obvious distress HENT:   No scleral pallor or icterus noted. Oral mucosa is moist.  Chest:  Clear breath sounds.  Diminished breath sounds bilaterally. No crackles or wheezes.  CVS: S1 &S2 heard. No murmur.  Regular rate and rhythm. Abdomen: Soft, nontender, nondistended.  Bowel sounds are heard.   Extremities: No cyanosis, clubbing with right lower extremity edema, previous scar from surgery Psych: Alert, awake and oriented, normal mood CNS:  No cranial nerve deficits.  Moves extremities. Skin: Warm and dry.  No rashes noted.  Data Reviewed:   CBC: Recent Labs  Lab 06/04/22 1318 06/05/22 0406 06/05/22 1754  WBC 28.3* 29.0* 25.0*  NEUTROABS 25.0*  --   --   HGB 10.1* 10.1* 10.0*  HCT 33.6* 31.3* 31.1*  MCV 86.6 81.3 79.9*  PLT 372 427* 440*     Basic Metabolic Panel: Recent Labs  Lab 06/04/22 1318 06/05/22 0406 06/06/22 0426  NA 137 134* 136  K 3.1* 3.3* 3.4*  CL 104 101 106  CO2 21* 24 22  GLUCOSE 200* 204* 154*  BUN '11 14 15  '$ CREATININE 1.14 1.30* 1.23  CALCIUM 8.3* 8.4* 8.7*  MG 1.5*  --  1.8  PHOS 2.0*  --   --      Liver Function Tests: No  results for input(s): "AST", "ALT", "ALKPHOS", "BILITOT", "PROT", "ALBUMIN" in the last 168 hours.   Radiology Studies: MR Lumbar Spine W Wo Contrast  Result Date: 06/04/2022 CLINICAL DATA:  History of vertebral osteomyelitis in August 2023, status post laminectomy EXAM: MRI LUMBAR SPINE WITHOUT AND WITH CONTRAST TECHNIQUE: Multiplanar and multiecho pulse sequences of the lumbar spine were obtained without and with intravenous contrast. CONTRAST:  36m GADAVIST GADOBUTROL 1 MMOL/ML IV SOLN COMPARISON:  No prior MRI available FINDINGS: Evaluation is somewhat limited by motion and by the inability to link the pre and postcontrast sequences. Segmentation: Transitional anatomy at L5, with partial sacralization on the right. The last fully formed disc space is labeled  L5-S1 Alignment:  Mild levocurvature.  No significant listhesis. Vertebrae: No acute fracture; the vertebral body heights are preserved. Congenitally short pedicles, which narrow the AP diameter of the spinal canal. Mildly increased T2 signal and contrast enhancement at the posterosuperior aspect of L3 (series 7, image 10 and series 16, image 13). Mildly increased T2 signal in the L2-L3 disc space. Abnormal enhancement is also noted in the left transverse process distally at L3 (series 16, image 21 and series 7, image 19). Prior laminectomy at L2-L3 and L3-L4, with enhancing material in the laminectomy defect (series 17, image 19 and 25). Conus medullaris and cauda equina: Conus extends to the L1 level. Conus and cauda equina appear normal. No definite abnormal enhancement. Paraspinal and other soft tissues: Fluid collection in the superficial soft tissues, extending from the presumed incision line (series 10, image 16) into the subcutaneous fat, the main component of which measures up to 1.6 x 2.7 x 2.5 cm (series 5, image 14 and series 10, image 16). A tract extends anteriorly (series 7, images 11-15) and may reach the posterior aspect thecal sac  at L2-L3. There are multiple foci of susceptibility within the collection, which may represent air within the collection (series 10, images 18 and 24, for example). Diffuse enhancement within the para spinous musculature, most focally at L2-L4. No focal fluid collection within the musculature. Atrophy of the inferior paraspinous muscles. Disc levels: T12-L1: No significant disc bulge. Mild facet arthropathy. No spinal canal stenosis or neural foraminal narrowing. L1-L2: Minimal disc bulge. Mild facet arthropathy. Epidural lipomatosis. Moderate spinal canal stenosis. No neural foraminal narrowing. L2-L3: Disc height loss with mild disc bulge and superimposed central/left paracentral disc protrusion. Moderate facet arthropathy. Moderate to severe spinal canal stenosis. Moderate bilateral neural foraminal narrowing. L3-L4: Mild disc bulge. Moderate facet arthropathy. Moderate to severe spinal canal stenosis. Moderate bilateral neural foraminal narrowing. L4-L5: Mild disc bulge. Moderate facet arthropathy. Narrowing of the lateral recesses. No spinal canal stenosis. Moderate left and mild right neural foraminal narrowing. L5-S1: Small central disc protrusion. Mild facet arthropathy. No spinal canal stenosis or neural foraminal narrowing. IMPRESSION: 1. Evaluation is somewhat limited by motion and by the inability to link the pre and postcontrast sequences. Within this limitation, enhancing material is noted in the laminectomy defects at L2-L3 and L3-L4, which may represent granulation tissue although infection cannot be excluded. 2. Small fluid collection in the superficial soft tissues, extending from the presumed the incision site through the subcutaneous fat and muscle and likely extending into the posterior aspect of thecal sac at L2-L3. The collection contains multiple foci suspicious for air, and while it could represent a postoperative seroma or hematoma with surrounding inflammatory changes, an infected  collection is suspected. 3. Increased T2 signal and enhancement in the posterosuperior aspect of L3, as well as in the distal left transverse process of L3, which is concerning for osteomyelitis. 4. L2-L3 and L3-L4 moderate to severe spinal canal stenosis and moderate bilateral neural foraminal narrowing. 5. L1-L2 moderate spinal canal stenosis. 6. L4-L5 moderate left and mild right neural foraminal narrowing. Narrowing of the lateral recesses at this level could affect the descending L5 nerve roots. These results will be called to the ordering clinician or representative by the Radiologist Assistant, and communication documented in the PACS or [employer]. Electronically Signed   By: Merilyn Baba M.D.   On: 06/04/2022 21:05   CT FOOT RIGHT W CONTRAST  Result Date: 06/04/2022 CLINICAL DATA:  Osteomyelitis suspected, foot, xray done; Osteomyelitis  suspected, ankle, xray done EXAM: CT OF THE LOWER RIGHT FOOT AND ANKLE WITH CONTRAST TECHNIQUE: Multidetector CT imaging of the lower right extremity was performed according to the standard protocol following intravenous contrast administration. RADIATION DOSE REDUCTION: This exam was performed according to the departmental dose-optimization program which includes automated exposure control, adjustment of the mA and/or kV according to patient size and/or use of iterative reconstruction technique. CONTRAST:  144m OMNIPAQUE IOHEXOL 300 MG/ML  SOLN COMPARISON:  X-ray 06/04/2022 FINDINGS: Bones/Joint/Cartilage Postsurgical changes from a previous tibiotalocalcaneal arthrodesis. Circumferential perihardware lucency throughout the distal most screw within the calcaneus and cuboid. Thin perihardware lucency adjacent to the rod within the tibia and calcaneus. No hardware fracture. Fragmentation of the talus with solid tibiotalar arthrodesis along the anteromedial aspect of the joint. There is also fragmentation of the navicular. No bony bridging across the subtalar  joints. Pes planovalgus alignment with findings of subfibular impingement. Nonspecific capsular thickening of the ankle, which may be postsurgical and posttraumatic. There are multiple foci of air within the tibiotalar joint space. No evidence of acute fracture or dislocation. Ligaments Suboptimally assessed by CT. Muscles and Tendons Tibialis anterior tendon is ill-defined at the level of the ankle. Generalized muscle atrophy. Soft tissues Diffuse soft tissue swelling and edema. No organized or rim enhancing fluid collections. IMPRESSION: 1. Diffuse capsular thickening of the ankle joint with several foci of gas in the tibiotalar joint space. Appearance is highly suspicious for septic arthritis. Orthopedic surgery consultation and joint fluid sampling is recommended. 2. Postsurgical changes from a previous tibiotalocalcaneal arthrodesis. Circumferential perihardware lucency throughout the distal most screw within the calcaneus and cuboid, concerning for loosening. 3. Diffuse soft tissue swelling and edema without organized or rim enhancing fluid collections. Electronically Signed   By: NDavina PokeD.O.   On: 06/04/2022 16:13   CT ANKLE RIGHT W CONTRAST  Result Date: 06/04/2022 CLINICAL DATA:  Osteomyelitis suspected, foot, xray done; Osteomyelitis suspected, ankle, xray done EXAM: CT OF THE LOWER RIGHT FOOT AND ANKLE WITH CONTRAST TECHNIQUE: Multidetector CT imaging of the lower right extremity was performed according to the standard protocol following intravenous contrast administration. RADIATION DOSE REDUCTION: This exam was performed according to the departmental dose-optimization program which includes automated exposure control, adjustment of the mA and/or kV according to patient size and/or use of iterative reconstruction technique. CONTRAST:  1032mOMNIPAQUE IOHEXOL 300 MG/ML  SOLN COMPARISON:  X-ray 06/04/2022 FINDINGS: Bones/Joint/Cartilage Postsurgical changes from a previous tibiotalocalcaneal  arthrodesis. Circumferential perihardware lucency throughout the distal most screw within the calcaneus and cuboid. Thin perihardware lucency adjacent to the rod within the tibia and calcaneus. No hardware fracture. Fragmentation of the talus with solid tibiotalar arthrodesis along the anteromedial aspect of the joint. There is also fragmentation of the navicular. No bony bridging across the subtalar joints. Pes planovalgus alignment with findings of subfibular impingement. Nonspecific capsular thickening of the ankle, which may be postsurgical and posttraumatic. There are multiple foci of air within the tibiotalar joint space. No evidence of acute fracture or dislocation. Ligaments Suboptimally assessed by CT. Muscles and Tendons Tibialis anterior tendon is ill-defined at the level of the ankle. Generalized muscle atrophy. Soft tissues Diffuse soft tissue swelling and edema. No organized or rim enhancing fluid collections. IMPRESSION: 1. Diffuse capsular thickening of the ankle joint with several foci of gas in the tibiotalar joint space. Appearance is highly suspicious for septic arthritis. Orthopedic surgery consultation and joint fluid sampling is recommended. 2. Postsurgical changes from a previous tibiotalocalcaneal arthrodesis. Circumferential perihardware  lucency throughout the distal most screw within the calcaneus and cuboid, concerning for loosening. 3. Diffuse soft tissue swelling and edema without organized or rim enhancing fluid collections. Electronically Signed   By: Davina Poke D.O.   On: 06/04/2022 16:13      LOS: 2 days   Flora Lipps, MD [facility] Available via Epic secure chat 7am-7pm After these hours, please refer to coverage provider listed on [email or web address] 06/06/2022, 12:15 PM

## 2022-06-06 NOTE — ED Notes (Signed)
The pt has been transported to [facility], the pt is aox4 in no acute visible distress at the time of departure.

## 2022-06-06 NOTE — ED Notes (Signed)
Called [facility] ([name]) to inquire about pt bed status. Pt remains on wait list per transfer center.

## 2022-06-06 NOTE — TOC Initial Note (Signed)
Transition of Care (TOC) - Initial/Assessment Note    Patient Details  Name: Rickey Glover. MRN: [id number] Date of Birth: 1960/10/14  Transition of Care [facility]) CM/SW Contact:    Shelbie Hutching, RN Phone Number: 06/06/2022, 10:58 AM  Clinical Narrative:                  Transition of Care [facility]) Screening Note   Patient Details  Name: Rickey Glover. Date of Birth: 1961-05-15   Transition of Care [facility]) CM/SW Contact:    Shelbie Hutching, RN Phone Number: 06/06/2022, 10:58 AM    Transition of Care Department [facility]) has reviewed patient and no TOC needs have been identified at this time. We will continue to monitor patient advancement through interdisciplinary progression rounds. If new patient transition needs arise, please place a TOC consult.          Patient Goals and CMS Choice        Expected Discharge Plan and Services           Expected Discharge Date: 06/04/22                                    Prior Living Arrangements/Services                       Activities of Daily Living      Permission Sought/Granted                  Emotional Assessment              Admission diagnosis:  Right foot infection [L08.9] Patient Active Problem List   Diagnosis Date Noted   Right foot infection 06/04/2022   Hypokalemia 06/04/2022   MSSA bacteremia 06/04/2022   Chest pain 05/12/2017   Sebaceous cyst 06/30/2016   Multiple pulmonary nodules 01/13/2016   Adrenal adenoma 01/13/2016   CAD (coronary artery disease) 01/13/2016   Controlled substance agreement signed 10/27/2015   Hypertrophic toenail 10/08/2015   Right hip pain 10/08/2015   Special screening for malignant neoplasms, colon    Chronic pain 06/30/2015   Tobacco abuse 06/07/2015   Amaurosis fugax of left eye 06/07/2015   OSA (obstructive sleep apnea) 06/07/2015   Insomnia 04/30/2015   Leukocytosis 04/29/2015   Type 2 diabetes mellitus with  hyperglycemia, without long-term current use of insulin ([place]) 04/27/2015   Right knee pain 04/27/2015   DDD (degenerative disc disease), lumbar 04/27/2015   Essential hypertension 04/27/2015   HLD (hyperlipidemia) 04/27/2015   Obesity 04/27/2015   Bodies, loose, joint, knee 02/26/2015   Degenerative arthritis of hip 02/01/2015   Hallux abductovalgus with bunions 06/16/2014   Hook nail 06/16/2014   Fungal infection of nail 06/16/2014   Foot pain 06/16/2014   Lacunar infarction ([place]) 05/25/2014   Lumbar radiculopathy 05/08/2014   Morbid (severe) obesity due to excess calories ([place]) 02/05/2014   ED (erectile dysfunction) of organic origin 12/16/2013   History of surgical procedure 10/06/2013   Arthritis of knee, degenerative 08/12/2012   Lumbar canal stenosis 06/04/2012   Congestive heart failure ([place]) 02/12/2012   Chronic pancreatitis ([place]) 02/05/2012   PCP:  Alford Highland, RN Pharmacy:   [facility] [phone] [address], [address] AT [address] [place] [address] Phone: [phone] Fax: [id number] -  [place], [place] [address]  [place] [address] Phone: [phone] Fax: [phone]     Social Determinants of Health (SDOH) Interventions    Readmission Risk Interventions     No data to display

## 2022-06-07 NOTE — Progress Notes (Signed)
Pharmacy - Antimicrobial Stewardship  Called [place] [id number] and spoke to [place], [place].  Informed of MSSA in the blood culture collected prior to transfer.  She will inform team.   Doreene Eland, PharmD, BCPS, BCIDP Work Cell: [phone] 06/07/2022 8:15 AM

## 2022-06-09 LAB — CULTURE, BLOOD (ROUTINE X 2): Special Requests: ADEQUATE

## 2022-06-10 LAB — CULTURE, BLOOD (ROUTINE X 2): Special Requests: ADEQUATE
